# Patient Record
Sex: Female | Born: 1965 | Race: White | Hispanic: No | Marital: Single | Smoking: Former smoker
Health system: Southern US, Community
[De-identification: ages and names within clinical notes are randomized; demographics above are authoritative.]

## PROBLEM LIST (undated history)

## (undated) DIAGNOSIS — F419 Anxiety disorder, unspecified: Secondary | ICD-10-CM

## (undated) DIAGNOSIS — C801 Malignant (primary) neoplasm, unspecified: Secondary | ICD-10-CM

## (undated) DIAGNOSIS — N189 Chronic kidney disease, unspecified: Secondary | ICD-10-CM

## (undated) DIAGNOSIS — Z803 Family history of malignant neoplasm of breast: Secondary | ICD-10-CM

## (undated) DIAGNOSIS — N879 Dysplasia of cervix uteri, unspecified: Secondary | ICD-10-CM

## (undated) DIAGNOSIS — R87619 Unspecified abnormal cytological findings in specimens from cervix uteri: Secondary | ICD-10-CM

## (undated) DIAGNOSIS — Z1371 Encounter for nonprocreative screening for genetic disease carrier status: Secondary | ICD-10-CM

## (undated) DIAGNOSIS — E063 Autoimmune thyroiditis: Secondary | ICD-10-CM

## (undated) DIAGNOSIS — J45909 Unspecified asthma, uncomplicated: Secondary | ICD-10-CM

## (undated) DIAGNOSIS — T7840XA Allergy, unspecified, initial encounter: Secondary | ICD-10-CM

## (undated) DIAGNOSIS — Z800 Family history of malignant neoplasm of digestive organs: Secondary | ICD-10-CM

## (undated) DIAGNOSIS — E039 Hypothyroidism, unspecified: Secondary | ICD-10-CM

## (undated) DIAGNOSIS — J189 Pneumonia, unspecified organism: Secondary | ICD-10-CM

## (undated) HISTORY — DX: Dysplasia of cervix uteri, unspecified: N87.9

## (undated) HISTORY — DX: Malignant (primary) neoplasm, unspecified: C80.1

## (undated) HISTORY — DX: Hypothyroidism, unspecified: E03.9

## (undated) HISTORY — DX: Chronic kidney disease, unspecified: N18.9

## (undated) HISTORY — PX: COLPOSCOPY: SHX161

## (undated) HISTORY — DX: Family history of malignant neoplasm of digestive organs: Z80.0

## (undated) HISTORY — DX: Family history of malignant neoplasm of breast: Z80.3

## (undated) HISTORY — PX: ADENOIDECTOMY: SHX5191

## (undated) HISTORY — PX: CERVIX LESION DESTRUCTION: SHX591

## (undated) HISTORY — DX: Autoimmune thyroiditis: E06.3

## (undated) HISTORY — DX: Allergy, unspecified, initial encounter: T78.40XA

## (undated) HISTORY — DX: Encounter for nonprocreative screening for genetic disease carrier status: Z13.71

## (undated) HISTORY — DX: Unspecified asthma, uncomplicated: J45.909

## (undated) HISTORY — DX: Anxiety disorder, unspecified: F41.9

## (undated) HISTORY — DX: Unspecified abnormal cytological findings in specimens from cervix uteri: R87.619

## (undated) HISTORY — DX: Pneumonia, unspecified organism: J18.9

---

## 2000-04-22 ENCOUNTER — Encounter: Admission: RE | Admit: 2000-04-22 | Discharge: 2000-04-22 | Payer: Self-pay | Admitting: Internal Medicine

## 2000-04-22 ENCOUNTER — Encounter: Payer: Self-pay | Admitting: Internal Medicine

## 2000-12-05 ENCOUNTER — Encounter: Admission: RE | Admit: 2000-12-05 | Discharge: 2000-12-05 | Payer: Self-pay | Admitting: Cardiology

## 2000-12-05 ENCOUNTER — Encounter: Payer: Self-pay | Admitting: Family Medicine

## 2001-05-05 ENCOUNTER — Encounter: Admission: RE | Admit: 2001-05-05 | Discharge: 2001-05-05 | Payer: Self-pay | Admitting: Family Medicine

## 2001-05-05 ENCOUNTER — Encounter: Payer: Self-pay | Admitting: Family Medicine

## 2001-08-08 ENCOUNTER — Other Ambulatory Visit: Admission: RE | Admit: 2001-08-08 | Discharge: 2001-08-08 | Payer: Self-pay | Admitting: *Deleted

## 2003-06-04 ENCOUNTER — Encounter: Admission: RE | Admit: 2003-06-04 | Discharge: 2003-06-04 | Payer: Self-pay | Admitting: Family Medicine

## 2004-06-04 ENCOUNTER — Encounter: Admission: RE | Admit: 2004-06-04 | Discharge: 2004-06-04 | Payer: Self-pay | Admitting: Family Medicine

## 2004-11-06 ENCOUNTER — Other Ambulatory Visit: Admission: RE | Admit: 2004-11-06 | Discharge: 2004-11-06 | Payer: Self-pay | Admitting: Gynecology

## 2005-06-07 ENCOUNTER — Encounter: Admission: RE | Admit: 2005-06-07 | Discharge: 2005-06-07 | Payer: Self-pay | Admitting: Family Medicine

## 2005-08-04 ENCOUNTER — Emergency Department (HOSPITAL_COMMUNITY): Admission: EM | Admit: 2005-08-04 | Discharge: 2005-08-04 | Payer: Self-pay | Admitting: Emergency Medicine

## 2005-08-05 ENCOUNTER — Encounter: Payer: Self-pay | Admitting: Vascular Surgery

## 2005-08-05 ENCOUNTER — Ambulatory Visit (HOSPITAL_COMMUNITY): Admission: RE | Admit: 2005-08-05 | Discharge: 2005-08-05 | Payer: Self-pay | Admitting: Family Medicine

## 2005-08-07 ENCOUNTER — Encounter: Admission: RE | Admit: 2005-08-07 | Discharge: 2005-08-07 | Payer: Self-pay | Admitting: Orthopedic Surgery

## 2006-01-25 ENCOUNTER — Other Ambulatory Visit: Admission: RE | Admit: 2006-01-25 | Discharge: 2006-01-25 | Payer: Self-pay | Admitting: Gynecology

## 2006-07-29 ENCOUNTER — Encounter: Admission: RE | Admit: 2006-07-29 | Discharge: 2006-07-29 | Payer: Self-pay | Admitting: Family Medicine

## 2007-09-13 ENCOUNTER — Encounter: Admission: RE | Admit: 2007-09-13 | Discharge: 2007-09-13 | Payer: Self-pay | Admitting: Family Medicine

## 2007-10-07 DIAGNOSIS — Z1371 Encounter for nonprocreative screening for genetic disease carrier status: Secondary | ICD-10-CM

## 2007-10-07 HISTORY — DX: Encounter for nonprocreative screening for genetic disease carrier status: Z13.71

## 2007-10-23 ENCOUNTER — Other Ambulatory Visit: Admission: RE | Admit: 2007-10-23 | Discharge: 2007-10-23 | Payer: Self-pay | Admitting: Gynecology

## 2007-11-14 ENCOUNTER — Ambulatory Visit: Payer: Self-pay | Admitting: Gynecology

## 2008-09-11 ENCOUNTER — Ambulatory Visit: Payer: Self-pay | Admitting: Gynecology

## 2008-10-18 ENCOUNTER — Ambulatory Visit: Payer: Self-pay | Admitting: Gynecology

## 2010-03-06 ENCOUNTER — Ambulatory Visit: Payer: Self-pay | Admitting: Women's Health

## 2010-03-09 ENCOUNTER — Other Ambulatory Visit
Admission: RE | Admit: 2010-03-09 | Discharge: 2010-03-09 | Payer: Self-pay | Source: Home / Self Care | Admitting: Gynecology

## 2010-10-29 ENCOUNTER — Other Ambulatory Visit: Payer: Self-pay

## 2010-10-29 ENCOUNTER — Ambulatory Visit: Payer: Self-pay | Admitting: Women's Health

## 2010-10-29 DIAGNOSIS — E038 Other specified hypothyroidism: Secondary | ICD-10-CM | POA: Insufficient documentation

## 2010-10-29 DIAGNOSIS — R87619 Unspecified abnormal cytological findings in specimens from cervix uteri: Secondary | ICD-10-CM | POA: Insufficient documentation

## 2011-02-22 ENCOUNTER — Telehealth: Payer: Self-pay | Admitting: *Deleted

## 2011-02-22 NOTE — Telephone Encounter (Signed)
Pt called complaining of foul vaginal odor & yeast  and would like to know what could be done. Pt has OV on 02/24/11 to seen [place]. Pt informed she could try OTC to help relieve, but should keep appt. So [place] can run culture. Pt agrees

## 2011-02-24 ENCOUNTER — Encounter: Payer: Self-pay | Admitting: Women's Health

## 2011-02-24 ENCOUNTER — Ambulatory Visit (INDEPENDENT_AMBULATORY_CARE_PROVIDER_SITE_OTHER): Payer: BC Managed Care – PPO | Admitting: Women's Health

## 2011-02-24 DIAGNOSIS — A499 Bacterial infection, unspecified: Secondary | ICD-10-CM

## 2011-02-24 DIAGNOSIS — R1032 Left lower quadrant pain: Secondary | ICD-10-CM

## 2011-02-24 DIAGNOSIS — R82998 Other abnormal findings in urine: Secondary | ICD-10-CM

## 2011-02-24 DIAGNOSIS — B9689 Other specified bacterial agents as the cause of diseases classified elsewhere: Secondary | ICD-10-CM

## 2011-02-24 DIAGNOSIS — E039 Hypothyroidism, unspecified: Secondary | ICD-10-CM

## 2011-02-24 DIAGNOSIS — B3731 Acute candidiasis of vulva and vagina: Secondary | ICD-10-CM

## 2011-02-24 DIAGNOSIS — N760 Acute vaginitis: Secondary | ICD-10-CM

## 2011-02-24 DIAGNOSIS — B373 Candidiasis of vulva and vagina: Secondary | ICD-10-CM

## 2011-02-24 MED ORDER — METRONIDAZOLE 0.75 % VA GEL: VAGINAL | Status: AC

## 2011-02-24 MED ORDER — LEVOTHYROXINE SODIUM 200 MCG PO TABS: 200.0000 ug | ORAL_TABLET | Freq: Every day | ORAL | Status: DC

## 2011-02-24 MED ORDER — FLUCONAZOLE 150 MG PO TABS: 150.0000 mg | ORAL_TABLET | Freq: Once | ORAL | Status: AC

## 2011-02-24 NOTE — Progress Notes (Signed)
Patient ID: Samantha Golden, female   DOB: 10-24-1965, 45 y.o.   MRN: [id number] Presents with a complaint of discharge with an odor and irritation for greater than one week. Also states has had some left lower quadrant cramping. Denies any urinary symptoms, changes in elimination or fever. Light monthly cycle with a Mirena IUD. Same partner.  Exam: UA: 4-6 WBCs and +1 bacteria. External genitalia within normal limits, speculum exam scant amount of a watery discharge with an odor was noted. IUD strings visible. Bimanual no CMT or adnexal fullness or tenderness on the right, slight tenderness on the lower left quadrant. Wet prep positive for yeast, amines, clue cells.   BV  Plan: MetroGel vaginal cream 1 applicator at bedtime x5. Diflucan 150 by mouth x1 dose with a refill. Urine culture pending. Reviewed if low abdominal discomfort is not relieved after treating BV will return to the office for an ultrasound. Also instructed to call or return if discharge not resolved.

## 2011-03-08 ENCOUNTER — Other Ambulatory Visit: Payer: Self-pay | Admitting: Women's Health

## 2011-03-08 DIAGNOSIS — Z1231 Encounter for screening mammogram for malignant neoplasm of breast: Secondary | ICD-10-CM

## 2011-03-10 ENCOUNTER — Ambulatory Visit
Admission: RE | Admit: 2011-03-10 | Discharge: 2011-03-10 | Disposition: A | Discharge status: Home / Self Care | Payer: BC Managed Care – PPO | Source: Ambulatory Visit | Attending: Women's Health | Admitting: Women's Health

## 2011-03-10 DIAGNOSIS — Z1231 Encounter for screening mammogram for malignant neoplasm of breast: Secondary | ICD-10-CM

## 2011-04-28 ENCOUNTER — Telehealth: Payer: Self-pay

## 2011-04-28 NOTE — Telephone Encounter (Signed)
Pt requesting refill on her adderall  °

## 2011-04-28 NOTE — Telephone Encounter (Signed)
Can we refill pt's adderall?

## 2011-04-29 MED ORDER — AMPHETAMINE-DEXTROAMPHET ER 20 MG PO CP24: ORAL_CAPSULE | ORAL | Status: DC

## 2011-04-29 NOTE — Telephone Encounter (Signed)
I've authorized a 30 day supply, printed.  Please advise the patient that she'll need an OV for this for the next fill. csj

## 2011-04-29 NOTE — Telephone Encounter (Signed)
Chart pulled to clinical station

## 2011-04-29 NOTE — Telephone Encounter (Signed)
Chart please, then re-route to PA Pool.

## 2011-04-29 NOTE — Telephone Encounter (Signed)
Called pt and advised RX ready to pick up

## 2011-06-15 ENCOUNTER — Ambulatory Visit (INDEPENDENT_AMBULATORY_CARE_PROVIDER_SITE_OTHER): Payer: BC Managed Care – PPO | Admitting: Gynecology

## 2011-06-15 ENCOUNTER — Encounter: Payer: Self-pay | Admitting: Gynecology

## 2011-06-15 DIAGNOSIS — R229 Localized swelling, mass and lump, unspecified: Secondary | ICD-10-CM

## 2011-06-15 DIAGNOSIS — R2230 Localized swelling, mass and lump, unspecified upper limb: Secondary | ICD-10-CM

## 2011-06-15 NOTE — Progress Notes (Signed)
Patient presents having felt a bump in her right axilla today. No history of this previously. She just had her mammogram in January which was normal. She does have a strong family history of breast cancer in her maternal grandmother and mother. She was tested for BRCA and was negative historically. Has an IUD with scant to absent menses.  Exam was Sherrilyn Rist chaperone present Breasts examined lying and sitting without masses retractions discharge adenopathy. She's had a hard time demonstrating. She previously felt. The area she's pointing to is in the mid anterior pectoralis line where I do not feel any definitive masses but a small granular faint area of that I think is probably a small sebaceous change.  Assessment and plan: Right axillary nodule initially felt by patient today over she cannot clearly demonstrated now. I do not feel any distinct abnormalities but a faint granular intradermal type changes I think is a small sebaceous process. I recommended she put heat to this area and reassess in one to 2 weeks if she still feels any changes whatsoever to call me and we'll start with ultrasound of the axillary area. If it totally resolves and she cannot feel any changes and we'll follow expectantly she is comfortable with this.

## 2011-06-15 NOTE — Patient Instructions (Addendum)
Call if you think you still feel anything in the right axilla and we will schedule ultrasound.

## 2011-07-28 ENCOUNTER — Other Ambulatory Visit: Payer: Self-pay

## 2011-07-28 NOTE — Telephone Encounter (Signed)
adderoll refill  Please call [phone]  When ready for pick up

## 2011-07-29 MED ORDER — AMPHETAMINE-DEXTROAMPHET ER 20 MG PO CP24: ORAL_CAPSULE | ORAL | Status: DC

## 2011-07-29 NOTE — Telephone Encounter (Signed)
Adderall XR 20mg  1-2 QAM, QPM Chart at pa desk for review.  [id number]

## 2011-07-29 NOTE — Telephone Encounter (Signed)
Rx printed.  Please advise patient she needs OV for next fill.

## 2011-07-29 NOTE — Telephone Encounter (Signed)
Spoke to patient. She will pick up script.

## 2011-12-10 ENCOUNTER — Ambulatory Visit: Payer: Self-pay | Admitting: Family Medicine

## 2012-03-09 ENCOUNTER — Encounter: Payer: BC Managed Care – PPO | Admitting: Gynecology

## 2012-03-10 ENCOUNTER — Encounter: Payer: Self-pay | Admitting: Gynecology

## 2012-03-10 ENCOUNTER — Ambulatory Visit (INDEPENDENT_AMBULATORY_CARE_PROVIDER_SITE_OTHER): Payer: BC Managed Care – PPO | Admitting: Gynecology

## 2012-03-10 ENCOUNTER — Other Ambulatory Visit (HOSPITAL_COMMUNITY)
Admission: RE | Admit: 2012-03-10 | Discharge: 2012-03-10 | Disposition: A | Discharge status: Home / Self Care | Payer: BC Managed Care – PPO | Source: Ambulatory Visit | Attending: Gynecology | Admitting: Gynecology

## 2012-03-10 VITALS — BP 124/70 | Ht 66.0 in | Wt 162.0 lb

## 2012-03-10 DIAGNOSIS — Z13220 Encounter for screening for lipoid disorders: Secondary | ICD-10-CM

## 2012-03-10 DIAGNOSIS — N879 Dysplasia of cervix uteri, unspecified: Secondary | ICD-10-CM | POA: Insufficient documentation

## 2012-03-10 DIAGNOSIS — E039 Hypothyroidism, unspecified: Secondary | ICD-10-CM

## 2012-03-10 DIAGNOSIS — Z1151 Encounter for screening for human papillomavirus (HPV): Secondary | ICD-10-CM | POA: Insufficient documentation

## 2012-03-10 DIAGNOSIS — Z01419 Encounter for gynecological examination (general) (routine) without abnormal findings: Secondary | ICD-10-CM

## 2012-03-10 LAB — CBC WITH DIFFERENTIAL/PLATELET
Basophils Relative: 0 % (ref 0–1)
Eosinophils Absolute: 0.4 10*3/uL (ref 0.0–0.7)
Eosinophils Relative: 4 % (ref 0–5)
HCT: 47.4 % — ABNORMAL HIGH (ref 36.0–46.0)
Lymphocytes Relative: 22 % (ref 12–46)
Lymphs Abs: 2 10*3/uL (ref 0.7–4.0)
MCV: 99.6 fL (ref 78.0–100.0)
Monocytes Absolute: 0.7 10*3/uL (ref 0.1–1.0)
Neutro Abs: 6.1 10*3/uL (ref 1.7–7.7)
Neutrophils Relative %: 66 % (ref 43–77)
RDW: 13.4 % (ref 11.5–15.5)

## 2012-03-10 LAB — COMPREHENSIVE METABOLIC PANEL
ALT: 16 U/L (ref 0–35)
Albumin: 4.6 g/dL (ref 3.5–5.2)
BUN: 11 mg/dL (ref 6–23)
CO2: 28 mEq/L (ref 19–32)
Calcium: 9.4 mg/dL (ref 8.4–10.5)
Creat: 0.72 mg/dL (ref 0.50–1.10)
Glucose, Bld: 64 mg/dL — ABNORMAL LOW (ref 70–99)
Sodium: 139 mEq/L (ref 135–145)
Total Bilirubin: 0.4 mg/dL (ref 0.3–1.2)

## 2012-03-10 LAB — LIPID PANEL
Cholesterol: 250 mg/dL — ABNORMAL HIGH (ref 0–200)
Total CHOL/HDL Ratio: 3.8 Ratio
VLDL: 41 mg/dL — ABNORMAL HIGH (ref 0–40)

## 2012-03-10 NOTE — Progress Notes (Signed)
Samantha Golden 01-Jul-1965 [id number]        47 y.o.  G3P3003 for annual exam.  Several issues below.  Past medical history,surgical history, medications, allergies, family history and social history were all reviewed and documented in the EPIC chart. ROS:  Was performed and pertinent positives and negatives are included in the history.  Exam: Kim assistant Filed Vitals:   03/10/12 1610  BP: 124/70  Height: 5\' 6"  (1.676 m)  Weight: 162 lb (73.483 kg)   General appearance  Normal Skin grossly normal Head/Neck normal with no cervical or supraclavicular adenopathy thyroid normal Lungs  clear Cardiac RR, without RMG Abdominal  soft, nontender, without masses, organomegaly or hernia Breasts  examined lying and sitting without masses, retractions, discharge or axillary adenopathy. Pelvic  Ext/BUS/vagina  normal   Cervix  normal with IUD string visualized. Pap/HPV done  Uterus  anteverted, normal size, shape and contour, midline and mobile nontender   Adnexa  Without masses or tenderness    Anus and perineum  normal   Rectovaginal  normal sphincter tone without palpated masses or tenderness.    Assessment/Plan:  47 y.o. G46P3003 female for annual exam.   1. Family history of breast cancer. Patient with very strong family history of breast cancer to include her mother at age 62, maternal grandmother in her 61s and a paternal aunt all with breast cancer. The patient did have BRCA1 and 2 screening and was negative.  Mammography January 2013. Continue with annual mammography. SBE monthly reviewed.  Consider 3D tomosynthesis. 2. Pap smear January 2012. Pap/HPV done today. History of dysplasia number of years ago with normal recent Pap smears over the last number of years documented from 2003 in her chart. 3. Mirena IUD 09/2008. Without menses.  Significant weight gain hair changes and dry skin particularly over the past year she is wondering if it is IUD related. Also being followed for  hypothyroidism. Will check TSH. Options to remove the IUD and observe versus continuing IUD discussed. Alternatives for contraception to include combined hormonal Implanon Depo-Provera nonhormonal IUD sterilization post vasectomy tubal and Essure all discussed. She did have heavy painful periods previously and is fearful that these will return if she has the IUD removed. She is unsure what she wants to do at this point and wants to leave the IUD in place for now. 4. Hypothyroidism. Check TSH today. 5. Health maintenance. Check baseline CBC comprehensive metabolic panel TSH lipid profile urinalysis.    Dara Lords MD, 4:56 PM 03/10/2012

## 2012-03-10 NOTE — Addendum Note (Signed)
Addended by: Dayna Barker on: 03/10/2012 05:04 PM   Modules accepted: Orders

## 2012-03-10 NOTE — Patient Instructions (Addendum)
Follow up for lab work and your decision about the IUD.

## 2012-03-11 LAB — URINALYSIS W MICROSCOPIC + REFLEX CULTURE
Bilirubin Urine: NEGATIVE
Casts: NONE SEEN
Glucose, UA: NEGATIVE mg/dL
Specific Gravity, Urine: 1.017 (ref 1.005–1.030)

## 2012-03-13 ENCOUNTER — Encounter: Payer: Self-pay | Admitting: Gynecology

## 2012-03-15 ENCOUNTER — Ambulatory Visit (INDEPENDENT_AMBULATORY_CARE_PROVIDER_SITE_OTHER): Payer: BC Managed Care – PPO | Admitting: Women's Health

## 2012-03-15 DIAGNOSIS — Z30431 Encounter for routine checking of intrauterine contraceptive device: Secondary | ICD-10-CM

## 2012-03-15 DIAGNOSIS — E039 Hypothyroidism, unspecified: Secondary | ICD-10-CM

## 2012-03-15 MED ORDER — LEVOTHYROXINE SODIUM 25 MCG PO TABS: 25.0000 ug | ORAL_TABLET | Freq: Every day | ORAL | Status: DC

## 2012-03-15 MED ORDER — LEVOTHYROXINE SODIUM 200 MCG PO TABS: 200.0000 ug | ORAL_TABLET | Freq: Every day | ORAL | Status: AC

## 2012-03-15 NOTE — Progress Notes (Signed)
Patient ID: Samantha Golden, female   DOB: [birth year], 47 y.o.   MRN: [id number] Presents to have Mirena IUD removed has been in about 3-1/2 years. States has had increased weight gain and not felt as well. Rare light cycles. Currently on Synthroid 200 mcg, TSH 90.174 at annual exam last week. States missed several days. Lipid panel, cholesterol 250, triglycerides 2 or 3, HDL 65 and LDL 144. Partner 37 and does not want vasectomy.  Exam: Appears well, external genitalia within normal limits. Speculum exam cervix parous IUD strings visible, IUD removed intact shown to patient and discarded.  Contraception counseling Hypothyroid/? Compliance Hypercholesteremia  Plan: Labs reviewed.Will increase Synthroid to daily, recheck thyroid panel in 6 weeks. Reviewed importance of taking daily. Reviewed hypothyroidism can cause weight gain, skin changes and some of a problems she has had. States still wanted IUD removed. Also discussed lipid panel. Reviewed importance of increasing regular exercise, decreasing saturated fats to less than 20 g per day, adding a fish oil supplement and rechecking in 4-6 months after lifestyle changes. ParaGard IUD information given and reviewed slight risk for hemorrhage, infection, perforation. Reviewed it is hormone free, Dr. Audie Box will place with a cycle, condoms until. BTL's also discussed.

## 2012-08-16 ENCOUNTER — Telehealth: Payer: Self-pay | Admitting: Gynecology

## 2012-08-16 ENCOUNTER — Other Ambulatory Visit: Payer: Self-pay | Admitting: Gynecology

## 2012-08-16 ENCOUNTER — Other Ambulatory Visit: Payer: Self-pay

## 2012-08-16 DIAGNOSIS — Z3049 Encounter for surveillance of other contraceptives: Secondary | ICD-10-CM

## 2012-08-16 NOTE — Telephone Encounter (Signed)
08/16/12-Pt was informed that her [facility] ins covers the Paraguard IUD & insertion under the $81.00 copay. Appt set up with TF for insertion on 09/06/12-WL

## 2012-08-31 ENCOUNTER — Ambulatory Visit (INDEPENDENT_AMBULATORY_CARE_PROVIDER_SITE_OTHER): Payer: BC Managed Care – PPO | Admitting: Gynecology

## 2012-08-31 ENCOUNTER — Encounter: Payer: Self-pay | Admitting: Gynecology

## 2012-08-31 DIAGNOSIS — Z30431 Encounter for routine checking of intrauterine contraceptive device: Secondary | ICD-10-CM

## 2012-08-31 NOTE — Patient Instructions (Signed)
Intrauterine Device Insertion Most often, an intrauterine device (IUD) is inserted into the uterus to prevent pregnancy. There are 2 types of IUDs available:  Copper IUD. This type of IUD creates an environment that is not favorable to sperm survival. The mechanism of action of the copper IUD is not known for certain. It can stay in place for 10 years.  Hormone IUD. This type of IUD contains the hormone progestin (synthetic progesterone). The progestin thickens the cervical mucus and prevents sperm from entering the uterus, and it also thins the uterine lining. There is no evidence that the hormone IUD prevents implantation. The hormone IUD can stay in place for up to 5 years. An IUD is the most cost-effective birth control if left in place for the full duration. It may be removed at any time. LET YOUR CAREGIVER KNOW ABOUT:  Sensitivity to metals.  Medicines taken including herbs, eyedrops, over-the-counter medicines, and creams.  Use of steroids (by mouth or creams).  Previous problems with anesthetics or numbing medicine.  Previous gynecological surgery.  History of blood clots or clotting disorders.  Possibility of pregnancy.  Menstrual irregularities.  Concerns regarding unusual vaginal discharge or odors.  Previous experience with an IUD.  Other health problems. RISKS AND COMPLICATIONS  Accidental puncture (perforation) of the uterus.  Accidental placement of the IUD either in the muscle layer of the uterus (myometrium) or outside the uterus. If this happen, the IUD can be found essentially floating around the bowels. When this happens, the IUD must be taken out surgically.  The IUD may fall out of the uterus (expulsion). This is more common in women who have recently had a child.   Pregnancy in the fallopian tube (ectopic). BEFORE THE PROCEDURE  Schedule the IUD insertion for when you will have your menstrual period or right after, to make sure you are not pregnant.  Placement of the IUD is better tolerated shortly after a menstrual cycle.  You may need to take tests or be examined to make sure you are not pregnant.  You may be required to take a pregnancy test.  You may be required to get checked for sexually transmitted infections (STIs) prior to placement. Placing an IUD in someone who has an infection can make an infection worse.  You may be given a pain reliever to take 1 or 2 hours before the procedure.  An exam will be performed to determine the size and position of your uterus.  Ask your caregiver about changing or stopping your regular medicines. PROCEDURE   A tool (speculum) is placed in the vagina. This allows your caregiver to see the lower part of the uterus (cervix).  The cervix is prepped with a medicine that lowers the risk of infection.  You may be given a medicine to numb each side of the cervix (intracervical or paracervical block). This is used to block and control any discomfort with insertion.  A tool (uterine sound) is inserted into the uterus to determine the length of the uterine cavity and the direction the uterus may be tilted.  A slim instrument (IUD inserter) is inserted through the cervical canal and into your uterus.  The IUD is placed in the uterine cavity and the insertion device is removed.  The nylon string that is attached to the IUD, and used for eventual IUD removal, is trimmed. It is trimmed so that it lays high in the vagina, just outside the cervix. AFTER THE PROCEDURE  You may have bleeding after the   procedure. This is normal. It varies from light spotting for a few days to menstrual-like bleeding.  You may have mild cramping.  Practice checking the string coming out of the cervix to make sure the IUD remains in the uterus. If you cannot feel the string, you should schedule a "string check" with your caregiver.  If you had a hormone IUD inserted, expect that your period may be lighter or nonexistent  within a year's time (though this is not always the case). There may be delayed fertility with the hormone IUD as a result of its progesterone effect. When you are ready to become pregnant, it is suggested to have the IUD removed up to 1 year in advance.  Yearly exams are advised. Document Released: 10/21/2010 Document Revised: 05/17/2011 Document Reviewed: 10/21/2010 ExitCare Patient Information 2014 [employer].  

## 2012-08-31 NOTE — Progress Notes (Signed)
Patient presents for ParaGard IUD placement. She had her Mirena IUD removed by Harriett Sine due to suspected side effects. She does note that her sleep disturbances and weight gain seemed to be better. She does understand that her periods may be heavier and accepts this. She ultimately has elected for a ParaGard IUD. She has read through the booklet, has no contraindications. She currently is on a normal menses.  I reviewed the insertional process with her as well as the risks to include infection either immediate or long-term, uterine perforation or migration requiring surgery to remove, other complications such as pain and heavier menses and possibilities of failure with subsequent pregnancy.   Exam with  Kim assistant Pelvic: External BUS vagina normal. Cervix normal  with menses flow. Uterus  anteverted normal size shape contour midline mobile nontender. Adnexa without masses or tenderness.  Procedure: The cervix was cleansed with Betadine, anterior lip grasped with a single-tooth tenaculum, the uterus was sounded and a  ParaGard  was placed according to manufacturer's recommendations without difficulty. The strings were trimmed. The patient tolerated well and will follow up in one month for a postinsertional check.  Lot number:  [id number]

## 2012-09-06 ENCOUNTER — Ambulatory Visit: Payer: BC Managed Care – PPO | Admitting: Gynecology

## 2012-10-04 ENCOUNTER — Encounter: Payer: Self-pay | Admitting: Gynecology

## 2012-10-04 ENCOUNTER — Ambulatory Visit (INDEPENDENT_AMBULATORY_CARE_PROVIDER_SITE_OTHER): Payer: BC Managed Care – PPO | Admitting: Gynecology

## 2012-10-04 DIAGNOSIS — Z30431 Encounter for routine checking of intrauterine contraceptive device: Secondary | ICD-10-CM

## 2012-10-04 NOTE — Progress Notes (Signed)
Patient presents for IUD followup exam. Had ParaGard placed a month ago. Did have one menses which was heavy.  Exam was Berenice Bouton Abdomen soft nontender without masses guarding rebound organomegaly. Pelvic external BUS vagina normal. Cervix normal with IUD string visualized an appropriate length. Uterus normal size midline mobile nontender. Adnexa without masses or tenderness.  Assessment and plan: Normal IUD check. Patient will keep menstrual calendar. As long as acceptable then followup in January when she's due for her annual exam. Sooner if any issues.

## 2012-10-04 NOTE — Patient Instructions (Signed)
Followup in January 2015 for your annual exam. Sooner if any issues.

## 2012-11-22 ENCOUNTER — Other Ambulatory Visit: Payer: Self-pay | Admitting: Women's Health

## 2012-11-23 NOTE — Telephone Encounter (Signed)
Message left, I think she is on 225 mcg of Synthroid daily. Needs TSH also.

## 2012-11-23 NOTE — Telephone Encounter (Signed)
Left message at [facility] that [place] needs to speak with patient before refilling this.

## 2012-11-23 NOTE — Telephone Encounter (Signed)
Samantha Golden, Looks to me like you recommended she recheck TSH 6 weeks after her Jan 8 visit but I do not see where she ever did that.

## 2012-11-24 NOTE — Telephone Encounter (Signed)
Telephone call to patient she is on 225, 25 mcg pill needed refill she still has refills on the 200.

## 2013-04-18 ENCOUNTER — Ambulatory Visit: Payer: BC Managed Care – PPO | Admitting: Internal Medicine

## 2013-04-26 ENCOUNTER — Other Ambulatory Visit: Payer: Self-pay | Admitting: Women's Health

## 2013-04-27 ENCOUNTER — Other Ambulatory Visit: Payer: Self-pay | Admitting: Women's Health

## 2013-04-27 ENCOUNTER — Telehealth: Payer: Self-pay

## 2013-04-27 MED ORDER — LEVOTHYROXINE SODIUM 25 MCG PO TABS: ORAL_TABLET | ORAL | Status: DC

## 2013-04-27 NOTE — Telephone Encounter (Signed)
Patient informed. She declined being transferred to appt desk to schedule as she was in her car. I told her very important that she not forget to call back because this is last Rx refill. Overdue for annual and never had TSH rechecked last year to be sure dose correct.

## 2013-04-27 NOTE — Telephone Encounter (Signed)
Patient called because refill for Synthroid was denied.  You saw her for CE 03/11/12 so she is overdue for that but also on 03/15/12 Samantha Golden started her on Synthroid and told her to return in six weeks to recheck TSH and that was never done.  Patient said Samantha Golden would not want her to not be on it and she will schedule appointment if needed.  OK to refill until she can schedule CE?

## 2013-04-27 NOTE — Telephone Encounter (Signed)
rx sent

## 2013-04-27 NOTE — Telephone Encounter (Signed)
30 day supply only.  Needs to be seen within that time and we have plenty of openings.

## 2013-05-03 ENCOUNTER — Telehealth: Payer: Self-pay

## 2013-05-03 NOTE — Telephone Encounter (Signed)
Dr.Doolittle, Patient called stating she tried to make an appointment with him, She will like first appointment to see Dr. Laney Pastor for medication refill. [phone], Patient will like to a nurse to call or if Dr. Laney Pastor is unavailable she will see another doctor for her refill medication

## 2013-05-04 ENCOUNTER — Other Ambulatory Visit: Payer: Self-pay | Admitting: *Deleted

## 2013-05-04 NOTE — Telephone Encounter (Signed)
Pt stated that she needs a refill on her Adderall and she says her appt would be in April.  I advised her to call back on Monday to schedule appt but will you write her enough to last til she get in.

## 2013-05-07 NOTE — Telephone Encounter (Signed)
She has no note from [place] in epic We have not prescribed this for her and so can't refill it I suggest you make an appt for 1st available provider at [facility] who is willing to prescribe for her

## 2013-05-08 NOTE — Telephone Encounter (Signed)
Discussed with pt. She will call back to schedule appt with the first available provider.

## 2013-05-20 ENCOUNTER — Other Ambulatory Visit: Payer: Self-pay | Admitting: Women's Health

## 2013-05-21 NOTE — Telephone Encounter (Signed)
Pt due for annual exam she will be called to schedule. KW CMA

## 2013-06-28 ENCOUNTER — Ambulatory Visit: Payer: BC Managed Care – PPO

## 2013-06-28 ENCOUNTER — Ambulatory Visit (INDEPENDENT_AMBULATORY_CARE_PROVIDER_SITE_OTHER): Payer: BC Managed Care – PPO | Admitting: Family Medicine

## 2013-06-28 ENCOUNTER — Ambulatory Visit: Payer: Self-pay

## 2013-06-28 ENCOUNTER — Encounter: Payer: Self-pay | Admitting: Family Medicine

## 2013-06-28 VITALS — BP 120/82 | HR 62 | Temp 97.9°F | Resp 16 | Ht 66.0 in | Wt 154.0 lb

## 2013-06-28 DIAGNOSIS — M25511 Pain in right shoulder: Secondary | ICD-10-CM

## 2013-06-28 DIAGNOSIS — F900 Attention-deficit hyperactivity disorder, predominantly inattentive type: Secondary | ICD-10-CM

## 2013-06-28 DIAGNOSIS — F988 Other specified behavioral and emotional disorders with onset usually occurring in childhood and adolescence: Secondary | ICD-10-CM

## 2013-06-28 DIAGNOSIS — M25519 Pain in unspecified shoulder: Secondary | ICD-10-CM

## 2013-06-28 MED ORDER — AMPHETAMINE-DEXTROAMPHET ER 20 MG PO CP24: ORAL_CAPSULE | ORAL | Status: DC

## 2013-06-28 MED ORDER — IBUPROFEN 600 MG PO TABS: 600.0000 mg | ORAL_TABLET | Freq: Three times a day (TID) | ORAL | Status: DC | PRN

## 2013-06-28 MED ORDER — TRAMADOL HCL 50 MG PO TABS: ORAL_TABLET | ORAL | Status: DC

## 2013-06-28 NOTE — Patient Instructions (Signed)
   Shoulder Pain The shoulder is the joint that connects your arms to your body. The bones that form the shoulder joint include the upper arm bone (humerus), the shoulder blade (scapula), and the collarbone (clavicle). The top of the humerus is shaped like a ball and fits into a rather flat socket on the scapula (glenoid cavity). A combination of muscles and strong, fibrous tissues that connect muscles to bones (tendons) support your shoulder joint and hold the ball in the socket. Small, fluid-filled sacs (bursae) are located in different areas of the joint. They act as cushions between the bones and the overlying soft tissues and help reduce friction between the gliding tendons and the bone as you move your arm. Your shoulder joint allows a wide range of motion in your arm. This range of motion allows you to do things like scratch your back or throw a ball. However, this range of motion also makes your shoulder more prone to pain from overuse and injury. Causes of shoulder pain can originate from both injury and overuse and usually can be grouped in the following four categories:  Redness, swelling, and pain (inflammation) of the tendon (tendinitis) or the bursae (bursitis).  Instability, such as a dislocation of the joint.  Inflammation of the joint (arthritis).  Broken bone (fracture). HOME CARE INSTRUCTIONS   Apply ice to the sore area.  Put ice in a plastic bag.  Place a towel between your skin and the bag.  Leave the ice on for 15-20 minutes, 03-04 times per day for the first 2 days.  Stop using cold packs if they do not help with the pain.  If you have a shoulder sling or immobilizer, wear it as long as your caregiver instructs. Only remove it to shower or bathe. Move your arm as little as possible, but keep your hand moving to prevent swelling.  Squeeze a soft ball or foam pad as much as possible to help prevent swelling.  Only take over-the-counter or prescription medicines for  pain, discomfort, or fever as directed by your caregiver. SEEK MEDICAL CARE IF:   Your shoulder pain increases, or new pain develops in your arm, hand, or fingers.  Your hand or fingers become cold and numb.  Your pain is not relieved with medicines. SEEK IMMEDIATE MEDICAL CARE IF:   Your arm, hand, or fingers are numb or tingling.  Your arm, hand, or fingers are significantly swollen or turn white or blue. MAKE SURE YOU:   Understand these instructions.  Will watch your condition.  Will get help right away if you are not doing well or get worse. Document Released: 12/02/2004 Document Revised: 11/17/2011 Document Reviewed: 02/06/2011 ExitCare Patient Information 2014 [employer].  

## 2013-06-28 NOTE — Progress Notes (Signed)
S:  This 48 y.o. Cauc female is here to resume medication for Adult ADD, diagnosed ~ 6 years ago. She was evaluated by Dr. Greggory Brandy in 2005. Dr. Laney Pastor prescribed Adderall initially; pt usually took 2 capsules in the morning and evening dose as needed. She went off the medication > 1 year ago but has trouble w/ focus and concentration and task completion. 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  Pt c/o 1 month hx of R shoulder pain. She is right-handed and does not recall trauma. May have "slept wrong". Feels popping or grinding sensation in shoulder. Has decreased ROM and pain; cannot lift objects of any weight in front of her and cannot lift arm much above shoulder height. She gets temporary relief w/ Ibuprofen.   Pt has Hypothyroidism; medication is managed by GYN. She has an appt next month.  Patient Active Problem List   Diagnosis Date Noted  . 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 Abnormal cervical Papanicolaou smear   . Hypothyroidism    PMHX, Surg Hx, Soc and Fam HX reviewed.  MEDICATIONS reconciled.  ROS: AS per HPI; Negative for muscle weakness or paresthesias.   O: Filed Vitals:   06/28/13 0846  BP: 120/82  Pulse: 62  Temp: 97.9 F (36.6 C)  Resp: 16   GEN: In NAD; WN,WD. HENT: [place]/AT; EOMI w/ clear conj/sclerae. Otherwise unremarkable. NECK: Supple w/o LAN or TMG. Passive ROM normal. No muscle spasms. BACK: No CVAT; spine straight w/o bony deformity or pain, paravertebral muscle spasm or tenderness. R SHOULDER: Decreased ROM w/ abduction to 90 degrees w/ ease. Internal rotation limited; tender to palpation over mid-spine of scapula, posterior aspect of deltoid and w/ deep palpation in axilla. Crepitus appreciated. NEURO: A&O x 3; CNs intact. Nonfocal.    [facility] reading (PRIMARY) by  Dr. Leward Quan: R shoulder- Slight irregularity of head of humerus; no fracture or dislocation.  A/P: ADHD (attention deficit hyperactivity disorder),  inattentive type- Resume Adderall XR 20 mg  1-2 capsules every morning and 1 capsule in evening. RXs for 3 months given. Pt to contact office for refills in 3 months; schedule OV in 6 months or sooner if problems occur and/or medication adjustment needed.  Shoulder pain, right - Plan: DG Shoulder Right. RX: Ibuprofen 600 mg 1 tab every 6-8 hours prn pain. RX: Tramadol 50 mg 1 tablet hs for severe pain.   Ambulatory referral to Orthopedic Surgery (pt has seen Dr. Ronnie Derby in the past).  Meds ordered this encounter  Medications  . DISCONTD: amphetamine-dextroamphetamine (ADDERALL XR) 20 MG 24 hr capsule    Sig: Take 1-2 PO QAM, 1 PO QPM    Dispense:  90 capsule    Refill:  0  . DISCONTD: amphetamine-dextroamphetamine (ADDERALL XR) 20 MG 24 hr capsule    Sig: Take 1-2 PO QAM, 1 PO QPM    Dispense:  90 capsule    Refill:  0    May fill on or after Jul 28, 2013.  Marland Kitchen amphetamine-dextroamphetamine (ADDERALL XR) 20 MG 24 hr capsule    Sig: Take 1-2 PO QAM, 1 PO QPM    Dispense:  90 capsule    Refill:  0    May fill on or after August 28, 2013.  . ibuprofen (ADVIL,MOTRIN) 600 MG tablet    Sig: Take 1 tablet (600 mg total) by mouth every 8 (eight) hours as needed.    Dispense:  40 tablet    Refill:  0  . traMADol (ULTRAM) 50  MG tablet    Sig: Take 1 tablet at bedtime prn severe pain.    Dispense:  30 tablet    Refill:  0

## 2013-06-29 ENCOUNTER — Telehealth: Payer: Self-pay

## 2013-06-29 NOTE — Telephone Encounter (Signed)
[place] approved for Adderall (generic) ER 20 mg #90 for 30 days, case # [id number]. Notified pharm and pt.

## 2013-06-29 NOTE — Telephone Encounter (Signed)
Spoke to [place].  She is working on the prior [place] now but it may take a few days to get the result.  Tried to call patient.  No answer.  LMVM to CB.

## 2013-06-29 NOTE — Telephone Encounter (Signed)
Patient is calling to see if the Prior Auth for her prescription has been completed.  Please call and advise status.   [phone]

## 2013-07-18 ENCOUNTER — Encounter: Payer: BC Managed Care – PPO | Admitting: Women's Health

## 2013-07-18 ENCOUNTER — Telehealth: Payer: Self-pay | Admitting: *Deleted

## 2013-07-18 MED ORDER — LEVOTHYROXINE SODIUM 200 MCG PO TABS
ORAL_TABLET | ORAL | Status: DC
Start: 2013-07-18 — End: 2013-10-15

## 2013-07-18 NOTE — Telephone Encounter (Signed)
Pt has annual scheduled but canceled do to transportation issues needs refill on synthroid 200 mcg rx sent for 30 day supply

## 2013-10-15 ENCOUNTER — Other Ambulatory Visit: Payer: Self-pay | Admitting: Women's Health

## 2013-10-15 NOTE — Telephone Encounter (Signed)
Ok to fill but tell her she must keep her appointment.

## 2013-10-15 NOTE — Telephone Encounter (Signed)
Samantha Golden, Last Jan her TSH was very high. You prescribed new dose and told her to return in 6 weeks for recheck. She did not. She is 8 mos overdue for ce but is scheduled in August.

## 2013-10-23 ENCOUNTER — Ambulatory Visit (INDEPENDENT_AMBULATORY_CARE_PROVIDER_SITE_OTHER): Payer: BC Managed Care – PPO | Admitting: Women's Health

## 2013-10-23 ENCOUNTER — Encounter: Payer: Self-pay | Admitting: Women's Health

## 2013-10-23 VITALS — BP 122/79 | Ht 66.0 in | Wt 160.2 lb

## 2013-10-23 DIAGNOSIS — Z13220 Encounter for screening for lipoid disorders: Secondary | ICD-10-CM

## 2013-10-23 DIAGNOSIS — Z01419 Encounter for gynecological examination (general) (routine) without abnormal findings: Secondary | ICD-10-CM

## 2013-10-23 DIAGNOSIS — E039 Hypothyroidism, unspecified: Secondary | ICD-10-CM

## 2013-10-23 DIAGNOSIS — N946 Dysmenorrhea, unspecified: Secondary | ICD-10-CM

## 2013-10-23 LAB — LIPID PANEL
CHOL/HDL RATIO: 2.4 ratio
Cholesterol: 219 mg/dL — ABNORMAL HIGH (ref 0–200)
HDL: 91 mg/dL (ref >39)
LDL Cholesterol: 116 mg/dL — ABNORMAL HIGH (ref 0–99)
TRIGLYCERIDES: 58 mg/dL (ref <150)
VLDL: 12 mg/dL (ref 0–40)

## 2013-10-23 LAB — COMPREHENSIVE METABOLIC PANEL
ALK PHOS: 43 U/L (ref 39–117)
ALT: 21 U/L (ref 0–35)
AST: 33 U/L (ref 0–37)
Albumin: 4.1 g/dL (ref 3.5–5.2)
BUN: 10 mg/dL (ref 6–23)
CALCIUM: 9 mg/dL (ref 8.4–10.5)
CHLORIDE: 100 meq/L (ref 96–112)
CO2: 26 mEq/L (ref 19–32)
CREATININE: 0.68 mg/dL (ref 0.50–1.10)
GLUCOSE: 85 mg/dL (ref 70–99)
POTASSIUM: 3.9 meq/L (ref 3.5–5.3)
Sodium: 134 mEq/L — ABNORMAL LOW (ref 135–145)
Total Bilirubin: 0.4 mg/dL (ref 0.2–1.2)
Total Protein: 6.8 g/dL (ref 6.0–8.3)

## 2013-10-23 LAB — CBC WITH DIFFERENTIAL/PLATELET
BASOS PCT: 1 % (ref 0–1)
Basophils Absolute: 0.1 10*3/uL (ref 0.0–0.1)
EOS PCT: 3 % (ref 0–5)
Eosinophils Absolute: 0.2 10*3/uL (ref 0.0–0.7)
HEMATOCRIT: 43 % (ref 36.0–46.0)
HEMOGLOBIN: 14.5 g/dL (ref 12.0–15.0)
Lymphocytes Relative: 20 % (ref 12–46)
Lymphs Abs: 1.2 10*3/uL (ref 0.7–4.0)
MCH: 33.3 pg (ref 26.0–34.0)
MCHC: 33.7 g/dL (ref 30.0–36.0)
MCV: 98.6 fL (ref 78.0–100.0)
MONO ABS: 0.4 10*3/uL (ref 0.1–1.0)
MONOS PCT: 7 % (ref 3–12)
Neutro Abs: 4.1 10*3/uL (ref 1.7–7.7)
Neutrophils Relative %: 69 % (ref 43–77)
Platelets: 256 10*3/uL (ref 150–400)
RBC: 4.36 MIL/uL (ref 3.87–5.11)
RDW: 14.1 % (ref 11.5–15.5)
WBC: 6 10*3/uL (ref 4.0–10.5)

## 2013-10-23 MED ORDER — IBUPROFEN 600 MG PO TABS: 600.0000 mg | ORAL_TABLET | Freq: Three times a day (TID) | ORAL | Status: DC | PRN

## 2013-10-23 MED ORDER — LEVOTHYROXINE SODIUM 25 MCG PO TABS: ORAL_TABLET | ORAL | Status: DC

## 2013-10-23 MED ORDER — LEVOTHYROXINE SODIUM 200 MCG PO TABS: ORAL_TABLET | ORAL | Status: DC

## 2013-10-23 NOTE — Progress Notes (Signed)
Samantha Golden 1965/10/08 [id number]    History:    Presents for annual exam.  Cycles every 3-4 weeks for 5-6 days, 3 of which are very heavy with cramps. Has had to leave work due to the heavy flow. Had Mirena IUD removed and ParaGard placed 03/2012. Regrets having IUD change and would like to have Mirena again. Had thought the Mirena had caused weight gain. Hypothyroid on Synthroid 225, has been off Synthroid for over one month, out of refills. Mother breast cancer age 46, maternal grandmother breast cancer late 46s, patient BRCA negative. Pap abnormal with questionable treatment/cryo when in 20's, normal after. Mammogram history normal.  Past medical history, past surgical history, family history and social history were all reviewed and documented in the EPIC chart. 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  ROS:  A  12 point ROS was performed and pertinent positives and negatives are included.  Exam:  Filed Vitals:   10/23/13 1005  BP: 122/79    General appearance:  Normal Thyroid:  Symmetrical, normal in size, without palpable masses or nodularity. Respiratory  Auscultation:  Clear without wheezing or rhonchi Cardiovascular  Auscultation:  Regular rate, without rubs, murmurs or gallops  Edema/varicosities:  Not grossly evident Abdominal  Soft,nontender, without masses, guarding or rebound.  Liver/spleen:  No organomegaly noted  Hernia:  None appreciated  Skin  Inspection:  Grossly normal   Breasts: Examined lying and sitting.     Right: Without masses, retractions, discharge or axillary adenopathy.     Left: Without masses, retractions, discharge or axillary adenopathy. Gentitourinary   Inguinal/mons:  Normal without inguinal adenopathy  External genitalia:  Normal  BUS/Urethra/Skene's glands:  Normal  Vagina:  Normal  Cervix:  Normal IUD strings visible  Uterus:  normal in size, shape and contour.  Midline and mobile  Adnexa/parametria:      Rt: Without masses or tenderness.   Lt: Without masses or tenderness.  Anus and perineum: Normal  Digital rectal exam: Normal sphincter tone without palpated masses or tenderness  Assessment/Plan:  48 y.o.DWF G3P3  for annual exam.    ParaGard  IUD with dysmenorrhea and menorrhagia Hypothyroid on Synthroid 1 month Mother, MGM- breast cancer both late 30s/BRCA negative  Plan: Options reviewed will check Mirena IUD coverage, Dr. Phineas Real to remove ParaGard and replace with Mirena. Motrin 600 every 8 hours as needed. SBE's, overdue for mammogram reviewed importance of annual screen, 3-D tomography reviewed and encouraged history of dense breasts. Synthroid 225 mcg prescription, proper use given and reviewed reviewed importance of taking daily and to call office if out of refills. Continue healthy routine of diet and exercise, biking, calcium rich diet. CBC, lipid panel, TSH, T3, T4,  thyroid antibodies, UA. Pap normal with negative HR HPV 2014, new screening guidelines reviewed..  Note: This dictation was prepared with Dragon/digital dictation.  Any transcriptional errors that result are unintentional. Huel Cote [facility], 11:40 AM 10/23/2013

## 2013-10-23 NOTE — Patient Instructions (Signed)
Hypothyroidism The thyroid is a large gland located in the lower front of your neck. The thyroid gland helps control metabolism. Metabolism is how your body handles food. It controls metabolism with the hormone thyroxine. When this gland is underactive (hypothyroid), it produces too little hormone.  CAUSES These include:   Absence or destruction of thyroid tissue.  Goiter due to iodine deficiency.  Goiter due to medications.  Congenital defects (since birth).  Problems with the pituitary. This causes a lack of TSH (thyroid stimulating hormone). This hormone tells the thyroid to turn out more hormone. SYMPTOMS  Lethargy (feeling as though you have no energy)  Cold intolerance  Weight gain (in spite of normal food intake)  Dry skin  Coarse hair  Menstrual irregularity (if severe, may lead to infertility)  Slowing of thought processes Cardiac problems are also caused by insufficient amounts of thyroid hormone. Hypothyroidism in the newborn is cretinism, and is an extreme form. It is important that this form be treated adequately and immediately or it will lead rapidly to retarded physical and mental development. DIAGNOSIS  To prove hypothyroidism, your caregiver may do blood tests and ultrasound tests. Sometimes the signs are hidden. It may be necessary for your caregiver to watch this illness with blood tests either before or after diagnosis and treatment. TREATMENT  Low levels of thyroid hormone are increased by using synthetic thyroid hormone. This is a safe, effective treatment. It usually takes about four weeks to gain the full effects of the medication. After you have the full effect of the medication, it will generally take another four weeks for problems to leave. Your caregiver may start you on low doses. If you have had heart problems the dose may be gradually increased. It is generally not an emergency to get rapidly to normal. HOME CARE INSTRUCTIONS   Take your  medications as your caregiver suggests. Let your caregiver know of any medications you are taking or start taking. Your caregiver will help you with dosage schedules.  As your condition improves, your dosage needs may increase. It will be necessary to have continuing blood tests as suggested by your caregiver.  Report all suspected medication side effects to your caregiver. SEEK MEDICAL CARE IF: Seek medical care if you develop:  Sweating.  Tremulousness (tremors).  Anxiety.  Rapid weight loss.  Heat intolerance.  Emotional swings.  Diarrhea.  Weakness. SEEK IMMEDIATE MEDICAL CARE IF:  You develop chest pain, an irregular heart beat (palpitations), or a rapid heart beat. MAKE SURE YOU:   Understand these instructions.  Will watch your condition.  Will get help right away if you are not doing well or get worse. Document Released: 02/22/2005 Document Revised: 05/17/2011 Document Reviewed: 10/13/2007 ExitCare Patient Information 2015 [employer]. This information is not intended to replace advice given to you by your health care provider. Make sure you discuss any questions you have with your health care provider.  

## 2013-10-24 LAB — URINALYSIS W MICROSCOPIC + REFLEX CULTURE
Bacteria, UA: NONE SEEN
Bilirubin Urine: NEGATIVE
CASTS: NONE SEEN
Crystals: NONE SEEN
Glucose, UA: NEGATIVE mg/dL
HGB URINE DIPSTICK: NEGATIVE
Ketones, ur: NEGATIVE mg/dL
Leukocytes, UA: NEGATIVE
Nitrite: NEGATIVE
PROTEIN: NEGATIVE mg/dL
Specific Gravity, Urine: 1.013 (ref 1.005–1.030)
Squamous Epithelial / LPF: NONE SEEN
Urobilinogen, UA: 0.2 mg/dL (ref 0.0–1.0)
pH: 6.5 (ref 5.0–8.0)

## 2013-10-24 LAB — T4: T4 TOTAL: 0.7 ug/dL — AB (ref 5.0–12.5)

## 2013-10-24 LAB — T3 UPTAKE: T3 UPTAKE: 32.9 % (ref 22.5–37.0)

## 2013-10-24 LAB — THYROID ANTIBODIES
THYROID PEROXIDASE ANTIBODY: 265 [IU]/mL — AB (ref <35.0)
Thyroglobulin Ab: 38.2 IU/mL (ref <40.0)

## 2013-10-24 LAB — TSH: TSH: 130.618 u[IU]/mL — ABNORMAL HIGH (ref 0.350–4.500)

## 2013-10-25 ENCOUNTER — Other Ambulatory Visit: Payer: Self-pay | Admitting: Women's Health

## 2013-10-25 DIAGNOSIS — E039 Hypothyroidism, unspecified: Secondary | ICD-10-CM

## 2013-10-25 MED ORDER — LEVOTHYROXINE SODIUM 300 MCG PO TABS
300.0000 ug | ORAL_TABLET | Freq: Every day | ORAL | Status: DC
Start: 2013-10-25 — End: 2013-12-06

## 2013-10-31 ENCOUNTER — Other Ambulatory Visit: Payer: Self-pay | Admitting: Gynecology

## 2013-10-31 ENCOUNTER — Telehealth: Payer: Self-pay | Admitting: *Deleted

## 2013-10-31 ENCOUNTER — Telehealth: Payer: Self-pay | Admitting: Gynecology

## 2013-10-31 DIAGNOSIS — E039 Hypothyroidism, unspecified: Secondary | ICD-10-CM

## 2013-10-31 DIAGNOSIS — Z3049 Encounter for surveillance of other contraceptives: Secondary | ICD-10-CM

## 2013-10-31 MED ORDER — LEVONORGESTREL 20 MCG/24HR IU IUD: INTRAUTERINE_SYSTEM | Freq: Once | INTRAUTERINE | Status: DC

## 2013-10-31 NOTE — Telephone Encounter (Signed)
Samantha Golden pt would probably get in sooner with Dr.Gherige with [facility], would like me to send pt there?

## 2013-10-31 NOTE — Telephone Encounter (Signed)
Message copied by Thamas Jaegers on Wed Oct 31, 2013  9:58 AM ------      Message from: [place], [place] J      Created: Thu Oct 25, 2013  3:32 PM       Telephone call to review thyroid panel, reviewed T3 normal, TSH elevated at 130.618, positive thyroid antibodies, CBC, lipid panel normal. Lipid panel much improved from last year. Reviewed to start back on Synthroid, has stopped for the past month will start back on Synthroid 300, will schedule appointment with Dr. Chalmers Cater to evaluate best treatment plan. Has had problems with not feeling well, hair, skin, weight issues.            Samantha Golden Please schedule referral to Dr. Chalmers Cater to evaluate hypothyroidism and medications, she is a Pharmacist, hospital, early a.m. or late day best for appointment time ------

## 2013-10-31 NOTE — Telephone Encounter (Signed)
10/31/13-LM VM for pt that her [facility] insurance covers the removal of old IUD and insertion of new Mirena at 100%.[place]

## 2013-10-31 NOTE — Telephone Encounter (Signed)
Referral placed for the below they will contact pt to schedule. 

## 2013-10-31 NOTE — Telephone Encounter (Signed)
Yes, that would be fine.

## 2013-11-08 NOTE — Telephone Encounter (Signed)
appointment 10/1/115 @ 9:15 am

## 2013-11-11 ENCOUNTER — Ambulatory Visit (INDEPENDENT_AMBULATORY_CARE_PROVIDER_SITE_OTHER): Payer: BC Managed Care – PPO | Admitting: Family Medicine

## 2013-11-11 ENCOUNTER — Ambulatory Visit (INDEPENDENT_AMBULATORY_CARE_PROVIDER_SITE_OTHER): Payer: BC Managed Care – PPO

## 2013-11-11 VITALS — BP 138/88 | HR 90 | Temp 99.0°F | Resp 18 | Ht 66.0 in | Wt 156.0 lb

## 2013-11-11 DIAGNOSIS — R062 Wheezing: Secondary | ICD-10-CM

## 2013-11-11 DIAGNOSIS — R059 Cough, unspecified: Secondary | ICD-10-CM

## 2013-11-11 DIAGNOSIS — R05 Cough: Secondary | ICD-10-CM

## 2013-11-11 DIAGNOSIS — J22 Unspecified acute lower respiratory infection: Secondary | ICD-10-CM

## 2013-11-11 DIAGNOSIS — J988 Other specified respiratory disorders: Secondary | ICD-10-CM

## 2013-11-11 MED ORDER — ALBUTEROL SULFATE (2.5 MG/3ML) 0.083% IN NEBU
2.5000 mg | INHALATION_SOLUTION | Freq: Once | RESPIRATORY_TRACT | Status: AC
  Administered 2013-11-11: 2.5 mg via RESPIRATORY_TRACT

## 2013-11-11 MED ORDER — AZITHROMYCIN 250 MG PO TABS: ORAL_TABLET | ORAL | Status: DC

## 2013-11-11 MED ORDER — ALBUTEROL SULFATE HFA 108 (90 BASE) MCG/ACT IN AERS: 1.0000 | INHALATION_SPRAY | Freq: Four times a day (QID) | RESPIRATORY_TRACT | Status: DC | PRN

## 2013-11-11 NOTE — Patient Instructions (Signed)
Saline nasal spray atleast 4 times per day if needed for nasal congestion, over the counter mucinex or mucinex DM for cough - stop if this causes more wheezing, drink plenty of fluids.  Start Zpak, and albuterol if needed for wheezing.if albuterol needed frequently more than the next 2-3 days - return for recheck.  Return to the clinic or go to the nearest emergency room if any of your symptoms worsen or new symptoms occur.Acute Bronchitis Bronchitis is inflammation of the airways that extend from the windpipe into the lungs (bronchi). The inflammation often causes mucus to develop. This leads to a cough, which is the most common symptom of bronchitis.  In acute bronchitis, the condition usually develops suddenly and goes away over time, usually in a couple weeks. Smoking, allergies, and asthma can make bronchitis worse. Repeated episodes of bronchitis may cause further lung problems.  CAUSES Acute bronchitis is most often caused by the same virus that causes a cold. The virus can spread from person to person (contagious) through coughing, sneezing, and touching contaminated objects. SIGNS AND SYMPTOMS   Cough.   Fever.   Coughing up mucus.   Body aches.   Chest congestion.   Chills.   Shortness of breath.   Sore throat.  DIAGNOSIS  Acute bronchitis is usually diagnosed through a physical exam. Your health care provider will also ask you questions about your medical history. Tests, such as chest X-rays, are sometimes done to rule out other conditions.  TREATMENT  Acute bronchitis usually goes away in a couple weeks. Oftentimes, no medical treatment is necessary. Medicines are sometimes given for relief of fever or cough. Antibiotic medicines are usually not needed but may be prescribed in certain situations. In some cases, an inhaler may be recommended to help reduce shortness of breath and control the cough. A cool mist vaporizer may also be used to help thin bronchial secretions  and make it easier to clear the chest.  HOME CARE INSTRUCTIONS  Get plenty of rest.   Drink enough fluids to keep your urine clear or pale yellow (unless you have a medical condition that requires fluid restriction). Increasing fluids may help thin your respiratory secretions (sputum) and reduce chest congestion, and it will prevent dehydration.   Take medicines only as directed by your health care provider.  If you were prescribed an antibiotic medicine, finish it all even if you start to feel better.  Avoid smoking and secondhand smoke. Exposure to cigarette smoke or irritating chemicals will make bronchitis worse. If you are a smoker, consider using nicotine gum or skin patches to help control withdrawal symptoms. Quitting smoking will help your lungs heal faster.   Reduce the chances of another bout of acute bronchitis by washing your hands frequently, avoiding people with cold symptoms, and trying not to touch your hands to your mouth, nose, or eyes.   Keep all follow-up visits as directed by your health care provider.  SEEK MEDICAL CARE IF: Your symptoms do not improve after 1 week of treatment.  SEEK IMMEDIATE MEDICAL CARE IF:  You develop an increased fever or chills.   You have chest pain.   You have severe shortness of breath.  You have bloody sputum.   You develop dehydration.  You faint or repeatedly feel like you are going to pass out.  You develop repeated vomiting.  You develop a severe headache. MAKE SURE YOU:   Understand these instructions.  Will watch your condition.  Will get help right away if  you are not doing well or get worse. Document Released: 04/01/2004 Document Revised: 07/09/2013 Document Reviewed: 08/15/2012 [facility] Patient Information 2015 [place], [place]. This information is not intended to replace advice given to you by your health care provider. Make sure you discuss any questions you have with your health care provider.

## 2013-11-11 NOTE — Progress Notes (Addendum)
Subjective:    Patient ID: Samantha Golden, female    DOB: 12-Jul-1965, 48 y.o.   MRN: [id number] This chart was scribed for Samantha Agreste, MD by Martinique Peace, ED Scribe. The patient was seen in [facility]. The patient's care was started at 9:46 AM.   HPI HPI Comments: [name] is a 48 y.o. female who presents to the [facility] complaining of cough onset 4 days ago with associated wheezing and low-grade fever. She also complains of experiencing hot and cold spells yesterday. She denies history of asthma but reports history of bronchitis and pneumonia in the past. 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 Pt states symptoms worsen at night and cause her to experience some trouble sleeping. She states that she has used some over the counter medication to treat symptoms without much relief. She denies history of  heart conditions.   Past Medical History  Diagnosis Date  . Hypothyroidism   . BRCA negative 08/09  . Cervical dysplasia    Past Surgical History  Procedure Laterality Date  . Adenoidectomy    . Colposcopy    . Cervix lesion destruction    . Intrauterine device (iud) insertion  08/31/2012    ParaGard   Allergies  Allergen Reactions  . Augmentin [Amoxicillin-Pot Clavulanate]   . Other     "CLEANING MATERIALS"   Prior to Admission medications   Medication Sig Start Date End Date Taking? Authorizing Provider  amphetamine-dextroamphetamine (ADDERALL XR) 20 MG 24 hr capsule Take 1-2 PO QAM, 1 PO QPM 06/28/13  Yes Barton Fanny, MD  Calcium Carbonate-Vitamin D (CALCIUM + D PO) Take by mouth.     Yes Historical Provider, MD  ibuprofen (ADVIL,MOTRIN) 600 MG tablet Take 1 tablet (600 mg total) by mouth every 8 (eight) hours as  needed. 10/23/13  Yes Huel Cote, NP  levothyroxine (SYNTHROID) 300 MCG tablet Take 1 tablet (300 mcg total) by mouth daily before breakfast. 10/25/13  Yes Huel Cote, NP  Multiple Vitamins-Iron (MULTIVITAMIN/IRON PO) Take by mouth.     Yes Historical Provider, MD   History   Social History  . Marital Status: Divorced    Spouse Name: N/A    Number of Children: N/A  . Years of Education: N/A   Occupational History  . Not on file.   Social History Main Topics  . Smoking status: Former Research scientist (life sciences)  . Smokeless tobacco: Never Used  . Alcohol Use: 1.0 oz/week    2 drink(s) per week  . Drug Use: No  . Sexual Activity: Yes    Birth Control/ Protection: IUD     Comment: ParaGard 08/2012   Other Topics Concern  . Not on file   Social History Narrative  . No narrative on file    Review of Systems  Constitutional: Positive for fever (low-grade) and chills.  Respiratory: Positive for cough and wheezing.        Objective:   Physical Exam  Vitals reviewed. Constitutional: She is oriented to person, place, and time. She appears well-developed and well-nourished. No distress.  HENT:  Head: Normocephalic and atraumatic.  Right Ear: Hearing, tympanic membrane, external ear and ear canal normal.  Left  Ear: Hearing, tympanic membrane, external ear and ear canal normal.  Nose: Nose normal.  Mouth/Throat: Oropharynx is clear and moist. No oropharyngeal exudate.  Eyes: Conjunctivae and EOM are normal. Pupils are equal, round, and reactive to light.  Neck:  No LAD.   Cardiovascular: Normal rate, regular rhythm, normal heart sounds and intact distal pulses.   No murmur heard. Pulmonary/Chest: Effort normal. No respiratory distress. She has wheezes. She has no rhonchi. She has no rales. She exhibits no tenderness.  Faint rhonchi in right greater than left lower lobe. Faint expiratory wheeze of upper airways.   Lymphadenopathy:    She has no cervical adenopathy.  Neurological: She is  alert and oriented to person, place, and time.  Skin: Skin is warm and dry. No rash noted. She is not diaphoretic.  Psychiatric: She has a normal mood and affect. Her behavior is normal.   Filed Vitals:   11/11/13 0916  BP: 138/88  Pulse: 90  Temp: 99 F (37.2 C)  TempSrc: Oral  Resp: 18  Height: 5' 6" (1.676 m)  Weight: 156 lb (70.761 kg)  SpO2: 95%   [facility] reading (PRIMARY) by  Dr. Greene: CXR: few diffuse increased bronchitic markings without discrete infiltrate.   After 2.5mg neb treatment: slight improved aeration, few coarse BS lower lobes, normal effort.      Assessment & Plan:   Samantha Golden is a 48 y.o. female Cough - Plan: DG Chest 2 View, azithromycin (ZITHROMAX) 250 MG tablet, albuterol (PROVENTIL HFA;VENTOLIN HFA) 108 (90 BASE) MCG/ACT inhaler  Wheezing - Plan: albuterol (PROVENTIL) (2.5 MG/3ML) 0.083% nebulizer solution 2.5 mg, azithromycin (ZITHROMAX) 250 MG tablet, albuterol (PROVENTIL HFA;VENTOLIN HFA) 108 (90 BASE) MCG/ACT inhaler  LRTI (lower respiratory tract infection) - Plan: azithromycin (ZITHROMAX) 250 MG tablet, albuterol (PROVENTIL HFA;VENTOLIN HFA) 108 (90 BASE) MCG/ACT inhaler  Hx of pneumonia, not seen on XR today.  LRTI/bronchitis with secondary wheeze/bronchospasm. Min change with neb, but slight improved. No resp distress.   -start Zpak, mucinex if needed (stop if incr wheeze), and albuterol if needed.   -sx care, rtc precautions discussed.   Discussed appt for CPE with primary provider - can call to have this done.   Meds ordered this encounter  Medications  . albuterol (PROVENTIL) (2.5 MG/3ML) 0.083% nebulizer solution 2.5 mg    Sig:   . azithromycin (ZITHROMAX) 250 MG tablet    Sig: Take 2 pills by mouth on day 1, then 1 pill by mouth per day on days 2 through 5.    Dispense:  6 tablet    Refill:  0  . albuterol (PROVENTIL HFA;VENTOLIN HFA) 108 (90 BASE) MCG/ACT inhaler    Sig: Inhale 1-2 puffs into the lungs every 6 (six) hours as  needed for wheezing or shortness of breath.    Dispense:  1 Inhaler    Refill:  0   Patient Instructions  Saline nasal spray atleast 4 times per day if needed for nasal congestion, over the counter mucinex or mucinex DM for cough - stop if this causes more wheezing, drink plenty of fluids.  Start Zpak, and albuterol if needed for wheezing.if albuterol needed frequently more than the next 2-3 days - return for recheck.  Return to the clinic or go to the nearest emergency room if any of your symptoms worsen or new symptoms occur.Acute Bronchitis Bronchitis is inflammation of the airways that extend from the windpipe into the lungs (bronchi). The inflammation often causes mucus to develop. This leads to a   cough, which is the most common symptom of bronchitis.  In acute bronchitis, the condition usually develops suddenly and goes away over time, usually in a couple weeks. Smoking, allergies, and asthma can make bronchitis worse. Repeated episodes of bronchitis may cause further lung problems.  CAUSES Acute bronchitis is most often caused by the same virus that causes a cold. The virus can spread from person to person (contagious) through coughing, sneezing, and touching contaminated objects. SIGNS AND SYMPTOMS   Cough.   Fever.   Coughing up mucus.   Body aches.   Chest congestion.   Chills.   Shortness of breath.   Sore throat.  DIAGNOSIS  Acute bronchitis is usually diagnosed through a physical exam. Your health care provider will also ask you questions about your medical history. Tests, such as chest X-rays, are sometimes done to rule out other conditions.  TREATMENT  Acute bronchitis usually goes away in a couple weeks. Oftentimes, no medical treatment is necessary. Medicines are sometimes given for relief of fever or cough. Antibiotic medicines are usually not needed but may be prescribed in certain situations. In some cases, an inhaler may be recommended to help reduce  shortness of breath and control the cough. A cool mist vaporizer may also be used to help thin bronchial secretions and make it easier to clear the chest.  HOME CARE INSTRUCTIONS  Get plenty of rest.   Drink enough fluids to keep your urine clear or pale yellow (unless you have a medical condition that requires fluid restriction). Increasing fluids may help thin your respiratory secretions (sputum) and reduce chest congestion, and it will prevent dehydration.   Take medicines only as directed by your health care provider.  If you were prescribed an antibiotic medicine, finish it all even if you start to feel better.  Avoid smoking and secondhand smoke. Exposure to cigarette smoke or irritating chemicals will make bronchitis worse. If you are a smoker, consider using nicotine gum or skin patches to help control withdrawal symptoms. Quitting smoking will help your lungs heal faster.   Reduce the chances of another bout of acute bronchitis by washing your hands frequently, avoiding people with cold symptoms, and trying not to touch your hands to your mouth, nose, or eyes.   Keep all follow-up visits as directed by your health care provider.  SEEK MEDICAL CARE IF: Your symptoms do not improve after 1 week of treatment.  SEEK IMMEDIATE MEDICAL CARE IF:  You develop an increased fever or chills.   You have chest pain.   You have severe shortness of breath.  You have bloody sputum.   You develop dehydration.  You faint or repeatedly feel like you are going to pass out.  You develop repeated vomiting.  You develop a severe headache. MAKE SURE YOU:   Understand these instructions.  Will watch your condition.  Will get help right away if you are not doing well or get worse. Document Released: 04/01/2004 Document Revised: 07/09/2013 Document Reviewed: 08/15/2012 [employer] Patient Information 2015 [employer]. This information is not intended to replace advice given to you  by your health care provider. Make sure you discuss any questions you have with your health care provider.         I personally performed the services described in this documentation, which was scribed in my presence. The recorded information has been reviewed and considered, and addended by me as needed.    

## 2013-11-14 ENCOUNTER — Telehealth: Payer: Self-pay

## 2013-11-14 NOTE — Telephone Encounter (Signed)
Spoke with pt. She is not feeling any better at all. Still having SOB, cough. Wants to know if she needs a stronger. Has taken ZPak in the past successfully, but isn't noticing any improvement this time

## 2013-11-14 NOTE — Telephone Encounter (Signed)
Patient called once more regarding her condition. States she has not yet heard anything. Advised pt that I would put in another message for her and someone would call her back as soon as they can.

## 2013-11-14 NOTE — Telephone Encounter (Signed)
Patient states she was here on 9/6 for a cough  and saw Dr. Carlota Raspberry. She was prescribed azithromycin (ZITHROMAX) 250 MG tablet and she feels she is getting worse. She would like to be prescribed something stronger. Please advise at [phone].

## 2013-11-15 NOTE — Telephone Encounter (Signed)
Spoke with pt. She is unable to RTC until Sat. Will try Delsym for the cough until then.

## 2013-11-15 NOTE — Telephone Encounter (Signed)
Left message on machine to call back  

## 2013-11-15 NOTE — Telephone Encounter (Signed)
If feeling worse - especially if wheezing is still persisting - should rtc or ER to be seen. Zpak is in system for 10 days, but if feels worse, should be seen to make sure treatment does not need to be changed.

## 2013-12-06 ENCOUNTER — Ambulatory Visit (INDEPENDENT_AMBULATORY_CARE_PROVIDER_SITE_OTHER): Payer: BC Managed Care – PPO | Admitting: Internal Medicine

## 2013-12-06 ENCOUNTER — Encounter: Payer: Self-pay | Admitting: Internal Medicine

## 2013-12-06 VITALS — BP 130/84 | HR 68 | Temp 98.7°F | Resp 12 | Ht 66.0 in | Wt 156.8 lb

## 2013-12-06 DIAGNOSIS — E038 Other specified hypothyroidism: Secondary | ICD-10-CM

## 2013-12-06 LAB — TSH: TSH: 0.08 u[IU]/mL — AB (ref 0.35–4.50)

## 2013-12-06 LAB — T4, FREE: FREE T4: 1.75 ng/dL — AB (ref 0.60–1.60)

## 2013-12-06 MED ORDER — SYNTHROID 200 MCG PO TABS: 200.0000 ug | ORAL_TABLET | Freq: Every day | ORAL | Status: DC

## 2013-12-06 MED ORDER — SYNTHROID 50 MCG PO TABS: 50.0000 ug | ORAL_TABLET | Freq: Every day | ORAL | Status: DC

## 2013-12-06 NOTE — Addendum Note (Signed)
Addended by: Philemon Kingdom on: 12/06/2013 07:06 PM   Modules accepted: Orders, Medications

## 2013-12-06 NOTE — Patient Instructions (Signed)
Please continue the Synthroid 300 mcg daily in am >> move this first thing in am and drink your coffee >30 min afterwards. Please separate this by >4 hours from acid reflux medications, calcium, iron, multivitamins, if you decide to start them.  Please stop at the lab and also come back in 6-8 weeks for labs.  Please come back for a follow-up appointment in 4 months

## 2013-12-06 NOTE — Progress Notes (Addendum)
Patient ID: Samantha Golden, female   DOB: 09-Aug-1965, 48 y.o.   MRN: [id number]   HPI  Samantha Golden is a 48 y.o.-year-old female, referred by Dr Phineas Real, for management of uncontrolled Hashimoto's hypothyroidism.  Pt. has been dx with hypothyroidism in ~1990; is on Synthroid DAW 225 >> 300 mcg (new dose started in 10/2013), taken: - fasting, but coffee + sugar free vanilla creamer before it - no b'fast during the weekdays; in the weekends >1h before b'fast - no calcium, multivitamins - if she takes them, it is at dinner - no iron, PPI  I reviewed pt's thyroid tests: Lab Results  Component Value Date   TSH 130.618* 10/23/2013   TSH 90.174* 03/10/2012  TSH 4.26 in 2012.  She has been out of the LT4 for ~ 1 mo before the test in 10/2013.  Component     Latest Ref Rng 10/23/2013  Thyroid Peroxidase Antibody     <35.0 IU/mL 265.0 (H)  Thyroglobulin Ab     <40.0 IU/mL 38.2   Pt describes: - + fatigue - + weight gain - no cold intolerance - + heat intolerance (hot flushes) - + diarrhea/+ constipation - no dry skin - + hair falling - no depression  Pt denies feeling nodules in neck, hoarseness, dysphagia/odynophagia, SOB with lying down.  She has + FH of thyroid disorders in: mother and both brothers. No FH of thyroid cancer.  No h/o radiation tx to head or neck. No recent use of iodine supplements.  ROS: Constitutional: see HPI Eyes: + blurry vision, no xerophthalmia ENT: no sore throat, no nodules palpated in throat, no dysphagia/odynophagia, no hoarseness Cardiovascular: no CP/+ SOB/no palpitations/leg swelling Respiratory: no cough/+ SOB Gastrointestinal: no N/V/+ D/+ C Musculoskeletal: no muscle/joint aches Skin: no rashes, + easy bruising, + hair loss Neurological: no tremors/numbness/tingling/dizziness Psychiatric: no depression/anxiety  Past Medical History  Diagnosis Date  . Hypothyroidism   . BRCA negative 08/09  . Cervical dysplasia    Past Surgical  History  Procedure Laterality Date  . Adenoidectomy    . Colposcopy    . Cervix lesion destruction    . Intrauterine device (iud) insertion  08/31/2012    ParaGard   History   Social History  . Marital Status: Divorced    Spouse Name: N/A    Number of Children: 3   Occupational History  . teacher   Social History Main Topics  . Smoking status: Former Research scientist (life sciences)  . Smokeless tobacco: Never Used  . Alcohol Use: 1.0 oz/week    2 drink(s) per week  . Drug Use: No  . Sexual Activity: Yes    Birth Control/ Protection: IUD     Comment: ParaGard 08/2012   Current Outpatient Prescriptions on File Prior to Visit  Medication Sig Dispense Refill  . levothyroxine (SYNTHROID) 300 MCG tablet Take 1 tablet (300 mcg total) by mouth daily before breakfast.  30 tablet  3  . Multiple Vitamins-Iron (MULTIVITAMIN/IRON PO) Take by mouth.        Marland Kitchen albuterol (PROVENTIL HFA;VENTOLIN HFA) 108 (90 BASE) MCG/ACT inhaler Inhale 1-2 puffs into the lungs every 6 (six) hours as needed for wheezing or shortness of breath.  1 Inhaler  0  . amphetamine-dextroamphetamine (ADDERALL XR) 20 MG 24 hr capsule Take 1-2 PO QAM, 1 PO QPM  90 capsule  0  . azithromycin (ZITHROMAX) 250 MG tablet Take 2 pills by mouth on day 1, then 1 pill by mouth per day on days 2 through 5.  6 tablet  0  . Calcium Carbonate-Vitamin D (CALCIUM + D PO) Take by mouth.        Marland Kitchen ibuprofen (ADVIL,MOTRIN) 600 MG tablet Take 1 tablet (600 mg total) by mouth every 8 (eight) hours as needed.  60 tablet  1   Current Facility-Administered Medications on File Prior to Visit  Medication Dose Route Frequency Provider Last Rate Last Dose  . levonorgestrel (MIRENA) 20 MCG/24HR IUD   Intrauterine Once Anastasio Auerbach, MD       Allergies  Allergen Reactions  . Augmentin [Amoxicillin-Pot Clavulanate]   . Other     "CLEANING MATERIALS"   Family History  Problem Relation Age of Onset  . Breast cancer Mother     Age 8  . Cancer Paternal Aunt      Cervical  . Breast cancer Maternal Grandmother     Age 76's  . Diabetes Maternal Grandfather   . Diabetes Paternal Uncle   . Breast cancer Paternal Aunt     Age 32's   PE: BP 130/84  Pulse 68  Temp(Src) 98.7 F (37.1 C) (Oral)  Resp 12  Ht '5\' 6"'  (1.676 m)  Wt 156 lb 12.8 oz (71.124 kg)  BMI 25.32 kg/m2  SpO2 97%  LMP 11/09/2013 Wt Readings from Last 3 Encounters:  12/06/13 156 lb 12.8 oz (71.124 kg)  11/11/13 156 lb (70.761 kg)  10/23/13 160 lb 3.2 oz (72.666 kg)   Constitutional: normal weight, in NAD Eyes: PERRLA, EOMI, no exophthalmos ENT: moist mucous membranes, no thyromegaly, no cervical lymphadenopathy Cardiovascular: RRR, No MRG Respiratory: CTA B Gastrointestinal: abdomen soft, NT, ND, BS+ Musculoskeletal: no deformities, strength intact in all 4 Skin: moist, warm, no rashes Neurological: no tremor with outstretched hands, DTR normal in all 4  ASSESSMENT: 1. Hypothyroidism  PLAN:  1. Patient with long-standing uncontrolled hypothyroidism, on Synthroid DAW therapy. She appears euthyroid, despite very high TSH levels. She does not appear to have a goiter, thyroid nodules, or neck compression symptoms - We discussed about correct intake of levothyroxine, fasting, with water, separated by at least 30 minutes from breakfast, and separated by more than 4 hours from calcium, iron, multivitamins, acid reflux medications (PPIs). I advised her to move it first thing in am. - will check thyroid tests today: TSH, free T4 and also repeat in 2 mo - we discussed about Armour thyroid. She is interested in this as she did not feel very good even when tests have been normal in the past. I explained that this can be as she has high Abs against the thyroid, but we can also try Armour - I would first like for the TFTs to normalize before switching We discussed about positive and negative aspects of using Armour thyroid. I underlined the fact that:  Armour is purified from porcine  thyroid glands, which is not without risk for contaminants  Also, the ratio between T4 and T3 in Armour is physiologic for pigs, not for humans.  The short half life of T3 can cause fluctuations in blood levels, which can result in mood swings and heart rhythm abnormalities.  The concentration of the active substances (T4 and T3) can be expected to vary between different Armour lots, which can cause variation in the thyroid function tests.  - she agrees to plan - I will see her back in 4 months  Office Visit on 12/06/2013  Component Date Value Ref Range Status  . TSH 12/06/2013 0.08* 0.35 - 4.50 uIU/mL Final  .  Free T4 12/06/2013 1.75* 0.60 - 1.60 ng/dL Final   TSH now suppressed >> will advise to decrease the dose of LT4 to 250 mcg and repeat the TFTs in 6 weeks.

## 2014-01-03 ENCOUNTER — Other Ambulatory Visit: Payer: BC Managed Care – PPO

## 2014-01-07 ENCOUNTER — Encounter: Payer: Self-pay | Admitting: Internal Medicine

## 2014-01-07 ENCOUNTER — Ambulatory Visit (INDEPENDENT_AMBULATORY_CARE_PROVIDER_SITE_OTHER): Payer: BC Managed Care – PPO | Admitting: Physician Assistant

## 2014-01-07 VITALS — BP 122/80 | HR 84 | Temp 98.8°F | Resp 18 | Ht 67.0 in | Wt 153.4 lb

## 2014-01-07 DIAGNOSIS — J029 Acute pharyngitis, unspecified: Secondary | ICD-10-CM

## 2014-01-07 DIAGNOSIS — R509 Fever, unspecified: Secondary | ICD-10-CM

## 2014-01-07 LAB — POCT INFLUENZA A/B
INFLUENZA B, POC: NEGATIVE
Influenza A, POC: NEGATIVE

## 2014-01-07 LAB — POCT RAPID STREP A (OFFICE): RAPID STREP A SCREEN: NEGATIVE

## 2014-01-07 MED ORDER — AZITHROMYCIN 250 MG PO TABS: ORAL_TABLET | ORAL | Status: DC

## 2014-01-07 MED ORDER — IBUPROFEN 800 MG PO TABS: ORAL_TABLET | ORAL | Status: DC

## 2014-01-07 NOTE — Patient Instructions (Signed)

## 2014-01-07 NOTE — Progress Notes (Signed)
IDENTIFYING INFORMATION  Samantha Golden / DOB: 1965/03/24 / MRN: [id number]  The patient has Hypothyroidism due to Hashimoto's thyroiditis and ADHD (attention deficit hyperactivity disorder), inattentive type on her problem list.  SUBJECTIVE  Chief Complaint: Sore Throat; Fever; and Chills   History of present illness: Samantha Golden is a 48 y.o. year old female who presents with 3 days of sore throat. She reports that Sunday she became very fatigued, and has had some mild cough with headache.  She measured a fever of 102 yesterday and reports taking ibuprofen which has helped.   She also reports anterior neck tenderness, along with mild neck pain and bilateral upper extremity soreness. She complains of alternating chills and fever that have been waking her up at night.  She has not received her seasonal flu shot.  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  She does not endorse recent travel.    She  has a past medical history of Hypothyroidism; BRCA negative (08/09); Cervical dysplasia; and Abnormal cervical Papanicolaou smear.  The patient has a current medication list which includes the following prescription(s): multiple vitamins-iron, synthroid, synthroid, albuterol, amphetamine-dextroamphetamine, calcium carbonate-vitamin d, and the following Facility-Administered Medications: levonorgestrel.  Samantha Golden is allergic to augmentin. She  reports that she has quit smoking. Her smoking use included Cigarettes. She has never used smokeless tobacco. She reports that she drinks about 1.0 oz of alcohol per week. She reports that she does not use illicit drugs. She  reports that she currently engages in sexual activity. She reports using the following method of birth control/protection: IUD.  The patient  has past surgical history that includes Adenoidectomy; Colposcopy; Cervix lesion destruction; and Intrauterine device (iud) insertion (08/31/2012).  Her family history includes  Breast cancer in her maternal grandmother, mother, and paternal aunt; Cancer in her paternal aunt; Diabetes in her maternal grandfather and paternal uncle.  Review of Systems  Constitutional: Positive for fever, chills and malaise/fatigue. Negative for weight loss and diaphoresis.  HENT: Positive for congestion (mild) and sore throat.   Eyes: Negative.   Respiratory: Negative.   Cardiovascular: Negative.   Gastrointestinal: Negative.   Genitourinary: Negative.   Musculoskeletal: Positive for myalgias and neck pain.  Skin: Negative.   Neurological: Positive for weakness and headaches. Negative for dizziness, tingling, tremors, sensory change, speech change, focal weakness, seizures and loss of consciousness.  Psychiatric/Behavioral: Negative.     OBJECTIVE  Blood pressure 122/80, pulse 84, temperature 98.8 F (37.1 C), temperature source Oral, resp. rate 18, height _0  (1.702 m), weight 153 lb 6.4 oz (69.582 kg), last menstrual period 12/31/2013, SpO2 98 %. The patient's body mass index is 24.02 kg/(m^2).  Physical Exam  Constitutional: She is oriented to person, place, and time and well-developed, well-nourished, and in no distress.  HENT:  Head: Normocephalic.  Right Ear: Hearing, tympanic membrane, external ear and ear canal normal.  Left Ear: Hearing, tympanic membrane, external ear and ear canal normal.  Nose: Nose normal. No mucosal edema, rhinorrhea, sinus tenderness or nasal deformity. No epistaxis. Right sinus exhibits no maxillary sinus tenderness and no frontal sinus tenderness. Left sinus exhibits no maxillary sinus tenderness and no frontal sinus tenderness.  Mouth/Throat: Uvula is midline. Mucous membranes are not pale, dry and not cyanotic. No uvula swelling. Posterior oropharyngeal erythema present. No oropharyngeal exudate, posterior oropharyngeal edema or tonsillar abscesses.  Eyes: Conjunctivae and EOM are normal. Pupils are equal, round, and reactive to light.  Neck: Normal range of motion. Neck supple.  Cardiovascular: Normal rate.   Pulmonary/Chest: Effort normal.  Lymphadenopathy:    She has cervical adenopathy.  Neurological: She is alert and oriented to person, place, and time.  Skin: Skin is warm and dry. She is not diaphoretic.  Psychiatric: Mood, memory, affect and judgment normal.    Results for orders placed or performed in visit on 01/07/14 (from the past 24 hour(s))  POCT rapid strep A     Status: None   Collection Time: 01/07/14  8:49 PM  Result Value Ref Range   Rapid Strep A Screen Negative Negative  POCT Influenza A/B     Status: None   Collection Time: 01/07/14  8:49 PM  Result Value Ref Range   Influenza A, POC Negative    Influenza B, POC Negative     ASSESSMENT & PLAN  Eliah was seen today for sore throat, fever and chills.  Diagnoses and associated orders for this visit:  Sore throat - POCT rapid strep A - Culture, Group A Strep - ibuprofen (ADVIL,MOTRIN) 800 MG tablet; Take 1 tab every eight hours for pain - azithromycin (ZITHROMAX) 250 MG tablet; Take 2 tabs PO x 1 dose, then 1 tab PO QD x 4 days.  Patient was asked to hold this medication until the results of the culture come back, and she was amenable to this plan.   Fever, unspecified fever cause - POCT rapid strep A - Culture, Group A Strep - POCT Influenza A/B - ibuprofen (ADVIL,MOTRIN) 800 MG tablet; Take 1 tab every eight hours for pain -     The patient was instructed to drink more fluids throughout the day.     The patient was instructed to to call or comeback to clinic as needed, or should symptoms warrant.  Philis Fendt, MHS, PA-C [facility] 01/07/2014 9:09 PM      \

## 2014-01-09 NOTE — Progress Notes (Signed)
I have discussed this case with Mr. Carlis Abbott, [place] and agree.

## 2014-01-11 LAB — CULTURE, GROUP A STREP

## 2014-01-25 ENCOUNTER — Ambulatory Visit (INDEPENDENT_AMBULATORY_CARE_PROVIDER_SITE_OTHER): Payer: BC Managed Care – PPO | Admitting: Emergency Medicine

## 2014-01-25 ENCOUNTER — Ambulatory Visit (INDEPENDENT_AMBULATORY_CARE_PROVIDER_SITE_OTHER): Payer: BC Managed Care – PPO

## 2014-01-25 VITALS — BP 122/86 | HR 76 | Temp 98.2°F | Resp 16 | Ht 66.0 in | Wt 151.0 lb

## 2014-01-25 DIAGNOSIS — J22 Unspecified acute lower respiratory infection: Secondary | ICD-10-CM

## 2014-01-25 DIAGNOSIS — J988 Other specified respiratory disorders: Secondary | ICD-10-CM

## 2014-01-25 DIAGNOSIS — R059 Cough, unspecified: Secondary | ICD-10-CM

## 2014-01-25 DIAGNOSIS — R062 Wheezing: Secondary | ICD-10-CM

## 2014-01-25 DIAGNOSIS — R05 Cough: Secondary | ICD-10-CM

## 2014-01-25 LAB — POCT CBC
Granulocyte percent: 61 %G (ref 37–80)
HCT, POC: 50.6 % — AB (ref 37.7–47.9)
HEMOGLOBIN: 16.6 g/dL — AB (ref 12.2–16.2)
Lymph, poc: 2.3 (ref 0.6–3.4)
MCH: 31.2 pg (ref 27–31.2)
MCHC: 32.8 g/dL (ref 31.8–35.4)
MCV: 95.1 fL (ref 80–97)
MID (CBC): 0.4 (ref 0–0.9)
MPV: 6.9 fL (ref 0–99.8)
POC Granulocyte: 4.2 (ref 2–6.9)
POC LYMPH PERCENT: 32.9 %L (ref 10–50)
POC MID %: 6.1 %M (ref 0–12)
Platelet Count, POC: 286 10*3/uL (ref 142–424)
RBC: 5.31 M/uL (ref 4.04–5.48)
RDW, POC: 13.3 %
WBC: 6.9 10*3/uL (ref 4.6–10.2)

## 2014-01-25 MED ORDER — LEVOFLOXACIN 500 MG PO TABS: 500.0000 mg | ORAL_TABLET | Freq: Every day | ORAL | Status: DC

## 2014-01-25 MED ORDER — IPRATROPIUM BROMIDE 0.02 % IN SOLN
0.5000 mg | Freq: Once | RESPIRATORY_TRACT | Status: AC
  Administered 2014-01-25: 0.5 mg via RESPIRATORY_TRACT

## 2014-01-25 MED ORDER — ALBUTEROL SULFATE HFA 108 (90 BASE) MCG/ACT IN AERS: 1.0000 | INHALATION_SPRAY | Freq: Four times a day (QID) | RESPIRATORY_TRACT | Status: DC | PRN

## 2014-01-25 MED ORDER — ALBUTEROL SULFATE (2.5 MG/3ML) 0.083% IN NEBU
2.5000 mg | INHALATION_SOLUTION | Freq: Once | RESPIRATORY_TRACT | Status: AC
  Administered 2014-01-25: 2.5 mg via RESPIRATORY_TRACT

## 2014-01-25 MED ORDER — IPRATROPIUM BROMIDE 0.03 % NA SOLN: 2.0000 | Freq: Two times a day (BID) | NASAL | Status: DC

## 2014-01-25 NOTE — Progress Notes (Signed)
Subjective:    Patient ID: Samantha Golden, female    DOB: 1965/05/03, 48 y.o.   MRN: [id number]  HPI Patient presents with 5 days of productive cough that is progressively getting worse. Wheezing present when laying down and walking longer distances. Has associated rhinorrhea, congestion, and ear pressure. 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 Was diagnosed with bronchitis in October 2015 and Strep pharyngitis Nov 2015. Uses albuterol inhaler 1-2x/day when wheezing increased. Uses DayQuil and NightQuil without relief. No h/o asthma, but has allergies. Former smoker that quit 10 years ago.   Review of Systems  Constitutional: Positive for fatigue. Negative for fever, chills, diaphoresis, activity change and appetite change.  HENT: Positive for congestion, ear pain (pressure), rhinorrhea, sinus pressure and sore throat. Negative for ear discharge, postnasal drip and sneezing.   Eyes: Negative for pain, discharge and itching.  Respiratory: Positive for cough (productive) and wheezing. Negative for chest tightness and shortness of breath.   Cardiovascular: Negative for chest pain, palpitations and leg swelling.  Gastrointestinal: Positive for vomiting (secondary to cough). Negative for nausea and abdominal pain.  Musculoskeletal: Negative for myalgias, neck pain and neck stiffness.  Skin: Negative for rash and wound.  Allergic/Immunologic: Positive for environmental allergies. Negative for food allergies.  Neurological: Negative for dizziness, light-headedness and headaches.  Hematological: Negative for adenopathy.       Objective:   Physical Exam  Constitutional: She is oriented to person, place, and time. She appears well-developed and well-nourished. No distress.  Blood pressure 122/86, pulse 76, temperature 98.2 F (36.8 C), resp. rate 16, height 5\' 6"  (1.676 m), weight 151 lb (68.493 kg), last menstrual period 12/31/2013, SpO2 96 %.   HENT:  Head: Normocephalic and  atraumatic.  Right Ear: Tympanic membrane, external ear and ear canal normal.  Left Ear: Tympanic membrane, external ear and ear canal normal.  Nose: Mucosal edema and rhinorrhea present. No nose lacerations or sinus tenderness. Right sinus exhibits maxillary sinus tenderness. Right sinus exhibits no frontal sinus tenderness. Left sinus exhibits maxillary sinus tenderness. Left sinus exhibits no frontal sinus tenderness.  Mouth/Throat: Uvula is midline, oropharynx is clear and moist and mucous membranes are normal. No oropharyngeal exudate, posterior oropharyngeal edema or posterior oropharyngeal erythema.  Eyes: Conjunctivae are normal. Pupils are equal, round, and reactive to light. Right eye exhibits no discharge. Left eye exhibits no discharge. No scleral icterus.  Neck: Normal range of motion. Neck supple. No thyromegaly present.  Cardiovascular: Normal rate, regular rhythm and normal heart sounds.  Exam reveals no gallop and no friction rub.   No murmur heard. Pulmonary/Chest: Effort normal. No respiratory distress. She has wheezes. She has no rales. She exhibits no tenderness.  Abdominal: Soft. Bowel sounds are normal. There is no tenderness.  Lymphadenopathy:    She has no cervical adenopathy.  Neurological: She is alert and oriented to person, place, and time.  Skin: Skin is warm and dry. No rash noted. She is not diaphoretic. No erythema. No pallor.    Results for orders placed or performed in visit on 01/25/14  POCT CBC  Result Value Ref Range   WBC 6.9 4.6 - 10.2 K/uL   Lymph, poc 2.3 0.6 - 3.4   POC LYMPH PERCENT 32.9 10 - 50 %L   MID (cbc) 0.4 0 - 0.9   POC MID % 6.1 0 - 12 %M   POC Granulocyte 4.2 2 - 6.9   Granulocyte percent 61.0 37 - 80 %G  RBC 5.31 4.04 - 5.48 M/uL   Hemoglobin 16.6 (A) 12.2 - 16.2 g/dL   HCT, POC 50.6 (A) 37.7 - 47.9 %   MCV 95.1 80 - 97 fL   MCH, POC 31.2 27 - 31.2 pg   MCHC 32.8 31.8 - 35.4 g/dL   RDW, POC 13.3 %   Platelet Count, POC 286 142  - 424 K/uL   MPV 6.9 0 - 99.8 fL   Spirometry Readings: 220, 250  [facility] reading (PRIMARY) by  Dr. Ouida Sills. Air trapping present.       Assessment & Plan:  1. Wheezing 2. Cough - POCT CBC - DG Chest 2 View; Future - albuterol (PROVENTIL) (2.5 MG/3ML) 0.083% nebulizer solution 2.5 mg; Take 3 mLs (2.5 mg total) by nebulization once. - ipratropium (ATROVENT) nebulizer solution 0.5 mg; Take 2.5 mLs (0.5 mg total) by nebulization once. - albuterol (PROVENTIL HFA;VENTOLIN HFA) 108 (90 BASE) MCG/ACT inhaler; Inhale 1-2 puffs into the lungs every 6 (six) hours as needed for wheezing or shortness of breath.  Dispense: 1 Inhaler; Refill: 0  3. LRTI (lower respiratory tract infection) - albuterol (PROVENTIL HFA;VENTOLIN HFA) 108 (90 BASE) MCG/ACT inhaler; Inhale 1-2 puffs into the lungs every 6 (six) hours as needed for wheezing or shortness of breath.  Dispense: 1 Inhaler; Refill: 0 - ipratropium (ATROVENT) 0.03 % nasal spray; Place 2 sprays into both nostrils 2 (two) times daily.  Dispense: 30 mL; Refill: 0 - levofloxacin (LEVAQUIN) 500 MG tablet; Take 1 tablet (500 mg total) by mouth daily.  Dispense: 7 tablet; Refill: 0   Samantha Wedemeyer PA-C  [facility] 01/25/2014 5:37 PM

## 2014-04-08 ENCOUNTER — Encounter: Payer: Self-pay | Admitting: Internal Medicine

## 2014-04-08 ENCOUNTER — Ambulatory Visit (INDEPENDENT_AMBULATORY_CARE_PROVIDER_SITE_OTHER): Payer: BC Managed Care – PPO | Admitting: Internal Medicine

## 2014-04-08 VITALS — BP 112/76 | HR 74 | Temp 98.6°F | Resp 12 | Wt 161.8 lb

## 2014-04-08 DIAGNOSIS — E038 Other specified hypothyroidism: Secondary | ICD-10-CM

## 2014-04-08 DIAGNOSIS — E063 Autoimmune thyroiditis: Secondary | ICD-10-CM

## 2014-04-08 LAB — T4, FREE: FREE T4: 0.84 ng/dL (ref 0.60–1.60)

## 2014-04-08 LAB — TSH: TSH: 58.73 u[IU]/mL — AB (ref 0.35–4.50)

## 2014-04-08 MED ORDER — THYROID 120 MG PO TABS: 120.0000 mg | ORAL_TABLET | Freq: Every day | ORAL | Status: DC

## 2014-04-08 NOTE — Patient Instructions (Signed)
Please stop at the lab - we will check a TSH and free t4.  Please schedule a lab appt in 5-6 weeks.  Please return in 6 months with your sugar log.

## 2014-04-08 NOTE — Progress Notes (Signed)
Patient ID: Samantha Golden, female   DOB: January 19, 1966, 49 y.o.   MRN: [id number]   HPI  Samantha Golden is a 49 y.o.-year-old female, initially referred by Dr Phineas Real, for management of uncontrolled Hashimoto's hypothyroidism. Last visit 4 mo ago.  Reviewed hx: Pt. has been dx with hypothyroidism in ~1990.   She is on Synthroid DAW 225 mcg >> 300 mcg >> 250 mcg, taken: - fasting,  - has coffee + sugar free vanilla creamer 30 min later - no b'fast during the weekdays; in the weekends >1h before b'fast - no calcium, multivitamins - if she takes them, it is at dinner - no iron, PPI  I reviewed pt's thyroid tests: Lab Results  Component Value Date   TSH 0.08* 12/06/2013   TSH 130.618* 10/23/2013   TSH 90.174* 03/10/2012   FREET4 1.75* 12/06/2013  TSH 4.26 in 2012.  She has been out of the LT4 for ~ 1 mo before the test in 10/2013.  Component     Latest Ref Rng 10/23/2013  Thyroid Peroxidase Antibody     <35.0 IU/mL 265.0 (H)  Thyroglobulin Ab     <40.0 IU/mL 38.2   Pt describes: - + fatigue - + weight gain - no cold intolerance - + heat intolerance (hot flushes) - + diarrhea/no constipation - no dry skin - + hair falling - no depression  She got palpitations when she took Adderall with the LT4 300.  Pt denies feeling nodules in neck, hoarseness, dysphagia/odynophagia, SOB with lying down.  She is using on a ketogenic diet.  ROS: Constitutional: see HPI Eyes: + blurry vision, no xerophthalmia ENT: no sore throat, no nodules palpated in throat, no dysphagia/odynophagia, no hoarseness Cardiovascular: no CP/+ SOB/no palpitations/leg swelling Respiratory: no cough/+ SOB Gastrointestinal: no N/V/+ D/no C Musculoskeletal: no muscle/joint aches Skin: no rashes, + easy bruising, + hair loss Neurological: + tremors/no numbness/tingling/dizziness  I reviewed pt's medications, allergies, PMH, social hx, family hx, and changes were documented in the history of present illness.  Otherwise, unchanged from my initial visit note:  Past Medical History  Diagnosis Date  . Hypothyroidism   . BRCA negative 08/09  . Cervical dysplasia   . Abnormal cervical Papanicolaou smear     ? LASER TREATMENT   . Allergy    Past Surgical History  Procedure Laterality Date  . Adenoidectomy    . Colposcopy    . Cervix lesion destruction    . Intrauterine device (iud) insertion  08/31/2012    ParaGard   History   Social History  . Marital Status: Divorced    Spouse Name: N/A    Number of Children: 3   Occupational History  . teacher   Social History Main Topics  . Smoking status: Former Research scientist (life sciences)  . Smokeless tobacco: Never Used  . Alcohol Use: 1.0 oz/week    2 drink(s) per week  . Drug Use: No  . Sexual Activity: Yes    Birth Control/ Protection: IUD     Comment: ParaGard 08/2012   Current Outpatient Prescriptions on File Prior to Visit  Medication Sig Dispense Refill  . albuterol (PROVENTIL HFA;VENTOLIN HFA) 108 (90 BASE) MCG/ACT inhaler Inhale 1-2 puffs into the lungs every 6 (six) hours as needed for wheezing or shortness of breath. 1 Inhaler 0  . levofloxacin (LEVAQUIN) 500 MG tablet Take 1 tablet (500 mg total) by mouth daily. 7 tablet 0  . SYNTHROID 200 MCG tablet Take 1 tablet (200 mcg total) by mouth daily before  breakfast. Along with the 50 mcg Synthroid tablet >> total 250 mcg daily 30 tablet 2  . SYNTHROID 50 MCG tablet Take 1 tablet (50 mcg total) by mouth daily before breakfast. Along with the 200 mcg Synthroid tablet >> total 250 mcg daily 30 tablet 2  . amphetamine-dextroamphetamine (ADDERALL XR) 20 MG 24 hr capsule Take 1-2 PO QAM, 1 PO QPM (Patient not taking: Reported on 04/08/2014) 90 capsule 0  . Calcium Carbonate-Vitamin D (CALCIUM + D PO) Take by mouth.      Marland Kitchen ipratropium (ATROVENT) 0.03 % nasal spray Place 2 sprays into both nostrils 2 (two) times daily. (Patient not taking: Reported on 04/08/2014) 30 mL 0  . Multiple Vitamins-Iron (MULTIVITAMIN/IRON  PO) Take by mouth.       Current Facility-Administered Medications on File Prior to Visit  Medication Dose Route Frequency Provider Last Rate Last Dose  . levonorgestrel (MIRENA) 20 MCG/24HR IUD   Intrauterine Once Anastasio Auerbach, MD       Allergies  Allergen Reactions  . Augmentin [Amoxicillin-Pot Clavulanate]   . Other     "CLEANING MATERIALS"   Family History  Problem Relation Age of Onset  . Breast cancer Mother     Age 78  . Cancer Paternal Aunt     Cervical  . Breast cancer Maternal Grandmother     Age 24's  . Diabetes Maternal Grandfather   . Diabetes Paternal Uncle   . Breast cancer Paternal Aunt     Age 100's   PE: BP 112/76 mmHg  Pulse 74  Temp(Src) 98.6 F (37 C) (Oral)  Resp 12  Wt 161 lb 12.8 oz (73.392 kg)  SpO2 99% Body mass index is 26.13 kg/(m^2).  Wt Readings from Last 3 Encounters:  04/08/14 161 lb 12.8 oz (73.392 kg)  01/25/14 151 lb (68.493 kg)  01/07/14 153 lb 6.4 oz (69.582 kg)   Constitutional: normal weight, in NAD Eyes: PERRLA, EOMI, no exophthalmos ENT: moist mucous membranes, no thyromegaly, no cervical lymphadenopathy Cardiovascular: RRR, No MRG Respiratory: CTA B Gastrointestinal: abdomen soft, NT, ND, BS+ Musculoskeletal: no deformities, strength intact in all 4 Skin: moist, warm, no rashes Neurological: + mild tremor with outstretched hands, DTR normal in all 4  ASSESSMENT: 1. Hypothyroidism  PLAN:  1. Patient with long-standing uncontrolled hypothyroidism, on Synthroid DAW therapy. She appears euthyroid. She does not appear to have a goiter, thyroid nodules, or neck compression symptoms - We discussed about correct intake of levothyroxine, fasting, with water, separated by at least 30 minutes from breakfast, and separated by more than 4 hours from calcium, iron, multivitamins, acid reflux medications (PPIs). She is now taking it in am. - will check thyroid tests today: TSH, free T4  - we discussed about Armour thyroid. She is  interested in this - we will switch Armour - I would first like for the TFTs to normalize before switching We again discussed about positive and negative aspects of using Armour thyroid. I underlined the fact that:  Armour is purified from porcine thyroid glands, which is not without risk for contaminants  Also, the ratio between T4 and T3 in Armour is physiologic for pigs, not for humans.  The short half life of T3 can cause fluctuations in blood levels, which can result in mood swings and heart rhythm abnormalities.  The concentration of the active substances (T4 and T3) can be expected to vary between different Armour lots, which can cause variation in the thyroid function tests.  - she agrees  to plan - I will see her back in 6 months  Office Visit on 04/08/2014  Component Date Value Ref Range Status  . Free T4 04/08/2014 0.84  0.60 - 1.60 ng/dL Final  . TSH 04/08/2014 58.73* 0.35 - 4.50 uIU/mL Final   TSH again high. I suspect noncompliance with the LT4 dose.  We'll attempt to switch to Armour at 120 mg ( equivalent to 200 mcg  LT4 daily ). Will recheck thyroid tests in 5 weeks.

## 2014-04-18 ENCOUNTER — Encounter: Payer: Self-pay | Admitting: Internal Medicine

## 2014-04-18 ENCOUNTER — Telehealth: Payer: Self-pay | Admitting: Internal Medicine

## 2014-04-18 NOTE — Telephone Encounter (Signed)
Called pt and lvm asking her to return call.

## 2014-04-18 NOTE — Telephone Encounter (Signed)
Larene Beach, can you please check with her?

## 2014-04-18 NOTE — Telephone Encounter (Signed)
Patient need to talk to about my chart and her medications. Please advise

## 2014-04-18 NOTE — Telephone Encounter (Signed)
error 

## 2014-05-13 ENCOUNTER — Telehealth: Payer: Self-pay | Admitting: *Deleted

## 2014-05-13 ENCOUNTER — Other Ambulatory Visit (INDEPENDENT_AMBULATORY_CARE_PROVIDER_SITE_OTHER): Payer: BC Managed Care – PPO

## 2014-05-13 ENCOUNTER — Ambulatory Visit: Payer: BC Managed Care – PPO | Admitting: Internal Medicine

## 2014-05-13 DIAGNOSIS — E063 Autoimmune thyroiditis: Secondary | ICD-10-CM

## 2014-05-13 DIAGNOSIS — E038 Other specified hypothyroidism: Secondary | ICD-10-CM

## 2014-05-13 LAB — TSH: TSH: 0.18 u[IU]/mL — ABNORMAL LOW (ref 0.35–4.50)

## 2014-05-13 LAB — T3, FREE: T3 FREE: 3.6 pg/mL (ref 2.3–4.2)

## 2014-05-13 LAB — T4, FREE: FREE T4: 0.64 ng/dL (ref 0.60–1.60)

## 2014-05-13 NOTE — Telephone Encounter (Signed)
Pt was in to have labs drawn. Pt stated that her medication dose is not working. Pt states she actually feels worse than she did prior to starting this. Pt is curious what her labs are and hopes that Dr Cruzita Lederer will change her dose. Be advised.

## 2014-05-29 ENCOUNTER — Ambulatory Visit (INDEPENDENT_AMBULATORY_CARE_PROVIDER_SITE_OTHER): Payer: BC Managed Care – PPO | Admitting: Family Medicine

## 2014-05-29 ENCOUNTER — Encounter: Payer: Self-pay | Admitting: Family Medicine

## 2014-05-29 ENCOUNTER — Ambulatory Visit: Payer: BC Managed Care – PPO | Admitting: Family Medicine

## 2014-05-29 VITALS — BP 125/78 | HR 80 | Temp 98.2°F | Resp 16 | Ht 66.5 in | Wt 156.0 lb

## 2014-05-29 DIAGNOSIS — J4521 Mild intermittent asthma with (acute) exacerbation: Secondary | ICD-10-CM | POA: Diagnosis not present

## 2014-05-29 DIAGNOSIS — J301 Allergic rhinitis due to pollen: Secondary | ICD-10-CM | POA: Diagnosis not present

## 2014-05-29 DIAGNOSIS — R05 Cough: Secondary | ICD-10-CM | POA: Diagnosis not present

## 2014-05-29 DIAGNOSIS — R058 Other specified cough: Secondary | ICD-10-CM

## 2014-05-29 MED ORDER — METHYLPREDNISOLONE ACETATE 80 MG/ML IJ SUSP
120.0000 mg | Freq: Once | INTRAMUSCULAR | Status: AC
  Administered 2014-05-29: 120 mg via INTRAMUSCULAR

## 2014-05-29 MED ORDER — FLUTICASONE PROPIONATE 50 MCG/ACT NA SUSP: 2.0000 | Freq: Every day | NASAL | Status: DC

## 2014-05-29 MED ORDER — DESLORATADINE 5 MG PO TABS: 5.0000 mg | ORAL_TABLET | Freq: Every day | ORAL | Status: DC

## 2014-05-29 MED ORDER — HYDROCODONE-HOMATROPINE 5-1.5 MG/5ML PO SYRP: 5.0000 mL | ORAL_SOLUTION | Freq: Three times a day (TID) | ORAL | Status: DC | PRN

## 2014-05-29 MED ORDER — PREDNISONE 10 MG PO TABS: ORAL_TABLET | ORAL | Status: DC

## 2014-05-29 NOTE — Patient Instructions (Signed)
I have prescribed Prednisone taper. The tablets are 10 mg each.  Start with 6 tablets on day 1, in three divided doses (2 tabs with breakfast, 2 with lunch and 2 with dinner). Take 1 less tablet each day, all doses with food or snack. This medication takes 6 days to complete.  Take the allergy medication daily and use the nasal spray every night. I have prescribed some cough medication that may cause drowsiness so you may want to take it just at bedtime.

## 2014-06-01 ENCOUNTER — Encounter: Payer: Self-pay | Admitting: Family Medicine

## 2014-06-01 NOTE — Progress Notes (Signed)
Subjective:    Patient ID: Samantha Golden, female    DOB: 13-May-1965, 49 y.o.   MRN: [id number]  HPI  This 49 y.o. Female presents w/ 3-4 weeks hx of persistent NP cough. She was treated at [facility] from late Sept through Nov 2015 for respiratory problems. Pt has hx of bronchitis and pneumonia in the remote past.   11/11/2013 treatment in clinic >> Neb tx w/ albuterol; CXR impression was increased bronchial markings w/o infiltrate.Z-PAK and Albuterol MDI prescribed; advised about OTC products to use prn. 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 Rapid Strep culture grew non-Strep B. Pt was given RX: Z-PAK but she did not start antibiotic until she received culture results. She had some improvement but returned to [facility] on 01/25/2014; received neb treatments, started on Levaquin and ipratropium nasal spray.  Pt returns to day w/ NP cough, some chest tightness and mild allergy symptoms. She has been using albuterol MDI daily. Not using ipratropium NS. She feels mildly fatigued due to lack of sleep associated w/ cough. Denies fever/chills, sore throat, difficulty swallowing, voice change, significant wheezing, n/v/d, rashes, myalgias, HA, dizziness, weakness or syncope.  Pt notes that both her children have significant allergies and asthma. She may have had mild childhood asthma condition.  Patient Active Problem List   Diagnosis Date Noted  . 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 Hypothyroidism due to Hashimoto's thyroiditis     Prior to Admission medications   Medication Sig Start Date End Date Taking? Authorizing Provider  albuterol (PROVENTIL HFA;VENTOLIN HFA) 108 (90 BASE) MCG/ACT inhaler Inhale 1-2 puffs into the lungs every 6 (six) hours as needed for wheezing or shortness of breath. 01/25/14  Yes Tishira R Brewington, PA-C  amphetamine-dextroamphetamine  (ADDERALL XR) 20 MG 24 hr capsule Take 1-2 PO QAM, 1 PO QPM 06/28/13  Yes Barton Fanny, MD  Multiple Vitamins-Iron (MULTIVITAMIN/IRON PO) Take by mouth.     Yes Historical Provider, MD  thyroid (ARMOUR THYROID) 120 MG tablet Take 1 tablet (120 mg total) by mouth daily before breakfast. 04/08/14  Yes Philemon Kingdom, MD  Calcium Carbonate-Vitamin D (CALCIUM + D PO) Take by mouth.      Historical Provider, MD    History   Social History  . Marital Status: Divorced    Spouse Name: N/A  . Number of Children: N/A  . Years of Education: N/A   Occupational History  . Not on file.   Social History Main Topics  . Smoking status: Former Smoker -- 10 years    Types: Cigarettes  . Smokeless tobacco: Never Used  . Alcohol Use: 1.0 oz/week    2 Standard drinks or equivalent per week  . Drug Use: No  . Sexual Activity: Yes    Birth Control/ Protection: IUD     Comment: ParaGard 08/2012   Other Topics Concern  . Not on file   Social History Narrative    Review of Systems As per HPI.     Objective:   Physical Exam  Constitutional: She is oriented to person, place, and time. Vital signs are normal. She appears well-developed and well-nourished. No distress.  Blood pressure 125/78, pulse 80, temperature 98.2 F (36.8 C), temperature source Oral, resp. rate 16, height 5' 6.5" (1.689 m), weight 156 lb (70.761 kg), last menstrual period 05/23/2014, SpO2 97 %.   HENT:  Head: Normocephalic  and atraumatic.  Right Ear: Hearing, tympanic membrane, external ear and ear canal normal.  Left Ear: Hearing, tympanic membrane, external ear and ear canal normal.  Nose: Mucosal edema present. No sinus tenderness, nasal deformity or septal deviation. Right sinus exhibits no maxillary sinus tenderness and no frontal sinus tenderness. Left sinus exhibits no maxillary sinus tenderness and no frontal sinus tenderness.  Eyes: EOM and lids are normal. Right conjunctiva is injected. Left conjunctiva is  injected. No scleral icterus.  Neck: Trachea normal, normal range of motion and phonation normal. Neck supple. No thyroid mass and no thyromegaly present.  Cardiovascular: Normal rate, regular rhythm and normal heart sounds.  Exam reveals no gallop and no friction rub.   No murmur heard. Pulmonary/Chest: Effort normal and breath sounds normal. No respiratory distress. She has no wheezes. She has no rales.  Musculoskeletal: Normal range of motion.  Lymphadenopathy:    She has no cervical adenopathy.  Neurological: She is alert and oriented to person, place, and time. No cranial nerve deficit.  Skin: Skin is warm and dry. No rash noted. She is not diaphoretic. No erythema.  Psychiatric: She has a normal mood and affect. Her behavior is normal. Judgment and thought content normal.  Nursing note and vitals reviewed.      Assessment & Plan:  Upper airway cough syndrome - RX: Hycodan syrup 5 mL every 8 hours prn. Plan: methylPREDNISolone acetate (DEPO-MEDROL) injection 120 mg  Allergic bronchitis, mild intermittent, with acute exacerbation - Prednisone taper over 6 days. Plan: methylPREDNISolone acetate (DEPO-MEDROL) injection 120 mg  Allergic rhinitis due to pollen- RX: Clarinex 5 mg  1 tablet every evening for allergies.  Use Fluticasone nasal spray at bedtime every night.   Meds ordered this encounter  Medications  . predniSONE (DELTASONE) 10 MG tablet    Sig: Take as directed; take 6 tabs in divided doses on day 1. Take 1 less tab each day.    Dispense:  21 tablet    Refill:  0  . desloratadine (CLARINEX) 5 MG tablet    Sig: Take 1 tablet (5 mg total) by mouth daily.    Dispense:  30 tablet    Refill:  5  . fluticasone (FLONASE) 50 MCG/ACT nasal spray    Sig: Place 2 sprays into both nostrils daily.    Dispense:  16 g    Refill:  5  . HYDROcodone-homatropine (HYCODAN) 5-1.5 MG/5ML syrup    Sig: Take 5 mLs by mouth every 8 (eight) hours as needed for cough.    Dispense:  120 mL      Refill:  0  . methylPREDNISolone acetate (DEPO-MEDROL) injection 120 mg    Sig:

## 2014-06-03 ENCOUNTER — Telehealth: Payer: Self-pay

## 2014-06-03 NOTE — Telephone Encounter (Signed)
Patient still not feeling a 100% states her lungs feel better but she is having a lot of drainage and yellow mucus. She finished the predizone that was given to her last week. Patient feels she needs a Zpack.  She is requesting it to be sent to [facility] and her call back number is [phone]. I did indicate to patient she may be require to RTC.

## 2014-06-03 NOTE — Telephone Encounter (Signed)
Please advise on ABX

## 2014-06-05 MED ORDER — AZITHROMYCIN 250 MG PO TABS: ORAL_TABLET | ORAL | Status: DC

## 2014-06-05 NOTE — Telephone Encounter (Signed)
I prescribed Z-PAK; it is at [facility] for pick up. If pt not improved after this medication, she needs to return for re-evaluation.

## 2014-06-05 NOTE — Telephone Encounter (Signed)
Spoke with pt, advised message from [place]. She would like Dr. Leward Quan to review. She does not have the money to come back in.

## 2014-06-05 NOTE — Telephone Encounter (Signed)
She very well may need an antibiotic, looking back through her previous visits it seems like she has had lung issues requiring different antibiotics in the past. I would feel most comfortable if she came back in so we can see her to determine if she needs abx and which one.

## 2014-06-05 NOTE — Telephone Encounter (Signed)
Left a detailed message letting pt know, her Rx was sent in.

## 2014-06-27 ENCOUNTER — Encounter: Payer: Self-pay | Admitting: Internal Medicine

## 2014-06-27 ENCOUNTER — Other Ambulatory Visit: Payer: Self-pay

## 2014-06-27 DIAGNOSIS — Z1231 Encounter for screening mammogram for malignant neoplasm of breast: Secondary | ICD-10-CM

## 2014-06-28 ENCOUNTER — Other Ambulatory Visit: Payer: Self-pay | Admitting: Internal Medicine

## 2014-06-28 DIAGNOSIS — E038 Other specified hypothyroidism: Secondary | ICD-10-CM

## 2014-06-28 DIAGNOSIS — E063 Autoimmune thyroiditis: Principal | ICD-10-CM

## 2014-06-28 MED ORDER — THYROID 120 MG PO TABS: 120.0000 mg | ORAL_TABLET | Freq: Every day | ORAL | Status: DC

## 2014-07-10 ENCOUNTER — Ambulatory Visit
Admission: RE | Admit: 2014-07-10 | Discharge: 2014-07-10 | Disposition: A | Discharge status: Home / Self Care | Payer: BC Managed Care – PPO | Source: Ambulatory Visit

## 2014-07-10 DIAGNOSIS — Z1231 Encounter for screening mammogram for malignant neoplasm of breast: Secondary | ICD-10-CM

## 2014-09-03 ENCOUNTER — Telehealth: Payer: Self-pay | Admitting: Gynecology

## 2014-09-03 ENCOUNTER — Other Ambulatory Visit (INDEPENDENT_AMBULATORY_CARE_PROVIDER_SITE_OTHER): Payer: BC Managed Care – PPO

## 2014-09-03 DIAGNOSIS — E063 Autoimmune thyroiditis: Secondary | ICD-10-CM

## 2014-09-03 DIAGNOSIS — E038 Other specified hypothyroidism: Secondary | ICD-10-CM

## 2014-09-03 LAB — T4, FREE: Free T4: 0.39 ng/dL — ABNORMAL LOW (ref 0.60–1.60)

## 2014-09-03 LAB — TSH: TSH: 4.84 u[IU]/mL — ABNORMAL HIGH (ref 0.35–4.50)

## 2014-09-03 LAB — T3, FREE: T3 FREE: 2.3 pg/mL (ref 2.3–4.2)

## 2014-09-03 NOTE — Telephone Encounter (Signed)
09/03/14-I recked pt benefits for removal of existing Mirena and insertion of new for contraception and advised pt that it would be covered under her $39 copay. Appt made with TF for this.wl

## 2014-09-04 ENCOUNTER — Encounter: Payer: Self-pay | Admitting: Gynecology

## 2014-09-04 ENCOUNTER — Ambulatory Visit (INDEPENDENT_AMBULATORY_CARE_PROVIDER_SITE_OTHER): Payer: BC Managed Care – PPO | Admitting: Gynecology

## 2014-09-04 ENCOUNTER — Telehealth: Payer: Self-pay | Admitting: Internal Medicine

## 2014-09-04 VITALS — BP 110/70

## 2014-09-04 DIAGNOSIS — Z30433 Encounter for removal and reinsertion of intrauterine contraceptive device: Secondary | ICD-10-CM | POA: Diagnosis not present

## 2014-09-04 DIAGNOSIS — Z113 Encounter for screening for infections with a predominantly sexual mode of transmission: Secondary | ICD-10-CM

## 2014-09-04 HISTORY — PX: INTRAUTERINE DEVICE (IUD) INSERTION: SHX5877

## 2014-09-04 LAB — RPR

## 2014-09-04 NOTE — Progress Notes (Signed)
Patient presents for ParaGard IUD removal and Mirena IUD placement. She initially had a Mirena IUD several years ago and did well with this but noticed weight gain and replaced with a ParaGard. She now is been having heavier more painful periods. She feels that her weight gain probably was thyroid related to her Hashimoto's thyroiditis that she is being treated for. She now wants to go ahead and retry a Mirena IUD.  She has read through the booklet, has no contraindications and signed the consent form. She currently is on a normal menses.  I reviewed the insertional process with her as well as the risks to include infection, either immediate or long-term, uterine perforation or migration requiring surgery to remove, other complications such as pain, hormonal side effects and possibility of failure with subsequent pregnancy.   Exam with Kim assistant Pelvic: External BUS vagina normal. Cervix normal with IUD string visualized and normal menstrual flow.. Uterus anteverted normal size shape contour midline mobile nontender. Adnexa without masses or tenderness.  Procedure: The cervix was visualized and the ParaGard IUD string was grasped with a [place] forceps, removed, shown to the patient and discarded. The cervix was then cleansed with Betadine, anterior lip grasped with a single-tooth tenaculum, the uterus was sounded and a Mirena IUD was placed according to manufacturer's recommendations without difficulty. The strings were trimmed. The patient tolerated well and will follow up in one month for a postinsertional check.  Lot number:  [id number]    Anastasio Auerbach MD, 12:03 PM 09/04/2014

## 2014-09-04 NOTE — Telephone Encounter (Signed)
Called pt and lvm advising her per Dr Ronnie Derby message, No the POC-IUD should not cause you to gain a lot of weight. Advised pt to call if she had any further questions.

## 2014-09-04 NOTE — Telephone Encounter (Signed)
no

## 2014-09-04 NOTE — Telephone Encounter (Signed)
pt called  Wanting a phone call back from nurse regarding lab results and some questions about medication please call @ [phone]

## 2014-09-04 NOTE — Telephone Encounter (Signed)
Returned pt's call. Pt stated that she had her labs done yesterday. Advised her that Dr Cruzita Lederer is out of the country. Pt stated that she has an appt with her OB-GYN to have a POC-IUD put in. Pt had an IUD a few years ago, the [place]. She gained 30 lbs while on it. Pt is concerned because she has Hashimoto's disease. She does not want to gain a lot of weight. She would like some advice ... If the dr feels that with her Hashimoto's disease and putting the POC-IUD in ... Would this cause her to gain a lot of weight? Please advise in Dr Arman Filter absence. Pt has appt today.

## 2014-09-04 NOTE — Patient Instructions (Signed)
Intrauterine Device Insertion Most often, an intrauterine device (IUD) is inserted into the uterus to prevent pregnancy. There are 2 types of IUDs available:  Copper IUD--This type of IUD creates an environment that is not favorable to sperm survival. The mechanism of action of the copper IUD is not known for certain. It can stay in place for 10 years.  Hormone IUD--This type of IUD contains the hormone progestin (synthetic progesterone). The progestin thickens the cervical mucus and prevents sperm from entering the uterus, and it also thins the uterine lining. There is no evidence that the hormone IUD prevents implantation. One hormone IUD can stay in place for up to 5 years, and a different hormone IUD can stay in place for up to 3 years. An IUD is the most cost-effective birth control if left in place for the full duration. It may be removed at any time. LET YOUR HEALTH CARE PROVIDER KNOW ABOUT:  Any allergies you have.  All medicines you are taking, including vitamins, herbs, eye drops, creams, and over-the-counter medicines.  Previous problems you or members of your family have had with the use of anesthetics.  Any blood disorders you have.  Previous surgeries you have had.  Possibility of pregnancy.  Medical conditions you have. RISKS AND COMPLICATIONS  Generally, intrauterine device insertion is a safe procedure. However, as with any procedure, complications can occur. Possible complications include:  Accidental puncture (perforation) of the uterus.  Accidental placement of the IUD either in the muscle layer of the uterus (myometrium) or outside the uterus. If this happens, the IUD can be found essentially floating around the bowels and must be taken out surgically.  The IUD may fall out of the uterus (expulsion). This is more common in women who have recently had a child.   Pregnancy in the fallopian tube (ectopic).  Pelvic inflammatory disease (PID), which is infection of  the uterus and fallopian tubes. The risk of PID is slightly increased in the first 20 days after the IUD is placed, but the overall risk is still very low. BEFORE THE PROCEDURE  Schedule the IUD insertion for when you will have your menstrual period or right after, to make sure you are not pregnant. Placement of the IUD is better tolerated shortly after a menstrual cycle.  You may need to take tests or be examined to make sure you are not pregnant.  You may be required to take a pregnancy test.  You may be required to get checked for sexually transmitted infections (STIs) prior to placement. Placing an IUD in someone who has an infection can make the infection worse.  You may be given a pain reliever to take 1 or 2 hours before the procedure.  An exam will be performed to determine the size and position of your uterus.  Ask your health care provider about changing or stopping your regular medicines. PROCEDURE   A tool (speculum) is placed in the vagina. This allows your health care provider to see the lower part of the uterus (cervix).  The cervix is prepped with a medicine that lowers the risk of infection.  You may be given a medicine to numb each side of the cervix (intracervical or paracervical block). This is used to block and control any discomfort with insertion.  A tool (uterine sound) is inserted into the uterus to determine the length of the uterine cavity and the direction the uterus may be tilted.  A slim instrument (IUD inserter) is inserted through the cervical   canal and into your uterus.  The IUD is placed in the uterine cavity and the insertion device is removed.  The nylon string that is attached to the IUD and used for eventual IUD removal is trimmed. It is trimmed so that it lays high in the vagina, just outside the cervix. AFTER THE PROCEDURE  You may have bleeding after the procedure. This is normal. It varies from light spotting for a few days to menstrual-like  bleeding.  You may have mild cramping. Document Released: 10/21/2010 Document Revised: 12/13/2012 Document Reviewed: 08/13/2012 ExitCare Patient Information 2015 [employer]. This information is not intended to replace advice given to you by your health care provider. Make sure you discuss any questions you have with your health care provider.  

## 2014-09-05 LAB — HIV ANTIBODY (ROUTINE TESTING W REFLEX): HIV 1&2 Ab, 4th Generation: NONREACTIVE

## 2014-09-05 LAB — HEPATITIS B SURFACE ANTIGEN: Hepatitis B Surface Ag: NEGATIVE

## 2014-09-05 LAB — GC/CHLAMYDIA PROBE AMP
CT Probe RNA: NEGATIVE
GC PROBE AMP APTIMA: NEGATIVE

## 2014-09-05 LAB — HEPATITIS C ANTIBODY: HCV AB: NEGATIVE

## 2014-09-06 ENCOUNTER — Encounter: Payer: Self-pay | Admitting: Internal Medicine

## 2014-09-10 ENCOUNTER — Other Ambulatory Visit: Payer: Self-pay | Admitting: Internal Medicine

## 2014-09-10 DIAGNOSIS — E038 Other specified hypothyroidism: Secondary | ICD-10-CM

## 2014-09-10 DIAGNOSIS — E063 Autoimmune thyroiditis: Principal | ICD-10-CM

## 2014-09-10 MED ORDER — ARMOUR THYROID 30 MG PO TABS: 30.0000 mg | ORAL_TABLET | Freq: Every day | ORAL | Status: DC

## 2014-09-16 ENCOUNTER — Other Ambulatory Visit: Payer: Self-pay | Admitting: Internal Medicine

## 2014-09-30 ENCOUNTER — Telehealth: Payer: Self-pay | Admitting: *Deleted

## 2014-09-30 NOTE — Telephone Encounter (Signed)
Pt aware the below note and said she will call back to schedule 1 month follow up.

## 2014-09-30 NOTE — Telephone Encounter (Signed)
Pt had ParaGard IUD removal and Mirena IUD placement on 09/04/14 states she has been spotting since then even had a couple of days of bleeding. Pt said since 09/04/14 she has maybe had 3 days of no bleeding. I explained to pt that can see spotting with Mirena sometimes and should subside, pt is not scheduled for 1 month follow up I will have her schedule. I told pt I would relay to you as well to confirm you agree with this as well. Please advise

## 2014-09-30 NOTE — Telephone Encounter (Signed)
I agree

## 2014-11-17 ENCOUNTER — Other Ambulatory Visit: Payer: Self-pay | Admitting: Internal Medicine

## 2014-11-18 MED ORDER — THYROID 120 MG PO TABS: ORAL_TABLET | ORAL | Status: DC

## 2015-01-23 ENCOUNTER — Telehealth: Payer: Self-pay | Admitting: Internal Medicine

## 2015-01-23 MED ORDER — ARMOUR THYROID 30 MG PO TABS: 30.0000 mg | ORAL_TABLET | Freq: Every day | ORAL | Status: DC

## 2015-01-23 MED ORDER — ARMOUR THYROID 120 MG PO TABS: ORAL_TABLET | ORAL | Status: DC

## 2015-01-23 NOTE — Telephone Encounter (Signed)
Patient need refill of Armour Thyroid.

## 2015-02-10 ENCOUNTER — Telehealth: Payer: Self-pay | Admitting: *Deleted

## 2015-02-10 NOTE — Telephone Encounter (Signed)
Pt called c/o Mirena IUD spotting/bleeding x 1 month, states this is new, has had mirena in past. Pt will schedule OV with provider to have exam

## 2015-03-26 ENCOUNTER — Other Ambulatory Visit (INDEPENDENT_AMBULATORY_CARE_PROVIDER_SITE_OTHER): Payer: BC Managed Care – PPO

## 2015-03-26 DIAGNOSIS — E063 Autoimmune thyroiditis: Secondary | ICD-10-CM

## 2015-03-26 DIAGNOSIS — E038 Other specified hypothyroidism: Secondary | ICD-10-CM

## 2015-03-26 LAB — TSH: TSH: 0.08 u[IU]/mL — AB (ref 0.35–4.50)

## 2015-03-26 LAB — T4, FREE: Free T4: 1.08 ng/dL (ref 0.60–1.60)

## 2015-03-26 LAB — T3, FREE: T3, Free: 6.7 pg/mL — ABNORMAL HIGH (ref 2.3–4.2)

## 2015-03-27 ENCOUNTER — Encounter: Payer: Self-pay | Admitting: Women's Health

## 2015-03-27 ENCOUNTER — Ambulatory Visit (INDEPENDENT_AMBULATORY_CARE_PROVIDER_SITE_OTHER): Payer: BC Managed Care – PPO | Admitting: Women's Health

## 2015-03-27 VITALS — BP 122/80 | Wt 146.0 lb

## 2015-03-27 DIAGNOSIS — Z13220 Encounter for screening for lipoid disorders: Secondary | ICD-10-CM | POA: Diagnosis not present

## 2015-03-27 DIAGNOSIS — B9689 Other specified bacterial agents as the cause of diseases classified elsewhere: Secondary | ICD-10-CM

## 2015-03-27 DIAGNOSIS — N760 Acute vaginitis: Secondary | ICD-10-CM | POA: Diagnosis not present

## 2015-03-27 DIAGNOSIS — N938 Other specified abnormal uterine and vaginal bleeding: Secondary | ICD-10-CM

## 2015-03-27 DIAGNOSIS — Z01419 Encounter for gynecological examination (general) (routine) without abnormal findings: Secondary | ICD-10-CM

## 2015-03-27 DIAGNOSIS — A499 Bacterial infection, unspecified: Secondary | ICD-10-CM

## 2015-03-27 LAB — CBC WITH DIFFERENTIAL/PLATELET
Basophils Absolute: 0 10*3/uL (ref 0.0–0.1)
Basophils Relative: 0 % (ref 0–1)
Eosinophils Absolute: 0.2 10*3/uL (ref 0.0–0.7)
Eosinophils Relative: 3 % (ref 0–5)
HEMATOCRIT: 45.5 % (ref 36.0–46.0)
HEMOGLOBIN: 15.2 g/dL — AB (ref 12.0–15.0)
LYMPHS ABS: 1.2 10*3/uL (ref 0.7–4.0)
Lymphocytes Relative: 24 % (ref 12–46)
MCH: 33.1 pg (ref 26.0–34.0)
MCHC: 33.4 g/dL (ref 30.0–36.0)
MCV: 99.1 fL (ref 78.0–100.0)
MONO ABS: 0.6 10*3/uL (ref 0.1–1.0)
MPV: 9.7 fL (ref 8.6–12.4)
Monocytes Relative: 11 % (ref 3–12)
Neutro Abs: 3.2 10*3/uL (ref 1.7–7.7)
Neutrophils Relative %: 62 % (ref 43–77)
Platelets: 222 10*3/uL (ref 150–400)
RBC: 4.59 MIL/uL (ref 3.87–5.11)
RDW: 13.5 % (ref 11.5–15.5)
WBC: 5.2 10*3/uL (ref 4.0–10.5)

## 2015-03-27 LAB — COMPREHENSIVE METABOLIC PANEL
ALT: 24 U/L (ref 6–29)
AST: 29 U/L (ref 10–35)
Albumin: 4.1 g/dL (ref 3.6–5.1)
Alkaline Phosphatase: 42 U/L (ref 33–115)
BUN: 11 mg/dL (ref 7–25)
CHLORIDE: 100 mmol/L (ref 98–110)
CO2: 26 mmol/L (ref 20–31)
Calcium: 9.4 mg/dL (ref 8.6–10.2)
Creat: 0.46 mg/dL — ABNORMAL LOW (ref 0.50–1.10)
GLUCOSE: 93 mg/dL (ref 65–99)
POTASSIUM: 4 mmol/L (ref 3.5–5.3)
Sodium: 135 mmol/L (ref 135–146)
Total Bilirubin: 0.9 mg/dL (ref 0.2–1.2)
Total Protein: 6.7 g/dL (ref 6.1–8.1)

## 2015-03-27 LAB — LIPID PANEL
Cholesterol: 198 mg/dL (ref 125–200)
HDL: 70 mg/dL (ref >=46)
LDL CALC: 113 mg/dL (ref <130)
TRIGLYCERIDES: 73 mg/dL (ref <150)
Total CHOL/HDL Ratio: 2.8 Ratio (ref <=5.0)
VLDL: 15 mg/dL (ref <30)

## 2015-03-27 LAB — WET PREP FOR TRICH, YEAST, CLUE
Trich, Wet Prep: NONE SEEN
WBC WET PREP: NONE SEEN
Yeast Wet Prep HPF POC: NONE SEEN

## 2015-03-27 MED ORDER — MEGESTROL ACETATE 20 MG PO TABS: 20.0000 mg | ORAL_TABLET | Freq: Every day | ORAL | Status: DC

## 2015-03-27 MED ORDER — METRONIDAZOLE 0.75 % VA GEL: VAGINAL | Status: DC

## 2015-03-27 NOTE — Patient Instructions (Addendum)
Health Maintenance, Female Adopting a healthy lifestyle and getting preventive care can go a long way to promote health and wellness. Talk with your health care provider about what schedule of regular examinations is right for you. This is a good chance for you to check in with your provider about disease prevention and staying healthy. In between checkups, there are plenty of things you can do on your own. Experts have done a lot of research about which lifestyle changes and preventive measures are most likely to keep you healthy. Ask your health care provider for more information. WEIGHT AND DIET  Eat a healthy diet  Be sure to include plenty of vegetables, fruits, low-fat dairy products, and lean protein.  Do not eat a lot of foods high in solid fats, added sugars, or salt.  Get regular exercise. This is one of the most important things you can do for your health.  Most adults should exercise for at least 150 minutes each week. The exercise should increase your heart rate and make you sweat (moderate-intensity exercise).  Most adults should also do strengthening exercises at least twice a week. This is in addition to the moderate-intensity exercise.  Maintain a healthy weight  Body mass index (BMI) is a measurement that can be used to identify possible weight problems. It estimates body fat based on height and weight. Your health care provider can help determine your BMI and help you achieve or maintain a healthy weight.  For females 20 years of age and older:   A BMI below 18.5 is considered underweight.  A BMI of 18.5 to 24.9 is normal.  A BMI of 25 to 29.9 is considered overweight.  A BMI of 30 and above is considered obese.  Watch levels of cholesterol and blood lipids  You should start having your blood tested for lipids and cholesterol at 48 years of age, then have this test every 5 years.  You may need to have your cholesterol levels checked more often if:  Your lipid  or cholesterol levels are high.  You are older than 48 years of age.  You are at high risk for heart disease.  CANCER SCREENING   Lung Cancer  Lung cancer screening is recommended for adults 55-80 years old who are at high risk for lung cancer because of a history of smoking.  A yearly low-dose CT scan of the lungs is recommended for people who:  Currently smoke.  Have quit within the past 15 years.  Have at least a 30-pack-year history of smoking. A pack year is smoking an average of one pack of cigarettes a day for 1 year.  Yearly screening should continue until it has been 15 years since you quit.  Yearly screening should stop if you develop a health problem that would prevent you from having lung cancer treatment.  Breast Cancer  Practice breast self-awareness. This means understanding how your breasts normally appear and feel.  It also means doing regular breast self-exams. Let your health care provider know about any changes, no matter how small.  If you are in your 20s or 30s, you should have a clinical breast exam (CBE) by a health care provider every 1-3 years as part of a regular health exam.  If you are 40 or older, have a CBE every year. Also consider having a breast X-ray (mammogram) every year.  If you have a family history of breast cancer, talk to your health care provider about genetic screening.  If you   are at high risk for breast cancer, talk to your health care provider about having an MRI and a mammogram every year.  Breast cancer gene (BRCA) assessment is recommended for women who have family members with BRCA-related cancers. BRCA-related cancers include:  Breast.  Ovarian.  Tubal.  Peritoneal cancers.  Results of the assessment will determine the need for genetic counseling and BRCA1 and BRCA2 testing. Cervical Cancer Your health care provider may recommend that you be screened regularly for cancer of the pelvic organs (ovaries, uterus, and  vagina). This screening involves a pelvic examination, including checking for microscopic changes to the surface of your cervix (Pap test). You may be encouraged to have this screening done every 3 years, beginning at age 21.  For women ages 30-65, health care providers may recommend pelvic exams and Pap testing every 3 years, or they may recommend the Pap and pelvic exam, combined with testing for human papilloma virus (HPV), every 5 years. Some types of HPV increase your risk of cervical cancer. Testing for HPV may also be done on women of any age with unclear Pap test results.  Other health care providers may not recommend any screening for nonpregnant women who are considered low risk for pelvic cancer and who do not have symptoms. Ask your health care provider if a screening pelvic exam is right for you.  If you have had past treatment for cervical cancer or a condition that could lead to cancer, you need Pap tests and screening for cancer for at least 20 years after your treatment. If Pap tests have been discontinued, your risk factors (such as having a new sexual partner) need to be reassessed to determine if screening should resume. Some women have medical problems that increase the chance of getting cervical cancer. In these cases, your health care provider may recommend more frequent screening and Pap tests. Colorectal Cancer  This type of cancer can be detected and often prevented.  Routine colorectal cancer screening usually begins at 48 years of age and continues through 48 years of age.  Your health care provider may recommend screening at an earlier age if you have risk factors for colon cancer.  Your health care provider may also recommend using home test kits to check for hidden blood in the stool.  A small camera at the end of a tube can be used to examine your colon directly (sigmoidoscopy or colonoscopy). This is done to check for the earliest forms of colorectal  cancer.  Routine screening usually begins at age 50.  Direct examination of the colon should be repeated every 5-10 years through 48 years of age. However, you may need to be screened more often if early forms of precancerous polyps or small growths are found. Skin Cancer  Check your skin from head to toe regularly.  Tell your health care provider about any new moles or changes in moles, especially if there is a change in a mole's shape or color.  Also tell your health care provider if you have a mole that is larger than the size of a pencil eraser.  Always use sunscreen. Apply sunscreen liberally and repeatedly throughout the day.  Protect yourself by wearing long sleeves, pants, a wide-brimmed hat, and sunglasses whenever you are outside. HEART DISEASE, DIABETES, AND HIGH BLOOD PRESSURE   High blood pressure causes heart disease and increases the risk of stroke. High blood pressure is more likely to develop in:  People who have blood pressure in the high end   of the normal range (130-139/85-89 mm Hg).  People who are overweight or obese.  People who are African American.  If you are 38-23 years of age, have your blood pressure checked every 3-5 years. If you are 61 years of age or older, have your blood pressure checked every year. You should have your blood pressure measured twice--once when you are at a hospital or clinic, and once when you are not at a hospital or clinic. Record the average of the two measurements. To check your blood pressure when you are not at a hospital or clinic, you can use:  An automated blood pressure machine at a pharmacy.  A home blood pressure monitor.  If you are between 45 years and 39 years old, ask your health care provider if you should take aspirin to prevent strokes.  Have regular diabetes screenings. This involves taking a blood sample to check your fasting blood sugar level.  If you are at a normal weight and have a low risk for diabetes,  have this test once every three years after 48 years of age.  If you are overweight and have a high risk for diabetes, consider being tested at a younger age or more often. PREVENTING INFECTION  Hepatitis B  If you have a higher risk for hepatitis B, you should be screened for this virus. You are considered at high risk for hepatitis B if:  You were born in a country where hepatitis B is common. Ask your health care provider which countries are considered high risk.  Your parents were born in a high-risk country, and you have not been immunized against hepatitis B (hepatitis B vaccine).  You have HIV or AIDS.  You use needles to inject street drugs.  You live with someone who has hepatitis B.  You have had sex with someone who has hepatitis B.  You get hemodialysis treatment.  You take certain medicines for conditions, including cancer, organ transplantation, and autoimmune conditions. Hepatitis C  Blood testing is recommended for:  Everyone born from [birth year] through 1963.  Anyone with known risk factors for hepatitis C. Sexually transmitted infections (STIs)  You should be screened for sexually transmitted infections (STIs) including gonorrhea and chlamydia if:  You are sexually active and are younger than 48 years of age.  You are older than 48 years of age and your health care provider tells you that you are at risk for this type of infection.  Your sexual activity has changed since you were last screened and you are at an increased risk for chlamydia or gonorrhea. Ask your health care provider if you are at risk.  If you do not have HIV, but are at risk, it may be recommended that you take a prescription medicine daily to prevent HIV infection. This is called pre-exposure prophylaxis (PrEP). You are considered at risk if:  You are sexually active and do not regularly use condoms or know the HIV status of your partner(s).  You take drugs by injection.  You are sexually  active with a partner who has HIV. Talk with your health care provider about whether you are at high risk of being infected with HIV. If you choose to begin PrEP, you should first be tested for HIV. You should then be tested every 3 months for as long as you are taking PrEP.  PREGNANCY   If you are premenopausal and you may become pregnant, ask your health care provider about preconception counseling.  If you may  become pregnant, take 400 to 800 micrograms (mcg) of folic acid every day.  If you want to prevent pregnancy, talk to your health care provider about birth control (contraception). OSTEOPOROSIS AND MENOPAUSE   Osteoporosis is a disease in which the bones lose minerals and strength with aging. This can result in serious bone fractures. Your risk for osteoporosis can be identified using a bone density scan.  If you are 65 years of age or older, or if you are at risk for osteoporosis and fractures, ask your health care provider if you should be screened.  Ask your health care provider whether you should take a calcium or vitamin D supplement to lower your risk for osteoporosis.  Menopause may have certain physical symptoms and risks.  Hormone replacement therapy may reduce some of these symptoms and risks. Talk to your health care provider about whether hormone replacement therapy is right for you.  HOME CARE INSTRUCTIONS   Schedule regular health, dental, and eye exams.  Stay current with your immunizations.   Do not use any tobacco products including cigarettes, chewing tobacco, or electronic cigarettes.  If you are pregnant, do not drink alcohol.  If you are breastfeeding, limit how much and how often you drink alcohol.  Limit alcohol intake to no more than 1 drink per day for nonpregnant women. One drink equals 12 ounces of beer, 5 ounces of wine, or 1 ounces of hard liquor.  Do not use street drugs.  Do not share needles.  Ask your health care provider for help if  you need support or information about quitting drugs.  Tell your health care provider if you often feel depressed.  Tell your health care provider if you have ever been abused or do not feel safe at home.   This information is not intended to replace advice given to you by your health care provider. Make sure you discuss any questions you have with your health care provider.   Document Released: 09/07/2010 Document Revised: 03/15/2014 Document Reviewed: 01/24/2013 Elsevier Interactive Patient Education 2016 [employer] Bacterial Vaginosis Bacterial vaginosis is a vaginal infection that occurs when the normal balance of bacteria in the vagina is disrupted. It results from an overgrowth of certain bacteria. This is the most common vaginal infection in women of childbearing age. Treatment is important to prevent complications, especially in pregnant women, as it can cause a premature delivery. CAUSES  Bacterial vaginosis is caused by an increase in harmful bacteria that are normally present in smaller amounts in the vagina. Several different kinds of bacteria can cause bacterial vaginosis. However, the reason that the condition develops is not fully understood. RISK FACTORS Certain activities or behaviors can put you at an increased risk of developing bacterial vaginosis, including:  Having a new sex partner or multiple sex partners.  Douching.  Using an intrauterine device (IUD) for contraception. Women do not get bacterial vaginosis from toilet seats, bedding, swimming pools, or contact with objects around them. SIGNS AND SYMPTOMS  Some women with bacterial vaginosis have no signs or symptoms. Common symptoms include:  Grey vaginal discharge.  A fishlike odor with discharge, especially after sexual intercourse.  Itching or burning of the vagina and vulva.  Burning or pain with urination. DIAGNOSIS  Your health care provider will take a medical history and examine the vagina for  signs of bacterial vaginosis. A sample of vaginal fluid may be taken. Your health care provider will look at this sample under a microscope to check for bacteria   and abnormal cells. A vaginal pH test may also be done.  TREATMENT  Bacterial vaginosis may be treated with antibiotic medicines. These may be given in the form of a pill or a vaginal cream. A second round of antibiotics may be prescribed if the condition comes back after treatment. Because bacterial vaginosis increases your risk for sexually transmitted diseases, getting treated can help reduce your risk for chlamydia, gonorrhea, HIV, and herpes. HOME CARE INSTRUCTIONS   Only take over-the-counter or prescription medicines as directed by your health care provider.  If antibiotic medicine was prescribed, take it as directed. Make sure you finish it even if you start to feel better.  Tell all sexual partners that you have a vaginal infection. They should see their health care provider and be treated if they have problems, such as a mild rash or itching.  During treatment, it is important that you follow these instructions:  Avoid sexual activity or use condoms correctly.  Do not douche.  Avoid alcohol as directed by your health care provider.  Avoid breastfeeding as directed by your health care provider. SEEK MEDICAL CARE IF:   Your symptoms are not improving after 3 days of treatment.  You have increased discharge or pain.  You have a fever. MAKE SURE YOU:   Understand these instructions.  Will watch your condition.  Will get help right away if you are not doing well or get worse. FOR MORE INFORMATION  Centers for Disease Control and Prevention, Division of STD Prevention: [employer] American Sexual Health Association (ASHA): www.ashastd.org    This information is not intended to replace advice given to you by your health care provider. Make sure you discuss any questions you have with your health care provider.    Document Released: 02/22/2005 Document Revised: 03/15/2014 Document Reviewed: 10/04/2012 Elsevier Interactive Patient Education [employer].

## 2015-03-27 NOTE — Progress Notes (Signed)
Samantha Golden 08-30-1965 [id number]    History:    Presents for annual exam.  Mirena IUD placed 08/2014 and has had some irregular bleeding occasional vaginal odor. Hypothyroid and is in the process of getting thyroid adjusted. History of cryo-in her 41s with normal Paps after, Pap normal with negative HR HPV 2014. Normal mammogram history. Mother breast cancer age 48 maternal grandmother in her 41s, patient is BRCA negative.  Past medical history, past surgical history, family history and social history were all reviewed and documented in the EPIC chart. First grade teacher, 3 sons 2 in college, 1 Junior in high school all doing well.  ROS:  A ROS was performed and pertinent positives and negatives are included.  Exam:  Filed Vitals:   03/27/15 0803  BP: 122/80    General appearance:  Normal Thyroid:  Symmetrical, normal in size, without palpable masses or nodularity. Respiratory  Auscultation:  Clear without wheezing or rhonchi Cardiovascular  Auscultation:  Regular rate, without rubs, murmurs or gallops  Edema/varicosities:  Not grossly evident Abdominal  Soft,nontender, without masses, guarding or rebound.  Liver/spleen:  No organomegaly noted  Hernia:  None appreciated  Skin  Inspection:  Grossly normal   Breasts: Examined lying and sitting.     Right: Without masses, retractions, discharge or axillary adenopathy.     Left: Without masses, retractions, discharge or axillary adenopathy. Gentitourinary   Inguinal/mons:  Normal without inguinal adenopathy  External genitalia:  Normal  BUS/Urethra/Skene's glands:  Normal  Vagina:  Normal moderate menses type blood, wet prep positive for moderate clues, TNTC bacteria  Cervix:  Normal IUD strings visible  Uterus:   normal in size, shape and contour.  Midline and mobile  Adnexa/parametria:     Rt: Without masses or tenderness.   Lt: Without masses or tenderness.  Anus and perineum: Normal  Digital rectal exam: Normal  sphincter tone without palpated masses or tenderness  Assessment/Plan:  50 y.o. MWF G3 P3 for annual exam with complaint of irregular bleeding and vaginal odor.  07/2014 Mirena IUD with spotting Bacteria vaginosis Hypothyroid-Dr.Gherghe  Plan: MetroGel vaginal cream 1 applicator at bedtime 5, alcohol precautions reviewed. Instructed to call if no relief of odor. Megace 20 mg twice daily for 5 days, instructed to call if no relief of bleeding. SBE's, continue annual 3-D screening mammogram history of dense breasts. Increase regular exercise, calcium rich diet, vitamin D 1000 daily encouraged. CBC, lipid panel, CMP, T3, T4, thyroid antibodies, TSH. Pap normal 2014 was negative HR HPV, new screening guidelines reviewed. Instructed to print out thyroid panel and take to office appointment with Dr. Cruzita Lederer.      Huel Cote [facility], 9:06 AM 03/27/2015

## 2015-03-28 LAB — URINALYSIS W MICROSCOPIC + REFLEX CULTURE
BILIRUBIN URINE: NEGATIVE
Bacteria, UA: NONE SEEN [HPF]
Casts: NONE SEEN [LPF]
Crystals: NONE SEEN [HPF]
Glucose, UA: NEGATIVE
NITRITE: NEGATIVE
PH: 6 (ref 5.0–8.0)
SPECIFIC GRAVITY, URINE: 1.019 (ref 1.001–1.035)
Yeast: NONE SEEN [HPF]

## 2015-03-29 LAB — URINE CULTURE
COLONY COUNT: NO GROWTH
ORGANISM ID, BACTERIA: NO GROWTH

## 2015-03-30 ENCOUNTER — Encounter: Payer: Self-pay | Admitting: Women's Health

## 2015-05-12 ENCOUNTER — Encounter: Payer: Self-pay | Admitting: Physician Assistant

## 2015-05-12 ENCOUNTER — Ambulatory Visit (INDEPENDENT_AMBULATORY_CARE_PROVIDER_SITE_OTHER): Payer: BC Managed Care – PPO | Admitting: Physician Assistant

## 2015-05-12 ENCOUNTER — Telehealth: Payer: Self-pay | Admitting: *Deleted

## 2015-05-12 VITALS — BP 140/88 | HR 88 | Temp 97.6°F | Resp 17 | Ht 66.75 in | Wt 143.0 lb

## 2015-05-12 DIAGNOSIS — R079 Chest pain, unspecified: Secondary | ICD-10-CM

## 2015-05-12 DIAGNOSIS — R0602 Shortness of breath: Secondary | ICD-10-CM | POA: Diagnosis not present

## 2015-05-12 DIAGNOSIS — R198 Other specified symptoms and signs involving the digestive system and abdomen: Secondary | ICD-10-CM

## 2015-05-12 DIAGNOSIS — R0989 Other specified symptoms and signs involving the circulatory and respiratory systems: Secondary | ICD-10-CM

## 2015-05-12 DIAGNOSIS — R6889 Other general symptoms and signs: Secondary | ICD-10-CM

## 2015-05-12 LAB — POCT CBC
Granulocyte percent: 70.6 %G (ref 37–80)
HEMATOCRIT: 45.1 % (ref 37.7–47.9)
HEMOGLOBIN: 15.6 g/dL (ref 12.2–16.2)
LYMPH, POC: 1.6 (ref 0.6–3.4)
MCH, POC: 33.5 pg — AB (ref 27–31.2)
MCHC: 34.5 g/dL (ref 31.8–35.4)
MCV: 97 fL (ref 80–97)
MID (CBC): 0.6 (ref 0–0.9)
MPV: 7 fL (ref 0–99.8)
POC Granulocyte: 5.2 (ref 2–6.9)
POC LYMPH %: 21.5 % (ref 10–50)
POC MID %: 7.9 % (ref 0–12)
Platelet Count, POC: 205 10*3/uL (ref 142–424)
RBC: 4.65 M/uL (ref 4.04–5.48)
RDW, POC: 13.2 %
WBC: 7.4 10*3/uL (ref 4.6–10.2)

## 2015-05-12 LAB — COMPREHENSIVE METABOLIC PANEL
ALK PHOS: 46 U/L (ref 33–115)
ALT: 40 U/L — AB (ref 6–29)
AST: 34 U/L (ref 10–35)
Albumin: 4.4 g/dL (ref 3.6–5.1)
BILIRUBIN TOTAL: 0.6 mg/dL (ref 0.2–1.2)
BUN: 10 mg/dL (ref 7–25)
CALCIUM: 9.6 mg/dL (ref 8.6–10.2)
CO2: 23 mmol/L (ref 20–31)
Chloride: 105 mmol/L (ref 98–110)
Creat: 0.52 mg/dL (ref 0.50–1.10)
GLUCOSE: 81 mg/dL (ref 65–99)
Potassium: 4.1 mmol/L (ref 3.5–5.3)
Sodium: 140 mmol/L (ref 135–146)
TOTAL PROTEIN: 7.1 g/dL (ref 6.1–8.1)

## 2015-05-12 LAB — GLUCOSE, POCT (MANUAL RESULT ENTRY): POC Glucose: 92 mg/dl (ref 70–99)

## 2015-05-12 LAB — TSH: TSH: 0.05 m[IU]/L — AB

## 2015-05-12 MED ORDER — OMEPRAZOLE 20 MG PO CPDR: 20.0000 mg | DELAYED_RELEASE_CAPSULE | Freq: Every day | ORAL | Status: DC

## 2015-05-12 NOTE — Progress Notes (Signed)
[facility] [address] [place] [address] [phone]  Date:  05/12/2015   Name:  Samantha Golden   DOB:  Apr 11, 1965   MRN:  [id number]  PCP:  Anastasio Auerbach, MD    Chief Complaint: Chest Pain; Shortness of Breath; and Tremors   History of Present Illness:  This is a 50 y.o. female with PMH hypothyroidism, dysfunctional uterine bleeding who is presenting with cp that occurred 1.5 hours ago. She was at work (she is a Pharmacist, hospital) when she started getting a "tearing" sensation across her left chest. She then felt there was a knot in her throat and had a funny taste in her mouth. She had assoc sob and blurred vision. No diaphoresis or radiation of pain. Episode lasted about 30 minutes. She was seen by a nurse at her school who told her her pulse was "low and erratic". Pt left and called her PCP who is her GYN, who told her to come here. Most symptoms have resolved at this point. She states she feels a little groggy now and have mild tightness in throat. She has been in her normal health. No new meds. Had 1/2 cup coffee this AM (normal for her) and some water. She has had a mild back ache the past couple days but feels better today. She does not have a fam hx of MI or CAD. She is not a smoker, former smoker quit 5-10 years ago. She exercises occ - no cp or sob with this. She has never had a cardiology work up before. States she has had 2 episodes similar to this one over the past 1-2 years. Never seen for these. She states this episode seemed worse. No hx heartburn. Did not have any food this AM.  Not feeling stressed before this happened. Never had panic attack before.  Review of Systems:  Review of Systems See HPI  Patient Active Problem List   Diagnosis Date Noted  . 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 Hypothyroidism due to Hashimoto's thyroiditis     Prior to Admission medications   Medication Sig Start Date End Date  Taking? Authorizing Provider  amphetamine-dextroamphetamine (ADDERALL XR) 20 MG 24 hr capsule Take 1-2 PO QAM, 1 PO QPM 06/28/13   Barton Fanny, MD  ARMOUR THYROID 120 MG tablet TAKE 1 TABLET BEFORE BREAKFAST. 01/23/15   Philemon Kingdom, MD  ARMOUR THYROID 30 MG tablet Take 1 tablet (30 mg total) by mouth daily before breakfast. Take each a.m. along with the 120 mg tablet, for a total dose of 150 mg daily 01/23/15   Philemon Kingdom, MD  Calcium Carbonate-Vitamin D (CALCIUM + D PO) Take by mouth.      Historical Provider, MD  megestrol (MEGACE) 20 MG tablet Take 1 tablet (20 mg total) by mouth daily. 03/27/15   Huel Cote, NP  metroNIDAZOLE (METROGEL VAGINAL) 0.75 % vaginal gel 1 applicator per vagina at HS x 5 03/27/15   Huel Cote, NP  Multiple Vitamins-Iron (MULTIVITAMIN/IRON PO) Take by mouth.      Historical Provider, MD    Allergies  Allergen Reactions  . Augmentin [Amoxicillin-Pot Clavulanate]   . Other     "CLEANING MATERIALS"    Past Surgical History  Procedure Laterality Date  . Adenoidectomy    . Colposcopy    . Cervix lesion destruction    . Intrauterine device (iud) insertion  09/04/2014    Mirena    Social History  Substance Use Topics  . Smoking status: Former Smoker -- 10 years    Types: Cigarettes  . Smokeless tobacco: Never Used  . Alcohol Use: 1.0 oz/week    2 Standard drinks or equivalent per week    Family History  Problem Relation Age of Onset  . Breast cancer Mother     Age 97  . Cancer Paternal Aunt     Cervical  . Breast cancer Maternal Grandmother     Age 60's  . Diabetes Maternal Grandfather   . Diabetes Paternal Uncle   . Breast cancer Paternal Aunt     Age 61's    Medication list has been reviewed and updated.  Physical Examination:  Physical Exam  Constitutional: She is oriented to person, place, and time. She appears well-developed and well-nourished. No distress.  HENT:  Head: Normocephalic and atraumatic.  Right Ear:  Hearing, tympanic membrane, external ear and ear canal normal.  Left Ear: Hearing, tympanic membrane, external ear and ear canal normal.  Nose: Nose normal.  Mouth/Throat: Uvula is midline, oropharynx is clear and moist and mucous membranes are normal.  Eyes: Conjunctivae, EOM and lids are normal. Pupils are equal, round, and reactive to light. Right eye exhibits no discharge. Left eye exhibits no discharge. No scleral icterus.  Neck: Trachea normal. Carotid bruit is not present. No thyromegaly present.  Cardiovascular: Normal rate, regular rhythm, normal heart sounds and normal pulses.   No murmur heard. Pulmonary/Chest: Effort normal and breath sounds normal. No respiratory distress. She has no wheezes. She has no rhonchi. She has no rales. She exhibits tenderness (mild, left sided, anterior).  Abdominal: Soft. Normal appearance. There is no tenderness.  Musculoskeletal: Normal range of motion.  Lymphadenopathy:       Head (right side): No submental, no submandibular and no tonsillar adenopathy present.       Head (left side): No submental, no submandibular and no tonsillar adenopathy present.    She has no cervical adenopathy.  Neurological: She is alert and oriented to person, place, and time. She has normal strength and normal reflexes. No cranial nerve deficit or sensory deficit. Gait normal.  Skin: Skin is warm, dry and intact. No lesion and no rash noted.  Psychiatric: She has a normal mood and affect. Her speech is normal and behavior is normal. Thought content normal.   BP 140/88 mmHg  Pulse 88  Temp(Src) 97.6 F (36.4 C) (Oral)  Resp 17  Ht 5' 6.75" (1.695 m)  Wt 143 lb (64.864 kg)  BMI 22.58 kg/m2  LMP    Pulse ox 98%   EKG interpreted with Dr. Brigitte Pulse: t wave inversion V1 only. Biphasic p waves throughout, possible atrial enlargement.  Results for orders placed or performed in visit on 05/12/15  POCT CBC  Result Value Ref Range   WBC 7.4 4.6 - 10.2 K/uL   Lymph, poc 1.6  0.6 - 3.4   POC LYMPH PERCENT 21.5 10 - 50 %L   MID (cbc) 0.6 0 - 0.9   POC MID % 7.9 0 - 12 %M   POC Granulocyte 5.2 2 - 6.9   Granulocyte percent 70.6 37 - 80 %G   RBC 4.65 4.04 - 5.48 M/uL   Hemoglobin 15.6 12.2 - 16.2 g/dL   HCT, POC 45.1 37.7 - 47.9 %   MCV 97.0 80 - 97 fL   MCH, POC 33.5 (A) 27 - 31.2 pg   MCHC 34.5 31.8 - 35.4 g/dL   RDW, POC 13.2 %  Platelet Count, POC 205 142 - 424 K/uL   MPV 7.0 0 - 99.8 fL  POCT glucose (manual entry)  Result Value Ref Range   POC Glucose 92 70 - 99 mg/dl    Assessment and Plan:  1. Chest pain, unspecified chest pain type 2. Throat tightness 3. Sob  Symptoms resolved at this point. Labs, EKG and exam normal. Suspect possible MSK injury vs GERD vs resolved arrhythmia. Treat with tylenol and omeprazole. Referred to cardiology to rule out cardiac etiology. Discussed case with Dr. Brigitte Pulse. Return if sx recur.  - EKG 12-Lead - TSH - POCT CBC - POCT glucose (manual entry) - Comprehensive metabolic panel - Ambulatory referral to Cardiology - omeprazole (PRILOSEC) 20 MG capsule; Take 1 capsule (20 mg total) by mouth daily.  Dispense: 30 capsule; Refill: 3    Demond Shallenberger V. Drenda Freeze, MHS [facility]  05/12/2015

## 2015-05-12 NOTE — Patient Instructions (Addendum)
Because you received labwork today, you will receive an invoice from [employer]. Please contact [facility] at [phone] with questions or concerns regarding your invoice. Our billing staff will not be able to assist you with those questions.  You will be contacted with the lab results as soon as they are available. The fastest way to get your results is to activate your My Chart account. Instructions are located on the last page of this paperwork. If you have not heard from [place] regarding the results in 2 weeks, please contact this office.  Take omeprazole for heartburn once a day. May take ibuprofen or tylenol to treat a muscle strain. You will get a phone call to make appt with cardiologist. Return if symptoms recur

## 2015-05-12 NOTE — Telephone Encounter (Signed)
Pt called left message in triage voicemail  c/o shortness of breath, chest pain, I called pt back and advised pt to call 911 for help. Pt said she left from work due to this. I told pt to call 911 for EMS help to come to her, I advised pt not to drive and have them come to her. Pt asked if she could come to our office, I advised pt best to call 911, pt verbalized she understood.

## 2015-05-13 ENCOUNTER — Telehealth: Payer: Self-pay | Admitting: Physician Assistant

## 2015-05-13 NOTE — Telephone Encounter (Signed)
She states her endocrinologist told her to cut her thyroid armour in half. She is supposed to go for repeat lab test in 4 weeks. Could be cause of her symptoms yesterday. She states she is feeling better today.

## 2015-05-13 NOTE — Telephone Encounter (Signed)
lmom to cb to discuss results

## 2015-05-16 ENCOUNTER — Telehealth: Payer: Self-pay | Admitting: *Deleted

## 2015-05-16 DIAGNOSIS — N938 Other specified abnormal uterine and vaginal bleeding: Secondary | ICD-10-CM

## 2015-05-16 MED ORDER — MEGESTROL ACETATE 20 MG PO TABS: 20.0000 mg | ORAL_TABLET | Freq: Every day | ORAL | Status: DC

## 2015-05-16 NOTE — Telephone Encounter (Signed)
Please call, she has a Mirena IUD in, she could take Megace 20 mg daily until bleeding stops. Okay to call in refill.

## 2015-05-16 NOTE — Telephone Encounter (Signed)
Left on voicemail,. Rx sent.  

## 2015-05-16 NOTE — Telephone Encounter (Signed)
Pt was prescribed megace 20mg  once daily, states when she picked Rx up the directions were take half a tablet daily, not 1 full tab daily. Pt states the bleeding would slow down for couple of day, but not stop. Pt is bleeding today not heavy,asked if refill could be sent for megace 20 mg daily? Please advise

## 2015-05-26 ENCOUNTER — Telehealth: Payer: Self-pay

## 2015-05-26 NOTE — Telephone Encounter (Addendum)
Patient states ever since she got Mirena last summer she seems to have continuous spotting. Almost every time she wipes she sees blood. She has taken Megace and continues to spot. She said she is on her 3rd round of it. She said as soon as she misses one she starts to bleed.  She is concerned could there be another issue making her bleed?  What to rec?

## 2015-05-27 ENCOUNTER — Other Ambulatory Visit: Payer: Self-pay | Admitting: Women's Health

## 2015-05-27 DIAGNOSIS — N938 Other specified abnormal uterine and vaginal bleeding: Secondary | ICD-10-CM

## 2015-05-27 MED ORDER — ESTRADIOL 1 MG PO TABS: 1.0000 mg | ORAL_TABLET | Freq: Every day | ORAL | Status: DC

## 2015-05-27 NOTE — Telephone Encounter (Signed)
Telephone call, reviewed discussed with Dr. Phineas Real, will try  estradiol 1 mg daily for 1 month. Instructed to call if bleeding persists or bleeding occurs after stopping.

## 2015-05-27 NOTE — Telephone Encounter (Signed)
Telephone call states continues to spot on Megace, not heavy bleeding but persistent. First Mirena IUD no spotting/amenorrheic. This IUD in since summer 2016 and has had persistent spotting in spite of Megace. Will schedule sonohysterogram with Dr. Phineas Real

## 2015-05-27 NOTE — Telephone Encounter (Signed)
I would recommend before proceeding with sonohysterogram if she has had several trials of progesterone treatment that we may want to try estrogen supplementation to see if we cannot stabilize the endometrium. Recommend holding on the sonohysterogram and treating with estradiol 1 mg daily for 4 weeks and then stop it and see what she does.  Risk of estrogen is blood clots but at this low dose highly unlikely.

## 2015-07-01 ENCOUNTER — Telehealth: Payer: Self-pay | Admitting: Internal Medicine

## 2015-07-01 MED ORDER — ARMOUR THYROID 30 MG PO TABS: 30.0000 mg | ORAL_TABLET | Freq: Every day | ORAL | Status: DC

## 2015-07-01 MED ORDER — ARMOUR THYROID 120 MG PO TABS: ORAL_TABLET | ORAL | Status: DC

## 2015-07-01 NOTE — Telephone Encounter (Signed)
Patient need a refill of medication ARMOUR THYROID 120 MG tablet,

## 2015-08-11 ENCOUNTER — Encounter: Payer: Self-pay | Admitting: Internal Medicine

## 2015-08-11 ENCOUNTER — Ambulatory Visit (INDEPENDENT_AMBULATORY_CARE_PROVIDER_SITE_OTHER): Payer: BC Managed Care – PPO | Admitting: Internal Medicine

## 2015-08-11 VITALS — BP 118/60 | HR 74 | Temp 97.9°F | Resp 12 | Wt 143.0 lb

## 2015-08-11 DIAGNOSIS — E063 Autoimmune thyroiditis: Secondary | ICD-10-CM

## 2015-08-11 DIAGNOSIS — E038 Other specified hypothyroidism: Secondary | ICD-10-CM

## 2015-08-11 MED ORDER — ARMOUR THYROID 120 MG PO TABS: ORAL_TABLET | ORAL | Status: DC

## 2015-08-11 NOTE — Progress Notes (Addendum)
Patient ID: DAY GREB, female   DOB: September 03, 1965, 50 y.o.   MRN: [id number]   HPI  Samantha Golden is a 50 y.o.-year-old female, initially referred by Dr Phineas Real, for management of uncontrolled Hashimoto's hypothyroidism. Last visit 4 mo ago.  Since last visit, she had to go to the ED for palpitations and CP.At that time, she had taken an Adderrall along with the Armour.  Reviewed hx: Pt. has been dx with hypothyroidism in ~1990.   Previously on Synthroid DAW 225 mcg >> 300 mcg >> 250 mcg >> now on Armour 135 mg daily since 03/2015 << prev. 150 mg daily.  She takes Armour: - fasting - has coffee + sugar free vanilla creamer 45 min before Armour! - no b'fast during the weekdays; in the weekends >1h before b'fast - no calcium, multivitamins - if she takes them, it is at dinner - no iron, PPI  She missed the dose 2 days in a row last week.  I reviewed pt's thyroid tests: Lab Results  Component Value Date   TSH 0.05* 05/12/2015   TSH 0.08* 03/26/2015   TSH 4.84* 09/03/2014   TSH 0.18* 05/13/2014   TSH 58.73* 04/08/2014   TSH 0.08* 12/06/2013   TSH 130.618* 10/23/2013   TSH 90.174* 03/10/2012   FREET4 1.08 03/26/2015   FREET4 0.39* 09/03/2014   FREET4 0.64 05/13/2014   FREET4 0.84 04/08/2014   FREET4 1.75* 12/06/2013  TSH 4.26 in 2012.  She has been out of the LT4 for ~ 1 mo before the test in 10/2013.  Component     Latest Ref Rng 10/23/2013  Thyroid Peroxidase Antibody     <35.0 IU/mL 265.0 (H)  Thyroglobulin Ab     <40.0 IU/mL 38.2   Pt describes: - + fatigue - + weight loss - no cold intolerance - + heat intolerance (hot flushes) - no diarrhea/no constipation - no dry skin - no hair loss - no depression   She got palpitations when she took Adderall with the LT4 300 mcg.  ROS: Constitutional: see HPI Eyes: + blurry vision, no xerophthalmia ENT: no sore throat, no nodules palpated in throat, + dysphagia/no odynophagia, no hoarseness Cardiovascular: no  CP/+ SOB/+ palpitations/no leg swelling Respiratory: + cough/+ SOB Gastrointestinal: no N/V/+ D/+ C Musculoskeletal: no muscle/joint aches Skin: no rashes, + hair loss Neurological: + tremors/no numbness/tingling/dizziness  I reviewed pt's medications, allergies, PMH, social hx, family hx, and changes were documented in the history of present illness. Otherwise, unchanged from my initial visit note:  Past Medical History  Diagnosis Date  . BRCA negative 08/09  . Cervical dysplasia   . Abnormal cervical Papanicolaou smear     ? LASER TREATMENT   . Allergy    Past Surgical History  Procedure Laterality Date  . Adenoidectomy    . Colposcopy    . Cervix lesion destruction    . Intrauterine device (iud) insertion  09/04/2014    Mirena   History   Social History  . Marital Status: Divorced    Spouse Name: N/A    Number of Children: 3   Occupational History  . teacher   Social History Main Topics  . Smoking status: Former Research scientist (life sciences)  . Smokeless tobacco: Never Used  . Alcohol Use: 1.0 oz/week    2 drink(s) per week  . Drug Use: No  . Sexual Activity: Yes    Birth Control/ Protection: IUD     Comment: ParaGard 08/2012   Current Outpatient Prescriptions on File  Prior to Visit  Medication Sig Dispense Refill  . ARMOUR THYROID 120 MG tablet TAKE 1 TABLET BEFORE BREAKFAST. 45 tablet 1  . ARMOUR THYROID 30 MG tablet Take 1 tablet (30 mg total) by mouth daily before breakfast. Take each a.m. along with the 120 mg tablet, for a total dose of 150 mg daily (Patient taking differently: Take 15 mg by mouth daily before breakfast. Take each a.m. along with the 120 mg tablet, for a total dose of 150 mg daily) 45 tablet 1  . estradiol (ESTRACE) 1 MG tablet Take 1 tablet (1 mg total) by mouth daily. 30 tablet 0  . Calcium Carbonate-Vitamin D (CALCIUM + D PO) Take by mouth. Reported on 08/11/2015    . Multiple Vitamins-Iron (MULTIVITAMIN/IRON PO) Take by mouth. Reported on 08/11/2015     Current  Facility-Administered Medications on File Prior to Visit  Medication Dose Route Frequency Provider Last Rate Last Dose  . levonorgestrel (MIRENA) 20 MCG/24HR IUD   Intrauterine Once Anastasio Auerbach, MD       Allergies  Allergen Reactions  . Augmentin [Amoxicillin-Pot Clavulanate]   . Other     "CLEANING MATERIALS"   Family History  Problem Relation Age of Onset  . Breast cancer Mother     Age 58  . Cancer Paternal Aunt     Cervical  . Breast cancer Maternal Grandmother     Age 5's  . Diabetes Maternal Grandfather   . Diabetes Paternal Uncle   . Breast cancer Paternal Aunt     Age 105's   PE: BP 118/60 mmHg  Pulse 74  Temp(Src) 97.9 F (36.6 C) (Oral)  Resp 12  Wt 143 lb (64.864 kg)  SpO2 97% Body mass index is 22.58 kg/(m^2).  Wt Readings from Last 3 Encounters:  08/11/15 143 lb (64.864 kg)  05/12/15 143 lb (64.864 kg)  03/27/15 146 lb (66.225 kg)   Constitutional: normal weight, in NAD Eyes: PERRLA, EOMI, no exophthalmos ENT: moist mucous membranes, no thyromegaly, no cervical lymphadenopathy Cardiovascular: RRR, No MRG Respiratory: CTA B Gastrointestinal: abdomen soft, NT, ND, BS+ Musculoskeletal: no deformities, strength intact in all 4 Skin: moist, warm, no rashes Neurological: + mild tremor with outstretched hands, DTR normal in all 4  ASSESSMENT: 1. Hypothyroidism  PLAN:  1. Patient with long-standing uncontrolled hypothyroidism, on Armour thyroid now, with suppressed TSH. We started to decrease the dose of Armour but did not come back for labs. She had a repeat TSH 3 mo ago, which was unsuppressed but I was not aware of this to change her dose... She has insomnia and tremors and had to go to the ED with palpitations and CP since last visit. She does not appear to have a goiter, thyroid nodules, or neck compression symptoms - we discussed about the deleterious effects of taking too much thyroid hormone, as she tells me she is very reticent to decrease the  dose of Armour as she always feels tired when she tries. I strongly advised her that we need to get her thyroid hh's in the normal range as she is at risk for arrhythmia, hypercoagulability, osteoporosis, if she stays overreplaced with thyroid hh's. She agrees >> will decrease Armour to 120 mg daily. - We discussed about correct intake of levothyroxine, fasting, with water, separated by at least 30 minutes from breakfast, and separated by more than 4 hours from calcium, iron, multivitamins, acid reflux medications (PPIs). She is now taking it after coffee + creamer! Advised to move it  earlier >> she will start taking it at night, when she wakes up to go to the restroom.  - will check thyroid tests in 5-6 weeks after the above change: TSH, free T4, free T3 - she agrees to plan - I will see her back in 6 months

## 2015-08-11 NOTE — Patient Instructions (Signed)
Please decrease the dose of Armour dose to 120 mcg daily.  Try to move Armour at night.  Take the thyroid hormone every day, with water, at least 30 minutes before breakfast, separated by at least 4 hours from: - acid reflux medications - calcium - iron - multivitamins  Please come back for labs in 6 weeks.  Please return in 6 months.

## 2015-08-27 ENCOUNTER — Other Ambulatory Visit: Payer: Self-pay | Admitting: Women's Health

## 2016-01-02 ENCOUNTER — Other Ambulatory Visit: Payer: Self-pay

## 2016-01-02 ENCOUNTER — Other Ambulatory Visit: Payer: Self-pay | Admitting: Women's Health

## 2016-01-02 ENCOUNTER — Ambulatory Visit (INDEPENDENT_AMBULATORY_CARE_PROVIDER_SITE_OTHER): Payer: BC Managed Care – PPO | Admitting: Physician Assistant

## 2016-01-02 ENCOUNTER — Ambulatory Visit (INDEPENDENT_AMBULATORY_CARE_PROVIDER_SITE_OTHER): Payer: BC Managed Care – PPO

## 2016-01-02 VITALS — BP 128/80 | HR 76 | Temp 97.9°F | Resp 16 | Ht 66.75 in | Wt 150.8 lb

## 2016-01-02 DIAGNOSIS — R05 Cough: Secondary | ICD-10-CM | POA: Diagnosis not present

## 2016-01-02 DIAGNOSIS — R059 Cough, unspecified: Secondary | ICD-10-CM

## 2016-01-02 DIAGNOSIS — J22 Unspecified acute lower respiratory infection: Secondary | ICD-10-CM

## 2016-01-02 DIAGNOSIS — H6993 Unspecified Eustachian tube disorder, bilateral: Secondary | ICD-10-CM

## 2016-01-02 DIAGNOSIS — R062 Wheezing: Secondary | ICD-10-CM | POA: Diagnosis not present

## 2016-01-02 LAB — POCT CBC
Granulocyte percent: 53.9 %G (ref 37–80)
HCT, POC: 43.4 % (ref 37.7–47.9)
Hemoglobin: 15.5 g/dL (ref 12.2–16.2)
LYMPH, POC: 2.3 (ref 0.6–3.4)
MCH, POC: 36.1 pg — AB (ref 27–31.2)
MCHC: 35.8 g/dL — AB (ref 31.8–35.4)
MCV: 100.8 fL — AB (ref 80–97)
MID (cbc): 0.4 (ref 0–0.9)
MPV: 6.8 fL (ref 0–99.8)
POC GRANULOCYTE: 3.2 (ref 2–6.9)
POC LYMPH %: 38.8 % (ref 10–50)
POC MID %: 7.3 % (ref 0–12)
Platelet Count, POC: 200 10*3/uL (ref 142–424)
RBC: 4.3 M/uL (ref 4.04–5.48)
RDW, POC: 14 %
WBC: 5.9 10*3/uL (ref 4.6–10.2)

## 2016-01-02 MED ORDER — ALBUTEROL SULFATE HFA 108 (90 BASE) MCG/ACT IN AERS: 1.0000 | INHALATION_SPRAY | Freq: Four times a day (QID) | RESPIRATORY_TRACT | 0 refills | Status: DC | PRN

## 2016-01-02 MED ORDER — ALBUTEROL SULFATE (2.5 MG/3ML) 0.083% IN NEBU
2.5000 mg | INHALATION_SOLUTION | Freq: Once | RESPIRATORY_TRACT | Status: AC
  Administered 2016-01-02: 2.5 mg via RESPIRATORY_TRACT

## 2016-01-02 MED ORDER — HYDROCOD POLST-CPM POLST ER 10-8 MG/5ML PO SUER
5.0000 mL | Freq: Two times a day (BID) | ORAL | 0 refills | Status: DC | PRN
Start: 2016-01-02 — End: 2016-10-05

## 2016-01-02 MED ORDER — IPRATROPIUM BROMIDE 0.02 % IN SOLN
0.5000 mg | Freq: Once | RESPIRATORY_TRACT | Status: AC
  Administered 2016-01-02: 0.5 mg via RESPIRATORY_TRACT

## 2016-01-02 MED ORDER — PREDNISONE 10 MG PO TABS: ORAL_TABLET | ORAL | 0 refills | Status: DC

## 2016-01-02 MED ORDER — FLUTICASONE PROPIONATE 50 MCG/ACT NA SUSP: 2.0000 | Freq: Every day | NASAL | 6 refills | Status: DC

## 2016-01-02 MED ORDER — IPRATROPIUM-ALBUTEROL 0.5-2.5 (3) MG/3ML IN SOLN: 3.0000 mL | Freq: Once | RESPIRATORY_TRACT | Status: DC

## 2016-01-02 NOTE — Progress Notes (Signed)
MRN: [id number] DOB: [birth year]  Subjective:   Samantha Golden is a 50 y.o. female presenting for chief complaint of Cough (chest congestion, yellow plegm  x 2-3 wks) . Reports nasal congestion x 1 month and then this progressed to sinus congestion, rhinorrhea, dry cough (no hemoptysis), productive cough (yellow phlegm), wheezing, shortness of breath and chest tightness, subjective fever and fatigue for 2-3 weeks. Has tried mucinex with moderate relief. Denies  ear fullness, ear pain, ear drainage, sore throat, chest pain and myalgia, night sweats, chills, malaise, weight loss, nausea, vomiting, diarrhea, and abdominal pain. 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 She has questionable seasonal allergies. No known history of asthma. Patient has not had flu shot this season. Former smoker, she smoked 0.5 -1 ppd 20 years. Quit smoking 10 years ago. Denies any other aggravating or relieving factors, no other questions or concerns. Has history of pneumonia and bronchitis; notes this happens every Fall.   Samantha Golden has a current medication list which includes the following prescription(s): armour thyroid, estradiol, albuterol, calcium citrate-vitamin d, and multiple vitamins-iron, and the following Facility-Administered Medications: levonorgestrel. Also is allergic to augmentin [amoxicillin-pot clavulanate] and other.  Samantha Golden  has a past medical history of Abnormal cervical Papanicolaou smear; Allergy; BRCA negative (08/09); and Cervical dysplasia. Also  has a past surgical history that includes Adenoidectomy; Colposcopy; Cervix lesion destruction; and Intrauterine device (iud) insertion (09/04/2014).  Objective:   Vitals: BP 128/80 (BP Location: Right Arm, Patient Position: Sitting, Cuff Size: Small)   Pulse 76   Temp 97.9 F (36.6 C) (Oral)   Resp 16   Ht 5' 6.75" (1.695 m)   Wt 150 lb 12.8 oz (68.4 kg)   SpO2 96%   BMI 23.80 kg/m   Physical Exam    Constitutional: She appears well-developed and well-nourished.  HENT:  Head: Normocephalic and atraumatic.  Right Ear: External ear and ear canal normal. Tympanic membrane is scarred and retracted.  Left Ear: External ear and ear canal normal. Tympanic membrane is retracted.  Nose: Mucosal edema present.  Mouth/Throat: Uvula is midline and mucous membranes are normal. Posterior oropharyngeal erythema present.  Eyes: Pupils are equal, round, and reactive to light. Right conjunctiva is injected. Left conjunctiva is injected.  Neck: Normal range of motion.  Cardiovascular: Normal rate, regular rhythm, normal heart sounds and intact distal pulses.   Pulmonary/Chest: Effort normal. She has wheezes ( noted throughout posterior lung fields).  Lymphadenopathy:       Head (right side): No submental, no submandibular, no tonsillar, no preauricular, no posterior auricular and no occipital adenopathy present.       Head (left side): No submental, no submandibular, no tonsillar, no preauricular, no posterior auricular and no occipital adenopathy present.    She has no cervical adenopathy.       Right: No supraclavicular adenopathy present.       Left: No supraclavicular adenopathy present.  Neurological: She is alert.  Skin: Skin is warm and dry.  Psychiatric: She has a normal mood and affect.   Dg Chest 2 View  Result Date: 01/02/2016 CLINICAL DATA:  Cough for 2 weeks. EXAM: CHEST  2 VIEW COMPARISON:  01/25/2014 FINDINGS: The heart size and mediastinal contours are within normal limits. Both lungs are clear. The visualized skeletal structures are unremarkable. IMPRESSION: No active cardiopulmonary disease. Electronically Signed   By: Kathreen Devoid   On: 01/02/2016 16:35    Results for orders placed or  performed in visit on 01/02/16 (from the past 24 hour(s))  POCT CBC     Status: Abnormal   Collection Time: 01/02/16  4:53 PM  Result Value Ref Range   WBC 5.9 4.6 - 10.2 K/uL   Lymph, poc 2.3 0.6  - 3.4   POC LYMPH PERCENT 38.8 10 - 50 %L   MID (cbc) 0.4 0 - 0.9   POC MID % 7.3 0 - 12 %M   POC Granulocyte 3.2 2 - 6.9   Granulocyte percent 53.9 37 - 80 %G   RBC 4.30 4.04 - 5.48 M/uL   Hemoglobin 15.5 12.2 - 16.2 g/dL   HCT, POC 43.4 37.7 - 47.9 %   MCV 100.8 (A) 80 - 97 fL   MCH, POC 36.1 (A) 27 - 31.2 pg   MCHC 35.8 (A) 31.8 - 35.4 g/dL   RDW, POC 14.0 %   Platelet Count, POC 200 142 - 424 K/uL   MPV 6.8 0 - 99.8 fL   Peak flow prior to breathing treatment: 200 Peak flow post breathing treatment: 250 Post breathing treatment pt states she is feeling much better. Wheezing much improved on lung exam.   Assessment and Plan :  1. Cough - DG Chest 2 View; Future - POCT CBC - predniSONE (DELTASONE) 10 MG tablet; 6-5-4-3-2-1. Take all tablets for that day in the am with food.  Dispense: 21 tablet; Refill: 0 - chlorpheniramine-HYDROcodone (TUSSIONEX PENNKINETIC ER) 10-8 MG/5ML SUER; Take 5 mLs by mouth every 12 (twelve) hours as needed for cough.  Dispense: 100 mL; Refill: 0  2. Wheezing - albuterol (PROVENTIL) (2.5 MG/3ML) 0.083% nebulizer solution 2.5 mg; Take 3 mLs (2.5 mg total) by nebulization once. - ipratropium (ATROVENT) nebulizer solution 0.5 mg; Take 2.5 mLs (0.5 mg total) by nebulization once. - albuterol (PROVENTIL HFA;VENTOLIN HFA) 108 (90 Base) MCG/ACT inhaler; Inhale 1-2 puffs into the lungs every 6 (six) hours as needed for wheezing or shortness of breath.  Dispense: 1 Inhaler; Refill: 0 - predniSONE (DELTASONE) 10 MG tablet; 6-5-4-3-2-1. Take all tablets for that day in the am with food.  Dispense: 21 tablet; Refill: 0 - Ambulatory referral to Allergy  3. Disorder of both eustachian tubes -It appears from patient's symptoms, history, and physical exam findings that she may benefit from further evaluation from an allergy and asthma specialist.  - fluticasone (FLONASE) 50 MCG/ACT nasal spray; Place 2 sprays into both nostrils daily.  Dispense: 16 g; Refill: 6 -  Ambulatory referral to Allergy  -Follow up in one week if no improvement, may need to consider antibiotic at this time. Seek care sooner if symptoms worsen. Tenna Delaine, PA-C  [facility] 01/02/2016 4:56 PM

## 2016-01-02 NOTE — Patient Instructions (Addendum)
Follow up if no improvement in seven days.   Use inhaler as needed for wheezing.  Take prednisone as prescribed for coughing. You can use cough syrup at night for cough.  Use flonase daily for nasal congestion and ear retraction.    I have given you a referral to allergy specialist and they will contact you with the appointment.    IF you received an x-ray today, you will receive an invoice from [facility] Radiology. Please contact [facility] at [phone] with questions or concerns regarding your invoice.   IF you received labwork today, you will receive an invoice from [employer]. Please contact [facility] at [phone] with questions or concerns regarding your invoice.   Our billing staff will not be able to assist you with questions regarding bills from these companies.  You will be contacted with the lab results as soon as they are available. The fastest way to get your results is to activate your My Chart account. Instructions are located on the last page of this paperwork. If you have not heard from [place] regarding the results in 2 weeks, please contact this office.      Barotitis Media Barotitis media is inflammation of your middle ear. This occurs when the auditory tube (eustachian tube) leading from the back of your nose (nasopharynx) to your eardrum is blocked. This blockage may result from a cold, environmental allergies, or an upper respiratory infection. Unresolved barotitis media may lead to damage or hearing loss (barotrauma), which may become permanent. HOME CARE INSTRUCTIONS   Use medicines as recommended by your health care provider. Over-the-counter medicines will help unblock the canal and can help during times of air travel.  Do not put anything into your ears to clean or unplug them. Eardrops will not be helpful.  Do not swim, dive, or fly until your health care provider says it is all right to do so. If these activities are  necessary, chewing gum with frequent, forceful swallowing may help. It is also helpful to hold your nose and gently blow to pop your ears for equalizing pressure changes. This forces air into the eustachian tube.  Only take over-the-counter or prescription medicines for pain, discomfort, or fever as directed by your health care provider.  A decongestant may be helpful in decongesting the middle ear and make pressure equalization easier. SEEK MEDICAL CARE IF:  You experience a serious form of dizziness in which you feel as if the room is spinning and you feel nauseated (vertigo).  Your symptoms only involve one ear. SEEK IMMEDIATE MEDICAL CARE IF:   You develop a severe headache, dizziness, or severe ear pain.  You have bloody or pus-like drainage from your ears.  You develop a fever.  Your problems do not improve or become worse. MAKE SURE YOU:   Understand these instructions.  Will watch your condition.  Will get help right away if you are not doing well or get worse.   This information is not intended to replace advice given to you by your health care provider. Make sure you discuss any questions you have with your health care provider.   Document Released: 02/20/2000 Document Revised: 12/13/2012 Document Reviewed: 09/19/2012 Elsevier Interactive Patient Education [employer].

## 2016-01-06 NOTE — Progress Notes (Signed)
Medical screening examination/treatment/procedure(s) were performed by a resident physician or non-physician practitioner and as the supervising physician I was immediately available for consultation/collaboration.  Lynne Leader, MD

## 2016-01-27 ENCOUNTER — Other Ambulatory Visit: Payer: Self-pay | Admitting: Internal Medicine

## 2016-01-28 NOTE — Telephone Encounter (Signed)
Pt is asking for thyroid med to be refilled please

## 2016-02-19 ENCOUNTER — Telehealth: Payer: Self-pay | Admitting: Internal Medicine

## 2016-02-19 NOTE — Telephone Encounter (Signed)
Pt is cancelling her appt with Dr. Cruzita Lederer tomorrow, she is not able to pay the $90 copay at this time. She is wanting to let us know that she is going to go to the lab that she went to last time and get her labs drawn and have them sent to Dr. Cruzita Lederer.

## 2016-02-19 NOTE — Telephone Encounter (Signed)
See message to be advised, Thanks! 

## 2016-02-20 ENCOUNTER — Ambulatory Visit: Payer: BC Managed Care – PPO | Admitting: Internal Medicine

## 2016-04-02 ENCOUNTER — Ambulatory Visit (INDEPENDENT_AMBULATORY_CARE_PROVIDER_SITE_OTHER): Payer: BC Managed Care – PPO | Admitting: Women's Health

## 2016-04-02 ENCOUNTER — Encounter: Payer: Self-pay | Admitting: Women's Health

## 2016-04-02 VITALS — BP 120/80 | Ht 66.0 in | Wt 155.0 lb

## 2016-04-02 DIAGNOSIS — Z01419 Encounter for gynecological examination (general) (routine) without abnormal findings: Secondary | ICD-10-CM

## 2016-04-02 DIAGNOSIS — E038 Other specified hypothyroidism: Secondary | ICD-10-CM | POA: Diagnosis not present

## 2016-04-02 DIAGNOSIS — Z1151 Encounter for screening for human papillomavirus (HPV): Secondary | ICD-10-CM

## 2016-04-02 MED ORDER — ARMOUR THYROID 120 MG PO TABS: ORAL_TABLET | ORAL | 12 refills | Status: DC

## 2016-04-02 NOTE — Patient Instructions (Signed)

## 2016-04-02 NOTE — Addendum Note (Signed)
Addended by: Thurnell Garbe A on: 04/02/2016 09:39 AM   Modules accepted: Orders

## 2016-04-02 NOTE — Progress Notes (Addendum)
Samantha Golden [birth year] [id number]    History:    Presents for annual exam.  08/2014 Mirena IUD with rare bleeding. Hypothyroid on Armour thyroid has been out of the medication for over a week. Had cryo-in her 72s with normal Paps after. Normal mammogram history and is BRCA negative. Mother breast cancer at 42, maternal grandmother in her 55s. Reports drinking 4 glasses of wine daily. Same partner. Had some irregular bleeding last year used Estrace 1 mg good relief of bleeding.  Past medical history, past surgical history, family history and social history were all reviewed and documented in the EPIC chart. First grade teacher. 3 sons 2 sons in college, one in senior in high school. All doing well.  ROS:  A ROS was performed and pertinent positives and negatives are included.  Exam:  Vitals:   04/02/16 0844  BP: 120/80  Weight: 155 lb (70.3 kg)  Height: '5\' 6"'  (1.676 m)   Body mass index is 25.02 kg/m.   General appearance:  Normal Thyroid:  Symmetrical, normal in size, without palpable masses or nodularity. Respiratory  Auscultation:  Clear without wheezing or rhonchi Cardiovascular  Auscultation:  Regular rate, without rubs, murmurs or gallops  Edema/varicosities:  Not grossly evident Abdominal  Soft,nontender, without masses, guarding or rebound.  Liver/spleen:  No organomegaly noted  Hernia:  None appreciated  Skin  Inspection:  Grossly normal   Breasts: Examined lying and sitting.     Right: Without masses, retractions, discharge or axillary adenopathy.     Left: Without masses, retractions, discharge or axillary adenopathy. Gentitourinary   Inguinal/mons:  Normal without inguinal adenopathy  External genitalia:  Normal  BUS/Urethra/Skene's glands:  Normal  Vagina:  Normal  Cervix:  Normal IUD strings visible  Uterus:  normal in size, shape and contour.  Midline and mobile  Adnexa/parametria:     Rt: Without masses or tenderness.   Lt: Without masses or  tenderness.  Anus and perineum: Normal  Digital rectal exam: Normal sphincter tone without palpated masses or tenderness  Assessment/Plan:  51 y.o. D WF G3 P3 for annual exam with complaint of weight gain.  08/2014 Mirena IUD-rare bleeding Hypothyroid Cryo-> years ago normal Paps after BRCA negative. Probable alcohol abuse  Plan: Has been out of  armour thyroid for 1 week, Armour thyroid 120 mg prescription, proper use given and reviewed will return next week for labs TSH, lipid panel, CMP, CBC. UA, Pap with HR HPV typing, new screening guidelines reviewed. SBE's, overdue for mammogram instructed to schedule. Continue active lifestyle of regular exercise, vitamin D 1000 daily encouraged, reviewed importance of decreasing amount of alcohol reviewed that amount is harmful to health, many calories, AA reviewed, denies need for counseling.  Huel Cote [facility], 9:17 AM 04/02/2016

## 2016-04-06 LAB — PAP, TP IMAGING W/ HPV RNA, RFLX HPV TYPE 16,18/45: HPV MRNA, HIGH RISK: NOT DETECTED

## 2016-04-08 ENCOUNTER — Other Ambulatory Visit: Payer: Self-pay | Admitting: Anesthesiology

## 2016-04-08 ENCOUNTER — Other Ambulatory Visit: Payer: BC Managed Care – PPO

## 2016-04-08 DIAGNOSIS — E039 Hypothyroidism, unspecified: Secondary | ICD-10-CM

## 2016-04-08 DIAGNOSIS — E038 Other specified hypothyroidism: Secondary | ICD-10-CM

## 2016-04-08 DIAGNOSIS — Z01419 Encounter for gynecological examination (general) (routine) without abnormal findings: Secondary | ICD-10-CM

## 2016-04-08 LAB — COMPREHENSIVE METABOLIC PANEL
ALK PHOS: 49 U/L (ref 33–115)
ALT: 19 U/L (ref 6–29)
AST: 23 U/L (ref 10–35)
Albumin: 4 g/dL (ref 3.6–5.1)
BILIRUBIN TOTAL: 0.8 mg/dL (ref 0.2–1.2)
BUN: 12 mg/dL (ref 7–25)
CALCIUM: 9.3 mg/dL (ref 8.6–10.2)
CO2: 26 mmol/L (ref 20–31)
CREATININE: 0.65 mg/dL (ref 0.50–1.10)
Chloride: 106 mmol/L (ref 98–110)
GLUCOSE: 104 mg/dL — AB (ref 65–99)
Potassium: 4.1 mmol/L (ref 3.5–5.3)
Sodium: 138 mmol/L (ref 135–146)
Total Protein: 6.9 g/dL (ref 6.1–8.1)

## 2016-04-08 LAB — CBC WITH DIFFERENTIAL/PLATELET
BASOS PCT: 0 %
Basophils Absolute: 0 cells/uL (ref 0–200)
EOS PCT: 3 %
Eosinophils Absolute: 168 cells/uL (ref 15–500)
HEMATOCRIT: 44.5 % (ref 35.0–45.0)
HEMOGLOBIN: 15.3 g/dL (ref 11.7–15.5)
LYMPHS ABS: 1344 {cells}/uL (ref 850–3900)
LYMPHS PCT: 24 %
MCH: 35.7 pg — ABNORMAL HIGH (ref 27.0–33.0)
MCHC: 34.4 g/dL (ref 32.0–36.0)
MCV: 103.7 fL — ABNORMAL HIGH (ref 80.0–100.0)
MONO ABS: 560 {cells}/uL (ref 200–950)
MPV: 8.9 fL (ref 7.5–12.5)
Monocytes Relative: 10 %
NEUTROS PCT: 63 %
Neutro Abs: 3528 cells/uL (ref 1500–7800)
Platelets: 253 10*3/uL (ref 140–400)
RBC: 4.29 MIL/uL (ref 3.80–5.10)
RDW: 13.4 % (ref 11.0–15.0)
WBC: 5.6 10*3/uL (ref 3.8–10.8)

## 2016-04-08 LAB — LIPID PANEL
Cholesterol: 194 mg/dL (ref <200)
HDL: 65 mg/dL (ref >50)
LDL CALC: 112 mg/dL — AB (ref <100)
Total CHOL/HDL Ratio: 3 Ratio (ref <5.0)
Triglycerides: 87 mg/dL (ref <150)
VLDL: 17 mg/dL (ref <30)

## 2016-04-08 LAB — TSH: TSH: 0.61 m[IU]/L

## 2016-04-08 LAB — T3: T3 TOTAL: 269 ng/dL — AB (ref 76–181)

## 2016-04-08 LAB — T4: T4 TOTAL: 6.3 ug/dL (ref 4.5–12.0)

## 2016-04-13 ENCOUNTER — Telehealth: Payer: Self-pay

## 2016-04-13 NOTE — Telephone Encounter (Signed)
Telephone call, questions answered, Pap was ASCUS with negative HR HPV typing.

## 2016-04-13 NOTE — Telephone Encounter (Signed)
Patient saw her 04/04/16 pap smear result on My Chart and is concerned about it being abnormal.

## 2016-05-01 IMAGING — CR DG CHEST 2V
2 series · 2 of 2 positions shown · non-contrast
Comparison: None.

CLINICAL DATA: Cough

EXAM:
CHEST  2 VIEW

[PA]
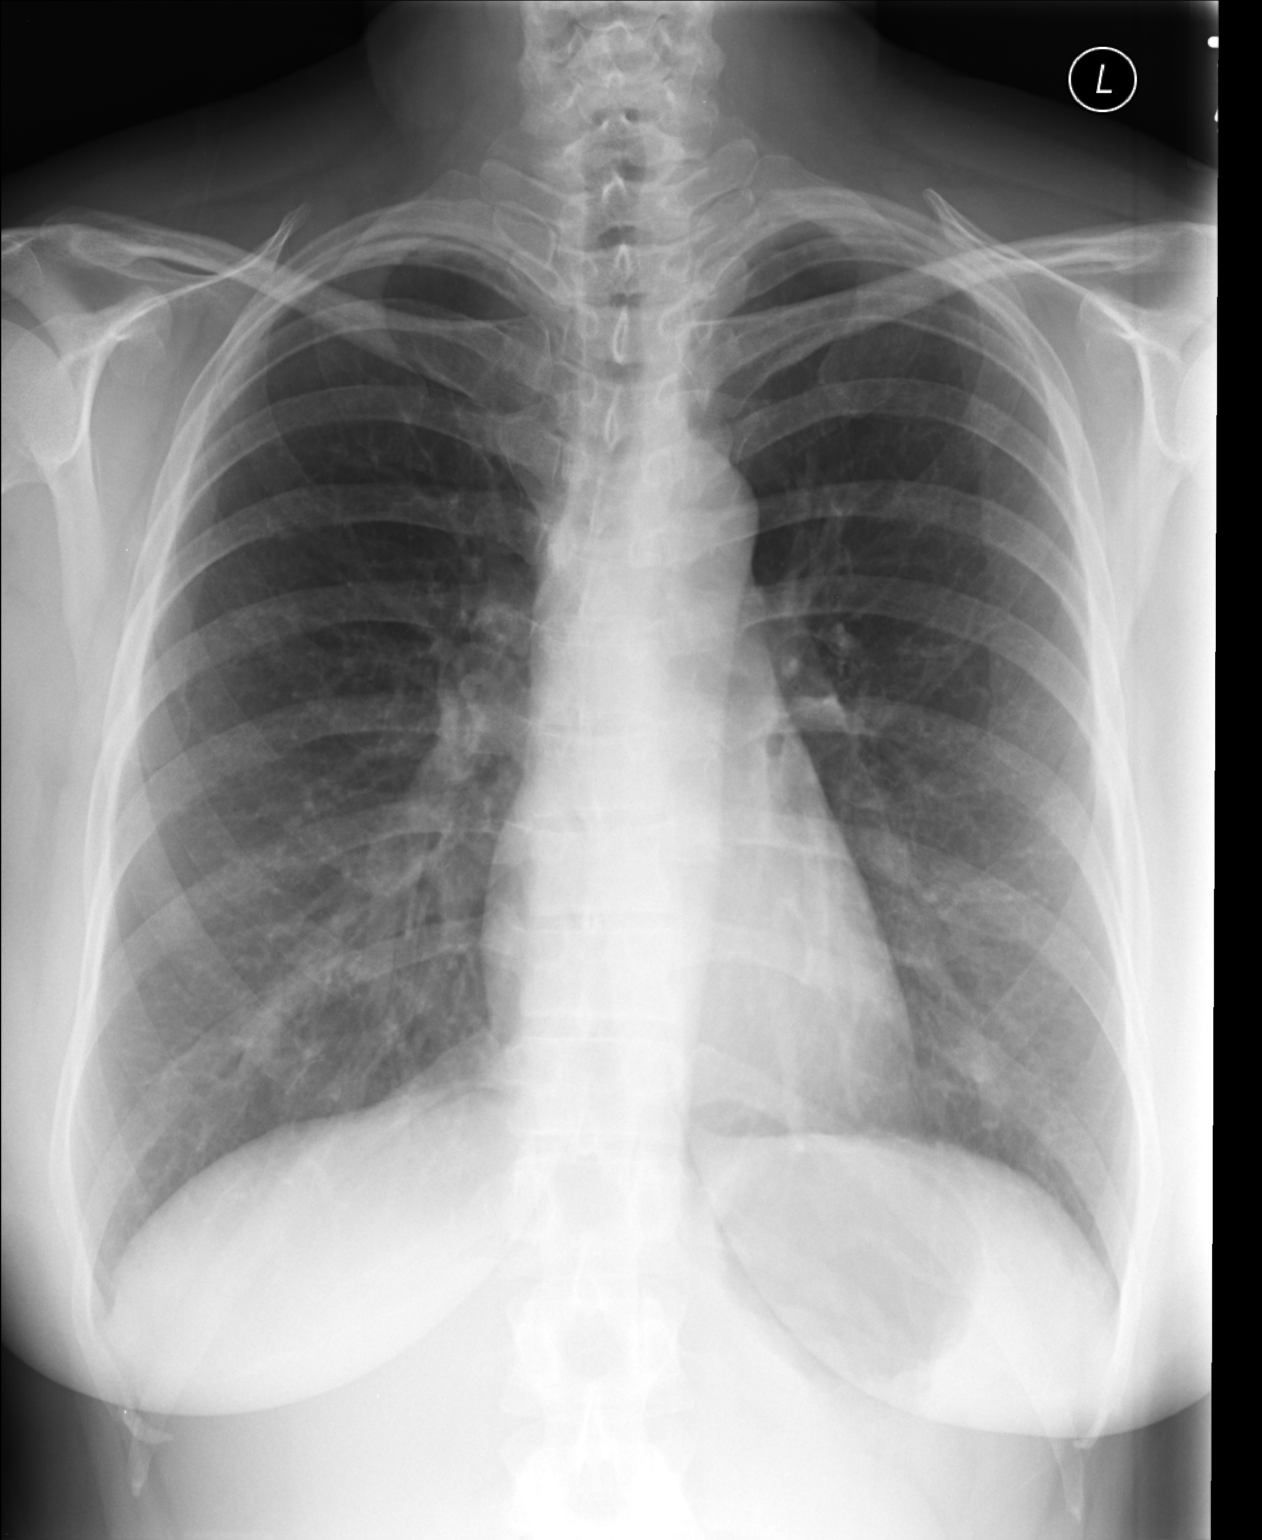

[lateral]
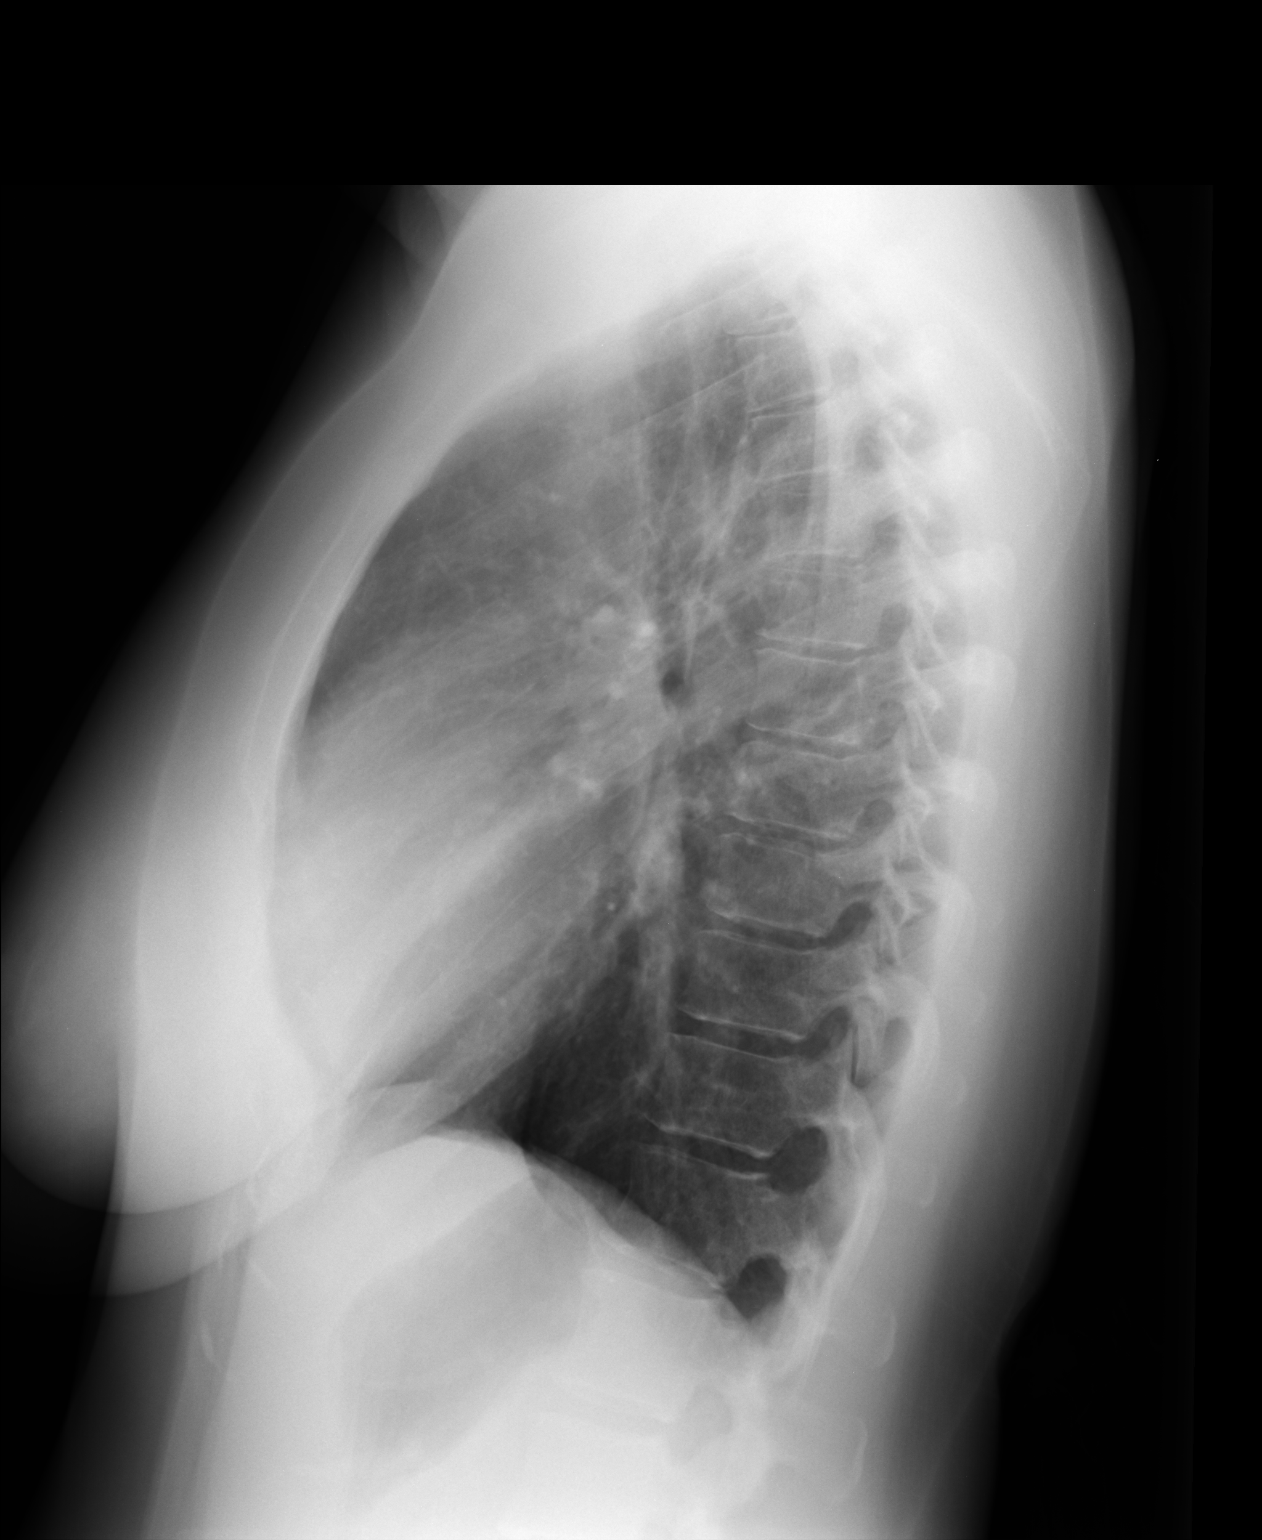

[2 of 2 positions shown; findings below may reference images not displayed]

FINDINGS: Normal cardiac silhouette and mediastinal contours. The lungs appear
mildly hyperexpanded. There is mild diffuse slightly nodular
thickening of the pulmonary interstitium. No focal airspace
opacities. No pleural effusion or pneumothorax. No evidence of
edema. No acute osseus abnormalities.
IMPRESSION: Findings suggestive of airways disease / bronchitis. No focal
airspace opacities to suggest pneumonia.

## 2016-09-15 ENCOUNTER — Ambulatory Visit (INDEPENDENT_AMBULATORY_CARE_PROVIDER_SITE_OTHER): Payer: BC Managed Care – PPO | Admitting: Physician Assistant

## 2016-09-15 ENCOUNTER — Encounter: Payer: Self-pay | Admitting: Physician Assistant

## 2016-09-15 VITALS — BP 129/80 | HR 74 | Temp 98.0°F | Resp 16 | Ht 66.0 in | Wt 153.8 lb

## 2016-09-15 DIAGNOSIS — G8929 Other chronic pain: Secondary | ICD-10-CM

## 2016-09-15 DIAGNOSIS — F1010 Alcohol abuse, uncomplicated: Secondary | ICD-10-CM | POA: Diagnosis not present

## 2016-09-15 DIAGNOSIS — R109 Unspecified abdominal pain: Secondary | ICD-10-CM | POA: Diagnosis not present

## 2016-09-15 LAB — POCT URINALYSIS DIP (MANUAL ENTRY)
Bilirubin, UA: NEGATIVE
GLUCOSE UA: NEGATIVE mg/dL
LEUKOCYTES UA: NEGATIVE
NITRITE UA: NEGATIVE
PROTEIN UA: NEGATIVE mg/dL
UROBILINOGEN UA: 0.2 U/dL
pH, UA: 6 (ref 5.0–8.0)

## 2016-09-15 NOTE — Progress Notes (Signed)
JAIDA Golden  MRN: [id number] DOB: 12/22/1965  Subjective:   Samantha Golden is a 51 y.o. female who presents for evaluation of abdominal pain. Onset was 2 years ago. Symptoms have been gradually worsening. The pain is described as cramping. Pain is located in the mid epigastric  without radiation.  Aggravating factors: eating and gluten.  Alleviating factors: not eating. Associated symptoms: diarrhea. The patient denies anorexia, arthralagias, belching, heartburn, chills, constipation, dysuria, fever, flatus, headache, hematochezia, hematuria, melena, myalgias, nausea, sweats and vomiting.States she is drinking way too much alcohol. Drinks 4-5 cocktails a day. On the weekends, she will have at least 5-7 shots per day. She still functions throughout the day but frequently thinks about getting home and having a drink when she is at work. She wants to quit drinking alcohol as she knows it is bad for her health. In terms of diet, she eats more of a ketogenic diet. High fat and protein (chicken and veggies), little carbs. Will eat dairy products occasionally.  No past abdominal surgeries. No FH of UC, IBS, or crohns. Has not tried anything for relief. Has IUD placed.  Review of Systems  Per HPI  Patient Active Problem List   Diagnosis Date Noted  . 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 Hypothyroidism due to Hashimoto's thyroiditis     Current Outpatient Prescriptions on File Prior to Visit  Medication Sig Dispense Refill  . ARMOUR THYROID 120 MG tablet TAKE 1 TABLET BEFORE BREAKFAST. 45 tablet 12  . estradiol (ESTRACE) 1 MG tablet TAKE 1 TABLET ONCE DAILY. 30 tablet 6  . albuterol (PROVENTIL HFA;VENTOLIN HFA) 108 (90 Base) MCG/ACT inhaler Inhale 1-2 puffs into the lungs every 6 (six) hours as needed for wheezing or shortness of breath. (Patient not taking: Reported on 04/02/2016) 1 Inhaler 0  . Calcium Carbonate-Vitamin D (CALCIUM + D PO) Take by mouth.  Reported on 08/11/2015    . chlorpheniramine-HYDROcodone (TUSSIONEX PENNKINETIC ER) 10-8 MG/5ML SUER Take 5 mLs by mouth every 12 (twelve) hours as needed for cough. (Patient not taking: Reported on 04/02/2016) 100 mL 0  . fluticasone (FLONASE) 50 MCG/ACT nasal spray Place 2 sprays into both nostrils daily. (Patient not taking: Reported on 04/02/2016) 16 g 6  . Multiple Vitamins-Iron (MULTIVITAMIN/IRON PO) Take by mouth. Reported on 08/11/2015    . predniSONE (DELTASONE) 10 MG tablet 6-5-4-3-2-1. Take all tablets for that day in the am with food. (Patient not taking: Reported on 04/02/2016) 21 tablet 0   Current Facility-Administered Medications on File Prior to Visit  Medication Dose Route Frequency Provider Last Rate Last Dose  . levonorgestrel (MIRENA) 20 MCG/24HR IUD   Intrauterine Once Fontaine, Belinda Block, MD        Allergies  Allergen Reactions  . Augmentin [Amoxicillin-Pot Clavulanate]   . Other     "CLEANING MATERIALS"      Social History   Social History  . Marital status: Divorced    Spouse name: N/A  . Number of children: N/A  . Years of education: N/A   Occupational History  . Not on file.   Social History Main Topics  . Smoking status: Former Smoker    Years: 10.00    Types: Cigarettes  . Smokeless tobacco: Never Used  . Alcohol use 1.0 oz/week    2 Standard drinks or equivalent per week  . Drug use: No  . Sexual activity: Yes    Birth control/ protection: IUD     Comment: MIRENA  09/04/2014   Other Topics Concern  . Not on file   Social History Narrative  . No narrative on file    Objective:  BP 129/80   Pulse 74   Temp 98 F (36.7 C) (Oral)   Resp 16   Ht '5\' 6"'  (1.676 m)   Wt 153 lb 12.8 oz (69.8 kg)   SpO2 96%   BMI 24.82 kg/m   Physical Exam  Constitutional: She is oriented to person, place, and time and well-developed, well-nourished, and in no distress.  HENT:  Head: Normocephalic and atraumatic.  Eyes: Conjunctivae are normal.  Neck: Normal  range of motion.  Cardiovascular: Normal rate, regular rhythm and normal heart sounds.   Pulmonary/Chest: Effort normal.  Abdominal: Soft. Bowel sounds are normal. She exhibits no distension. There is generalized tenderness (mild). There is no rigidity, no guarding, no CVA tenderness, no tenderness at McBurney's point and negative Murphy's sign.  Neurological: She is alert and oriented to person, place, and time. Gait normal.  Skin: Skin is warm and dry.  Psychiatric: Affect normal.  Vitals reviewed.   Assessment and Plan :   1. Chronic abdominal pain Labs pending. DDx includes gluten intolerance, lactose intolerance, excessive alcohol consumption, IBS, and inflammatory bowel disease. Discussed eliminating gluten, dairy, and alcohol from diet while awaiting labs. Depending on lab results, consider referral to GI.  - CMP14+EGFR - CBC with Differential/Platelet - TSH - C-reactive protein - Lipase - Amylase - POCT urinalysis dipstick  2. Alcohol abuse Given educational material on alcohol abuse. Placed referral to psych.  - Ambulatory referral to Psychiatry   Tenna Delaine PA-C  [facility] 09/15/2016 9:23 AM

## 2016-09-15 NOTE — Patient Instructions (Addendum)
We should have your lab results back within 4-5 days. We will contact you within a week to discuss these results. Over the next couple of weeks, try eliminating dairy (lactose), gluten, and decreasing alcohol in your diet to see if this helps with your abdominal discomfort. I have placed a referral to psychiatry and they will contact you to schedule an appointment to discuss further treatment of alcohol use. Thank you for letting me participate in your health and well being.   Abdominal Pain, Adult Many things can cause belly (abdominal) pain. Most times, belly pain is not dangerous. Many cases of belly pain can be watched and treated at home. Sometimes belly pain is serious, though. Your doctor will try to find the cause of your belly pain. Follow these instructions at home:  Take over-the-counter and prescription medicines only as told by your doctor. Do not take medicines that help you poop (laxatives) unless told to by your doctor.  Drink enough fluid to keep your pee (urine) clear or pale yellow.  Watch your belly pain for any changes.  Keep all follow-up visits as told by your doctor. This is important. Contact a doctor if:  Your belly pain changes or gets worse.  You are not hungry, or you lose weight without trying.  You are having trouble pooping (constipated) or have watery poop (diarrhea) for more than 2-3 days.  You have pain when you pee or poop.  Your belly pain wakes you up at night.  Your pain gets worse with meals, after eating, or with certain foods.  You are throwing up and cannot keep anything down.  You have a fever. Get help right away if:  Your pain does not go away as soon as your doctor says it should.  You cannot stop throwing up.  Your pain is only in areas of your belly, such as the right side or the left lower part of the belly.  You have bloody or black poop, or poop that looks like tar.  You have very bad pain, cramping, or bloating in your  belly.  You have signs of not having enough fluid or water in your body (dehydration), such as: ? Dark pee, very little pee, or no pee. ? Cracked lips. ? Dry mouth. ? Sunken eyes. ? Sleepiness. ? Weakness. This information is not intended to replace advice given to you by your health care provider. Make sure you discuss any questions you have with your health care provider. Document Released: 08/11/2007 Document Revised: 09/12/2015 Document Reviewed: 08/06/2015 Elsevier Interactive Patient Education  2017 [employer].  Alcohol Abuse and Nutrition Alcohol abuse is any pattern of alcohol consumption that harms your health, relationships, or work. Alcohol abuse can affect how your body breaks down and absorbs nutrients from food by causing your liver to work abnormally. Additionally, many people who abuse alcohol do not eat enough carbohydrates, protein, fat, vitamins, and minerals. This can cause poor nutrition (malnutrition) and a lack of nutrients (nutrient deficiencies), which can lead to further complications. Nutrients that are commonly lacking (deficient) among people who abuse alcohol include:  Vitamins. ? Vitamin A. This is stored in your liver. It is important for your vision, metabolism, and ability to fight off infections (immunity). ? B vitamins. These include vitamins such as folate, thiamin, and niacin. These are important in new cell growth and maintenance. ? Vitamin C. This plays an important role in iron absorption, wound healing, and immunity. ? Vitamin D. This is produced by your  liver, but you can also get vitamin D from food. Vitamin D is necessary for your body to absorb and use calcium.  Minerals. ? Calcium. This is important for your bones and your heart and blood vessel (cardiovascular) function. ? Iron. This is important for blood, muscle, and nervous system functioning. ? Magnesium. This plays an important role in muscle and nerve function, and it helps to control  blood sugar and blood pressure. ? Zinc. This is important for the normal function of your nervous system and digestive system (gastrointestinal tract).  Nutrition is an essential component of therapy for alcohol abuse. Your health care provider or dietitian will work with you to design a plan that can help restore nutrients to your body and prevent potential complications. What is my plan? Your dietitian may develop a specific diet plan that is based on your condition and any other complications you may have. A diet plan will commonly include:  A balanced diet. ? Grains: 6-8 oz per day. ? Vegetables: 2-3 cups per day. ? Fruits: 1-2 cups per day. ? Meat and other protein: 5-6 oz per day. ? Dairy: 2-3 cups per day.  Vitamin and mineral supplements.  What do I need to know about alcohol and nutrition?  Consume foods that are high in antioxidants, such as grapes, berries, nuts, green tea, and dark green and orange vegetables. This can help to counteract some of the stress that is placed on your liver by consuming alcohol.  Avoid food and drinks that are high in fat and sugar. Foods such as sugared soft drinks, salty snack foods, and candy contain empty calories. This means that they lack important nutrients such as protein, fiber, and vitamins.  Eat frequent meals and snacks. Try to eat 5-6 small meals each day.  Eat a variety of fresh fruits and vegetables each day. This will help you get plenty of water, fiber, and vitamins in your diet.  Drink plenty of water and other clear fluids. Try to drink at least 48-64 oz (1.5-2 L) of water per day.  If you are a vegetarian, eat a variety of protein-rich foods. Pair whole grains with plant-based proteins at meals and snacks to obtain the greatest nutrient benefit from your food. For example, eat rice with beans, put peanut butter on whole-grain toast, or eat oatmeal with sunflower seeds.  Soak beans and whole grains overnight before cooking. This  can help your body to absorb the nutrients more easily.  Include foods fortified with vitamins and minerals in your diet. Commonly fortified foods include milk, orange juice, cereal, and bread.  If you are malnourished, your dietitian may recommend a high-protein, high-calorie diet. This may include: ? 2,000-3,000 calories (kilocalories) per day. ? 70-100 grams of protein per day.  Your health care provider may recommend a complete nutritional supplement beverage. This can help to restore calories, protein, and vitamins to your body. Depending on your condition, you may be advised to consume this instead of or in addition to meals.  Limit your intake of caffeine. Replace drinks like coffee and black tea with decaffeinated coffee and herbal tea.  Eat a variety of foods that are high in omega fatty acids. These include fish, nuts and seeds, and soybeans. These foods may help your liver to recover and may also stabilize your mood.  Certain medicines may cause changes in your appetite, taste, and weight. Work with your health care provider and dietitian to make any adjustments to your medicines and diet plan.  Include other healthy lifestyle choices in your daily routine. ? Be physically active. ? Get enough sleep. ? Spend time doing activities that you enjoy.  If you are unable to take in enough food and calories by mouth, your health care provider may recommend a feeding tube. This is a tube that passes through your nose and throat, directly into your stomach. Nutritional supplement beverages can be given to you through the feeding tube to help you get the nutrients you need.  Take vitamin or mineral supplements as recommended by your health care provider. What foods can I eat? Grains Enriched pasta. Enriched rice. Fortified whole-grain bread. Fortified whole-grain cereal. Barley. Brown rice. Quinoa. [place]. Vegetables All fresh, frozen, and canned vegetables. Spinach. Kale. Artichoke.  Carrots. Winter squash and pumpkin. Sweet potatoes. Broccoli. Cabbage. Cucumbers. Tomatoes. Sweet peppers. Green beans. Peas. Corn. Fruits All fresh and frozen fruits. Berries. Grapes. Mango. Papaya. Guava. Cherries. Apples. Bananas. Peaches. Plums. Pineapple. Watermelon. Cantaloupe. Oranges. Avocado. Meats and Other Protein Sources Beef liver. Lean beef. Pork. Fresh and canned chicken. Fresh fish. Oysters. Sardines. Canned tuna. Shrimp. Eggs with yolks. Nuts and seeds. Peanut butter. Beans and lentils. Soybeans. Tofu. Dairy Whole, low-fat, and nonfat milk. Whole, low-fat, and nonfat yogurt. Cottage cheese. Sour cream. Hard and soft cheeses. Beverages Water. Herbal tea. Decaffeinated coffee. Decaffeinated green tea. 100% fruit juice. 100% vegetable juice. Instant breakfast shakes. Condiments Ketchup. Mayonnaise. Mustard. Salad dressing. Barbecue sauce. Sweets and Desserts Sugar-free ice cream. Sugar-free pudding. Sugar-free gelatin. Fats and Oils Butter. Vegetable oil, flaxseed oil, olive oil, and walnut oil. Other Complete nutrition shakes. Protein bars. Sugar-free gum. The items listed above may not be a complete list of recommended foods or beverages. Contact your dietitian for more options. What foods are not recommended? Grains Sugar-sweetened breakfast cereals. Flavored instant oatmeal. Fried breads. Vegetables Breaded or deep-fried vegetables. Fruits Dried fruit with added sugar. Candied fruit. Canned fruit in syrup. Meats and Other Protein Sources Breaded or deep-fried meats. Dairy Flavored milks. Fried cheese curds or fried cheese sticks. Beverages Alcohol. Sugar-sweetened soft drinks. Sugar-sweetened tea. Caffeinated coffee and tea. Condiments Sugar. Honey. Agave nectar. Molasses. Sweets and Desserts Chocolate. Cake. Cookies. Candy. Other Potato chips. Pretzels. Salted nuts. Candied nuts. The items listed above may not be a complete list of foods and beverages to avoid.  Contact your dietitian for more information. This information is not intended to replace advice given to you by your health care provider. Make sure you discuss any questions you have with your health care provider. Document Released: 12/17/2004 Document Revised: 07/02/2015 Document Reviewed: 09/25/2013 Elsevier Interactive Patient Education  2018 [employer].     IF you received an x-ray today, you will receive an invoice from [facility]. Please contact [facility]iology at [phone] with questions or concerns regarding your invoice.   IF you received labwork today, you will receive an invoice from [place]. Please contact [facility] at [phone] with questions or concerns regarding your invoice.   Our billing staff will not be able to assist you with questions regarding bills from these companies.  You will be contacted with the lab results as soon as they are available. The fastest way to get your results is to activate your My Chart account. Instructions are located on the last page of this paperwork. If you have not heard from [place] regarding the results in 2 weeks, please contact this office.

## 2016-09-16 LAB — CMP14+EGFR
A/G RATIO: 2 (ref 1.2–2.2)
ALBUMIN: 4.5 g/dL (ref 3.5–5.5)
ALT: 54 IU/L — AB (ref 0–32)
AST: 58 IU/L — ABNORMAL HIGH (ref 0–40)
Alkaline Phosphatase: 54 IU/L (ref 39–117)
BILIRUBIN TOTAL: 0.6 mg/dL (ref 0.0–1.2)
BUN / CREAT RATIO: 20 (ref 9–23)
BUN: 12 mg/dL (ref 6–24)
CALCIUM: 9.8 mg/dL (ref 8.7–10.2)
CHLORIDE: 102 mmol/L (ref 96–106)
CO2: 20 mmol/L (ref 20–29)
Creatinine, Ser: 0.61 mg/dL (ref 0.57–1.00)
GFR calc non Af Amer: 108 mL/min/{1.73_m2} (ref >59)
GFR, EST AFRICAN AMERICAN: 124 mL/min/{1.73_m2} (ref >59)
GLOBULIN, TOTAL: 2.3 g/dL (ref 1.5–4.5)
Glucose: 79 mg/dL (ref 65–99)
POTASSIUM: 4.3 mmol/L (ref 3.5–5.2)
Sodium: 139 mmol/L (ref 134–144)
TOTAL PROTEIN: 6.8 g/dL (ref 6.0–8.5)

## 2016-09-16 LAB — CBC WITH DIFFERENTIAL/PLATELET
BASOS ABS: 0 10*3/uL (ref 0.0–0.2)
BASOS: 0 %
EOS (ABSOLUTE): 0.1 10*3/uL (ref 0.0–0.4)
Eos: 3 %
HEMOGLOBIN: 15.3 g/dL (ref 11.1–15.9)
Hematocrit: 44.8 % (ref 34.0–46.6)
IMMATURE GRANS (ABS): 0 10*3/uL (ref 0.0–0.1)
Immature Granulocytes: 0 %
LYMPHS ABS: 1.3 10*3/uL (ref 0.7–3.1)
LYMPHS: 28 %
MCH: 33.8 pg — AB (ref 26.6–33.0)
MCHC: 34.2 g/dL (ref 31.5–35.7)
MCV: 99 fL — ABNORMAL HIGH (ref 79–97)
Monocytes Absolute: 0.5 10*3/uL (ref 0.1–0.9)
Monocytes: 11 %
NEUTROS ABS: 2.6 10*3/uL (ref 1.4–7.0)
Neutrophils: 58 %
PLATELETS: 219 10*3/uL (ref 150–379)
RBC: 4.53 x10E6/uL (ref 3.77–5.28)
RDW: 12.6 % (ref 12.3–15.4)
WBC: 4.6 10*3/uL (ref 3.4–10.8)

## 2016-09-16 LAB — AMYLASE: AMYLASE: 61 U/L (ref 31–124)

## 2016-09-16 LAB — LIPASE: Lipase: 25 U/L (ref 14–72)

## 2016-09-16 LAB — TSH: TSH: 0.174 u[IU]/mL — ABNORMAL LOW (ref 0.450–4.500)

## 2016-09-16 LAB — C-REACTIVE PROTEIN: CRP: 1.7 mg/L (ref 0.0–4.9)

## 2016-09-25 ENCOUNTER — Telehealth: Payer: Self-pay | Admitting: Physician Assistant

## 2016-09-25 NOTE — Telephone Encounter (Signed)
Called, no answer. Sent Samantha Golden.

## 2016-09-26 ENCOUNTER — Encounter: Payer: Self-pay | Admitting: Gynecology

## 2016-09-26 ENCOUNTER — Encounter: Payer: Self-pay | Admitting: Physician Assistant

## 2016-09-28 LAB — SPECIMEN STATUS REPORT

## 2016-09-28 LAB — T3, FREE: T3 FREE: 6.5 pg/mL — AB (ref 2.0–4.4)

## 2016-09-28 LAB — T4, FREE: Free T4: 1.04 ng/dL (ref 0.82–1.77)

## 2016-10-05 ENCOUNTER — Ambulatory Visit (INDEPENDENT_AMBULATORY_CARE_PROVIDER_SITE_OTHER): Payer: BC Managed Care – PPO | Admitting: Internal Medicine

## 2016-10-05 ENCOUNTER — Encounter: Payer: Self-pay | Admitting: Internal Medicine

## 2016-10-05 VITALS — BP 110/62 | HR 65 | Wt 156.0 lb

## 2016-10-05 DIAGNOSIS — E063 Autoimmune thyroiditis: Secondary | ICD-10-CM

## 2016-10-05 DIAGNOSIS — E038 Other specified hypothyroidism: Secondary | ICD-10-CM | POA: Diagnosis not present

## 2016-10-05 MED ORDER — ARMOUR THYROID 90 MG PO TABS: 90.0000 mg | ORAL_TABLET | ORAL | 1 refills | Status: DC

## 2016-10-05 MED ORDER — ARMOUR THYROID 120 MG PO TABS: ORAL_TABLET | ORAL | 12 refills | Status: DC

## 2016-10-05 NOTE — Patient Instructions (Addendum)
Please start alternating Armour 120 mg with 90 mg every other day.  Take the thyroid hormone every day, with water, at least 30 minutes before breakfast, separated by at least 4 hours from: - acid reflux medications - calcium - iron - multivitamins  Please come back for a follow-up appointment in 6 months.

## 2016-10-05 NOTE — Progress Notes (Signed)
Patient ID: Samantha Golden, female   DOB: Nov 17, 1965, 51 y.o.   MRN: [id number]   HPI  Samantha Golden is a 51 y.o.-year-old female, initially referred by Dr Phineas Real, for management of uncontrolled Hashimoto's hypothyroidism. Last visit 1 year ago.  Reviewed hx: Pt. has been dx with hypothyroidism in ~1990.   Previously on Synthroid DAW 225 mcg >> 300 mcg >> 250 mcg >> Armour 150 mg >> 135 mg >> 120 mg.  Pt is on Armour 120 mg daily, taken: - in am - fasting - at least 30 min from b'fast - no Ca, Fe, MVI, PPIs - not on Biotin  Of note, last TSH was low, so she was advised by her PCP to decrease the dose of Armour to half. She did this for 3 days but then went back to the 120 mg until our visit today.  I reviewed pt's thyroid tests: Lab Results  Component Value Date   TSH 0.174 (L) 09/15/2016   TSH 0.61 04/08/2016   TSH 0.05 (L) 05/12/2015   TSH 0.08 (L) 03/26/2015   TSH 4.84 (H) 09/03/2014   TSH 0.18 (L) 05/13/2014   TSH 58.73 (H) 04/08/2014   TSH 0.08 (L) 12/06/2013   TSH 130.618 (H) 10/23/2013   TSH 90.174 (H) 03/10/2012   FREET4 1.04 09/15/2016   FREET4 1.08 03/26/2015   FREET4 0.39 (L) 09/03/2014   FREET4 0.64 05/13/2014   FREET4 0.84 04/08/2014   FREET4 1.75 (H) 12/06/2013  TSH 4.26 in 2012.  She has been out of the LT4 for ~ 1 mo before the test in 10/2013.  Component     Latest Ref Rng 10/23/2013  Thyroid Peroxidase Antibody     <35.0 IU/mL 265.0 (H)  Thyroglobulin Ab     <40.0 IU/mL 38.2   She got palpitations when she took Adderall with the LT4 300 mcg.  Pt denies: - feeling nodules in neck - hoarseness - dysphagia - choking - SOB with lying down  ROS: Constitutional: + Fatigue, + weight gain, + hot flashes Eyes: + blurry vision, no xerophthalmia ENT: no sore throat, + see HPI Cardiovascular: + CP/no SOB/no palpitations/no leg swelling Respiratory: no cough/no SOB/no wheezing Gastrointestinal: no N/no V/+ D/no C/no acid  reflux Musculoskeletal:+muscle aches/no joint aches Skin: no rashes, +hair loss Neurological: + tremors/no numbness/no tingling/no dizziness  I reviewed pt's medications, allergies, PMH, social hx, family hx, and changes were documented in the history of present illness. Otherwise, unchanged from my initial visit note.  Past Medical History:  Diagnosis Date  . Abnormal cervical Papanicolaou smear    ? LASER TREATMENT   . Allergy   . BRCA negative 08/09  . Cervical dysplasia    Past Surgical History:  Procedure Laterality Date  . ADENOIDECTOMY    . CERVIX LESION DESTRUCTION    . COLPOSCOPY    . INTRAUTERINE DEVICE (IUD) INSERTION  09/04/2014   Mirena   History   Social History  . Marital Status: Divorced    Spouse Name: N/A    Number of Children: 3   Occupational History  . teacher   Social History Main Topics  . Smoking status: Former Research scientist (life sciences)  . Smokeless tobacco: Never Used  . Alcohol Use: 1.0 oz/week    2 drink(s) per week  . Drug Use: No  . Sexual Activity: Yes    Birth Control/ Protection: IUD     Comment: ParaGard 08/2012   Current Outpatient Prescriptions on File Prior to Visit  Medication Sig Dispense  Refill  . ARMOUR THYROID 120 MG tablet TAKE 1 TABLET BEFORE BREAKFAST. 45 tablet 12  . Calcium Carbonate-Vitamin D (CALCIUM + D PO) Take by mouth. Reported on 08/11/2015    . estradiol (ESTRACE) 1 MG tablet TAKE 1 TABLET ONCE DAILY. (Patient not taking: Reported on 10/05/2016) 30 tablet 6  . fluticasone (FLONASE) 50 MCG/ACT nasal spray Place 2 sprays into both nostrils daily. (Patient not taking: Reported on 04/02/2016) 16 g 6   Current Facility-Administered Medications on File Prior to Visit  Medication Dose Route Frequency Provider Last Rate Last Dose  . levonorgestrel (MIRENA) 20 MCG/24HR IUD   Intrauterine Once Fontaine, Belinda Block, MD       Allergies  Allergen Reactions  . Augmentin [Amoxicillin-Pot Clavulanate]   . Other     "CLEANING MATERIALS"   Family  History  Problem Relation Age of Onset  . Breast cancer Mother        Age 66  . Breast cancer Maternal Grandmother        Age 84's  . Diabetes Maternal Grandfather   . Cancer Paternal Aunt        Cervical  . Diabetes Paternal Uncle   . Breast cancer Paternal Aunt        Age 85's   PE: BP 110/62 (BP Location: Left Arm, Patient Position: Sitting)   Pulse 65   Wt 156 lb (70.8 kg)   SpO2 97%   BMI 25.18 kg/m  Body mass index is 25.18 kg/m.  Wt Readings from Last 3 Encounters:  10/05/16 156 lb (70.8 kg)  09/15/16 153 lb 12.8 oz (69.8 kg)  04/02/16 155 lb (70.3 kg)   Constitutional: normal weight, in NAD Eyes: PERRLA, EOMI, no exophthalmos ENT: moist mucous membranes, no thyromegaly, no cervical lymphadenopathy Cardiovascular: RRR, No MRG Respiratory: CTA B Gastrointestinal: abdomen soft, NT, ND, BS+ Musculoskeletal: no deformities, strength intact in all 4 Skin: moist, warm, no rashes Neurological: no tremor with outstretched hands, DTR normal in all 4  ASSESSMENT: 1. Hashimoto's Hypothyroidism  PLAN:  1. Patient with long-standing Uncontrolledhypothyroidism, on Armour thyroid. Since switching to Armour she feels better, but still has a myriad of symptoms that, at this visit, she believes may be due to approaching menopause. She does want to make sure that she does not need optimization of her thyroid medication. We discussed about the possibility of going back to Synthroid, which she does not want to entertain since she did not feel good on this. We also discussed about other different options for treating hypothyroidism, specifically addressing her question whether a combination of Synthroid and Armour would be a good idea. It would not be, and I explained why. - latest thyroid labs reviewed with pt >> slightly low, at 0.17, at which time, PCP advised her to decrease the dose of Armour to half. She did this for only a few days and then went back to the original dose. We did  discuss at this visit that the decreasing dose would have been a little too drastic, so we decided to alternate between 90 and 120 mg every other day for now and have her back for labs in 1.5 months: TSH, free T3, and free T4 - She agrees with the plan - we discussed about taking the thyroid hormone every day, with water, >30 minutes before breakfast, separated by >4 hours from acid reflux medications, calcium, iron, multivitamins. Pt. is taking it correctly.  She takes her coffee with creamer in the morning, but separated by  more than 30 minutes from her thyroid medication - Return in about 6 months (around 04/07/2017).   Orders Placed This Encounter  Procedures  . T3, free  . T4, free  . TSH   - time spent with the patient: 30 min of which >50% was spent in obtaining information about her symptoms, reviewing her previous labs, evaluations, and treatments, counseling her about her condition (please see the discussed topics above), and developing a plan to further investigate and treat it; she had a number of questions which I addressed.  Philemon Kingdom, MD PhD [facility]

## 2016-10-19 ENCOUNTER — Telehealth: Payer: Self-pay | Admitting: Physician Assistant

## 2016-10-19 NOTE — Telephone Encounter (Signed)
Pt states that you was to put a referral to a psychiatry in for her. I did not see the referral that was put in.  Please adv  Contact number [phone]

## 2016-10-22 NOTE — Telephone Encounter (Signed)
Please advise 

## 2016-10-22 NOTE — Telephone Encounter (Signed)
Pt advised.

## 2016-10-22 NOTE — Telephone Encounter (Signed)
I placed the referral on her 09/15/16 visit. According to the referral tab, it was:   Sent to [place] on 8/14     Please call pt and let her know. Thanks!

## 2016-12-06 ENCOUNTER — Encounter: Payer: Self-pay | Admitting: Women's Health

## 2016-12-06 ENCOUNTER — Ambulatory Visit (INDEPENDENT_AMBULATORY_CARE_PROVIDER_SITE_OTHER): Payer: BC Managed Care – PPO | Admitting: Women's Health

## 2016-12-06 VITALS — BP 132/80

## 2016-12-06 DIAGNOSIS — N898 Other specified noninflammatory disorders of vagina: Secondary | ICD-10-CM

## 2016-12-06 DIAGNOSIS — N760 Acute vaginitis: Secondary | ICD-10-CM | POA: Diagnosis not present

## 2016-12-06 DIAGNOSIS — B9689 Other specified bacterial agents as the cause of diseases classified elsewhere: Secondary | ICD-10-CM

## 2016-12-06 LAB — WET PREP FOR TRICH, YEAST, CLUE

## 2016-12-06 MED ORDER — FLUCONAZOLE 150 MG PO TABS: 150.0000 mg | ORAL_TABLET | Freq: Once | ORAL | 1 refills | Status: AC

## 2016-12-06 NOTE — Progress Notes (Signed)
Presents with complaint of vaginal irritation with itching and dryness for the past month. 08/2014 Mirena IUD rare spotting, hypothyroid on Synthroid. Denies urinary symptoms, abdominal pain or fever. Same partner. Drinks alcohol daily. Teacher.  Exam: Appears well. External genitalia mild erythema, speculum exam scant blood, odor noted, IUD strings visible, wet prep positive for clues, TNTC bacteria. Bimanual no CMT or adnexal tenderness.  Bacteria vaginosis  Plan: Samantha Golden Prescription, proper use given and reviewed no alcohol, one time applicator. Instructed to call if no relief. Over-the-counter vaginal lubricants. Reviewed perimenopausal vaginal dryness. Aware of need to decrease and avoid alcohol. Partner history of pancreatitis.

## 2016-12-06 NOTE — Patient Instructions (Signed)
Bacterial Vaginosis Bacterial vaginosis is a vaginal infection that occurs when the normal balance of bacteria in the vagina is disrupted. It results from an overgrowth of certain bacteria. This is the most common vaginal infection among women ages 15-44. Because bacterial vaginosis increases your risk for STIs (sexually transmitted infections), getting treated can help reduce your risk for chlamydia, gonorrhea, herpes, and HIV (human immunodeficiency virus). Treatment is also important for preventing complications in pregnant women, because this condition can cause an early (premature) delivery. What are the causes? This condition is caused by an increase in harmful bacteria that are normally present in small amounts in the vagina. However, the reason that the condition develops is not fully understood. What increases the risk? The following factors may make you more likely to develop this condition:  Having a new sexual partner or multiple sexual partners.  Having unprotected sex.  Douching.  Having an intrauterine device (IUD).  Smoking.  Drug and alcohol abuse.  Taking certain antibiotic medicines.  Being pregnant.  You cannot get bacterial vaginosis from toilet seats, bedding, swimming pools, or contact with objects around you. What are the signs or symptoms? Symptoms of this condition include:  Grey or white vaginal discharge. The discharge can also be watery or foamy.  A fish-like odor with discharge, especially after sexual intercourse or during menstruation.  Itching in and around the vagina.  Burning or pain with urination.  Some women with bacterial vaginosis have no signs or symptoms. How is this diagnosed? This condition is diagnosed based on:  Your medical history.  A physical exam of the vagina.  Testing a sample of vaginal fluid under a microscope to look for a large amount of bad bacteria or abnormal cells. Your health care provider may use a cotton swab  or a small wooden spatula to collect the sample.  How is this treated? This condition is treated with antibiotics. These may be given as a pill, a vaginal cream, or a medicine that is put into the vagina (suppository). If the condition comes back after treatment, a second round of antibiotics may be needed. Follow these instructions at home: Medicines  Take over-the-counter and prescription medicines only as told by your health care provider.  Take or use your antibiotic as told by your health care provider. Do not stop taking or using the antibiotic even if you start to feel better. General instructions  If you have a female sexual partner, tell her that you have a vaginal infection. She should see her health care provider and be treated if she has symptoms. If you have a female sexual partner, he does not need treatment.  During treatment: ? Avoid sexual activity until you finish treatment. ? Do not douche. ? Avoid alcohol as directed by your health care provider. ? Avoid breastfeeding as directed by your health care provider.  Drink enough water and fluids to keep your urine clear or pale yellow.  Keep the area around your vagina and rectum clean. ? Wash the area daily with warm water. ? Wipe yourself from front to back after using the toilet.  Keep all follow-up visits as told by your health care provider. This is important. How is this prevented?  Do not douche.  Wash the outside of your vagina with warm water only.  Use protection when having sex. This includes latex condoms and dental dams.  Limit how many sexual partners you have. To help prevent bacterial vaginosis, it is best to have sex with just   one partner (monogamous).  Make sure you and your sexual partner are tested for STIs.  Wear cotton or cotton-lined underwear.  Avoid wearing tight pants and pantyhose, especially during summer.  Limit the amount of alcohol that you drink.  Do not use any products that  contain nicotine or tobacco, such as cigarettes and e-cigarettes. If you need help quitting, ask your health care provider.  Do not use illegal drugs. Where to find more information:  Centers for Disease Control and Prevention: www.cdc.gov/std  American Sexual Health Association (ASHA): www.ashastd.org  U.S. Department of Health and Human Services, Office on Women's Health: www.womenshealth.gov/ or https://www.womenshealth.gov/a-z-topics/bacterial-vaginosis Contact a health care provider if:  Your symptoms do not improve, even after treatment.  You have more discharge or pain when urinating.  You have a fever.  You have pain in your abdomen.  You have pain during sex.  You have vaginal bleeding between periods. Summary  Bacterial vaginosis is a vaginal infection that occurs when the normal balance of bacteria in the vagina is disrupted.  Because bacterial vaginosis increases your risk for STIs (sexually transmitted infections), getting treated can help reduce your risk for chlamydia, gonorrhea, herpes, and HIV (human immunodeficiency virus). Treatment is also important for preventing complications in pregnant women, because the condition can cause an early (premature) delivery.  This condition is treated with antibiotic medicines. These may be given as a pill, a vaginal cream, or a medicine that is put into the vagina (suppository). This information is not intended to replace advice given to you by your health care provider. Make sure you discuss any questions you have with your health care provider. Document Released: 02/22/2005 Document Revised: 11/08/2015 Document Reviewed: 11/08/2015 Elsevier Interactive Patient Education  2017 Elsevier Inc.  

## 2016-12-23 ENCOUNTER — Telehealth: Payer: Self-pay | Admitting: *Deleted

## 2016-12-23 NOTE — Telephone Encounter (Signed)
(  pt aware you are out of the office) Patient called to updated from [place] 12/06/16 states Nuvessa gel worked great, no symptoms the next day. Only issue was gel leaked out of vagina at bedtime ( I did tell her that happens with gel) besides that works great. Pt did say that she feels BV is returning as of the am, irritation, itchy and odor. She asked if refill on Nuvessa or metrogel could be sent to pharmacy? Pt leaving out of town would like to have on hand if possible.  please advise.

## 2016-12-24 MED ORDER — METRONIDAZOLE 1.3 % VA GEL: 1.3000 "application " | Freq: Once | VAGINAL | 0 refills | Status: AC

## 2016-12-24 NOTE — Telephone Encounter (Signed)
Ok for refill? 

## 2016-12-24 NOTE — Telephone Encounter (Signed)
[facility] for refill of nuvessa, may be expensive. Office visit if no relief.

## 2016-12-24 NOTE — Telephone Encounter (Signed)
Samantha Golden young called me and told me with verbal okay that Rx can be sent as well. Samantha Golden is not in office and epic kept login her out. No drinking alcohol review with taking medication. Rx sent.

## 2017-01-17 ENCOUNTER — Encounter: Payer: Self-pay | Admitting: Family Medicine

## 2017-01-17 ENCOUNTER — Other Ambulatory Visit: Payer: Self-pay

## 2017-01-17 ENCOUNTER — Ambulatory Visit: Payer: BC Managed Care – PPO | Admitting: Family Medicine

## 2017-01-17 VITALS — BP 116/78 | HR 70 | Temp 98.2°F | Resp 16 | Ht 66.0 in | Wt 152.6 lb

## 2017-01-17 DIAGNOSIS — R062 Wheezing: Secondary | ICD-10-CM

## 2017-01-17 DIAGNOSIS — J45901 Unspecified asthma with (acute) exacerbation: Secondary | ICD-10-CM

## 2017-01-17 DIAGNOSIS — J309 Allergic rhinitis, unspecified: Secondary | ICD-10-CM

## 2017-01-17 DIAGNOSIS — J4521 Mild intermittent asthma with (acute) exacerbation: Secondary | ICD-10-CM

## 2017-01-17 MED ORDER — PREDNISONE 20 MG PO TABS: 40.0000 mg | ORAL_TABLET | Freq: Every day | ORAL | 0 refills | Status: DC

## 2017-01-17 MED ORDER — AZITHROMYCIN 250 MG PO TABS: ORAL_TABLET | ORAL | 0 refills | Status: DC

## 2017-01-17 MED ORDER — FLUTICASONE PROPIONATE HFA 44 MCG/ACT IN AERO: 2.0000 | INHALATION_SPRAY | Freq: Two times a day (BID) | RESPIRATORY_TRACT | 12 refills | Status: DC

## 2017-01-17 MED ORDER — ALBUTEROL SULFATE 108 (90 BASE) MCG/ACT IN AEPB
1.0000 | INHALATION_SPRAY | RESPIRATORY_TRACT | 0 refills | Status: DC | PRN
Start: 2017-01-17 — End: 2018-03-04

## 2017-01-17 MED ORDER — ALBUTEROL SULFATE (2.5 MG/3ML) 0.083% IN NEBU
2.5000 mg | INHALATION_SOLUTION | Freq: Once | RESPIRATORY_TRACT | Status: AC
  Administered 2017-01-17: 2.5 mg via RESPIRATORY_TRACT

## 2017-01-17 NOTE — Patient Instructions (Addendum)
Increase the flonase to 2 sprays per nostril each day and add allegra or zyrtec once per day. Albuterol if needed, but prednisone should help calm down the wheeze.  After prednisone, start flovent inhaler. If more productive or worsening cough, can also start Zpak.   Follow up in next 6-8 weeks for recheck of asthma symptoms.   Return to the clinic or go to the nearest emergency room if any of your symptoms worsen or new symptoms occur.   Asthma, Adult Asthma is a recurring condition in which the airways tighten and narrow. Asthma can make it difficult to breathe. It can cause coughing, wheezing, and shortness of breath. Asthma episodes, also called asthma attacks, range from minor to life-threatening. Asthma cannot be cured, but medicines and lifestyle changes can help control it. What are the causes? Asthma is believed to be caused by inherited (genetic) and environmental factors, but its exact cause is unknown. Asthma may be triggered by allergens, lung infections, or irritants in the air. Asthma triggers are different for each person. Common triggers include:  Animal dander.  Dust mites.  Cockroaches.  Pollen from trees or grass.  Mold.  Smoke.  Air pollutants such as dust, household cleaners, hair sprays, aerosol sprays, paint fumes, strong chemicals, or strong odors.  Cold air, weather changes, and winds (which increase molds and pollens in the air).  Strong emotional expressions such as crying or laughing hard.  Stress.  Certain medicines (such as aspirin) or types of drugs (such as beta-blockers).  Sulfites in foods and drinks. Foods and drinks that may contain sulfites include dried fruit, potato chips, and sparkling grape juice.  Infections or inflammatory conditions such as the flu, a cold, or an inflammation of the nasal membranes (rhinitis).  Gastroesophageal reflux disease (GERD).  Exercise or strenuous activity.  What are the signs or symptoms? Symptoms may  occur immediately after asthma is triggered or many hours later. Symptoms include:  Wheezing.  Excessive nighttime or early morning coughing.  Frequent or severe coughing with a common cold.  Chest tightness.  Shortness of breath.  How is this diagnosed? The diagnosis of asthma is made by a review of your medical history and a physical exam. Tests may also be performed. These may include:  Lung function studies. These tests show how much air you breathe in and out.  Allergy tests.  Imaging tests such as X-rays.  How is this treated? Asthma cannot be cured, but it can usually be controlled. Treatment involves identifying and avoiding your asthma triggers. It also involves medicines. There are 2 classes of medicine used for asthma treatment:  Controller medicines. These prevent asthma symptoms from occurring. They are usually taken every day.  Reliever or rescue medicines. These quickly relieve asthma symptoms. They are used as needed and provide short-term relief.  Your health care provider will help you create an asthma action plan. An asthma action plan is a written plan for managing and treating your asthma attacks. It includes a list of your asthma triggers and how they may be avoided. It also includes information on when medicines should be taken and when their dosage should be changed. An action plan may also involve the use of a device called a peak flow meter. A peak flow meter measures how well the lungs are working. It helps you monitor your condition. Follow these instructions at home:  Take medicines only as directed by your health care provider. Speak with your health care provider if you have questions  about how or when to take the medicines.  Use a peak flow meter as directed by your health care provider. Record and keep track of readings.  Understand and use the action plan to help minimize or stop an asthma attack without needing to seek medical care.  Control  your home environment in the following ways to help prevent asthma attacks: ? Do not smoke. Avoid being exposed to secondhand smoke. ? Change your heating and air conditioning filter regularly. ? Limit your use of fireplaces and wood stoves. ? Get rid of pests (such as roaches and mice) and their droppings. ? Throw away plants if you see mold on them. ? Clean your floors and dust regularly. Use unscented cleaning products. ? Try to have someone else vacuum for you regularly. Stay out of rooms while they are being vacuumed and for a short while afterward. If you vacuum, use a dust mask from a hardware store, a double-layered or microfilter vacuum cleaner bag, or a vacuum cleaner with a HEPA filter. ? Replace carpet with wood, tile, or vinyl flooring. Carpet can trap dander and dust. ? Use allergy-proof pillows, mattress covers, and box spring covers. ? Wash bed sheets and blankets every week in hot water and dry them in a dryer. ? Use blankets that are made of polyester or cotton. ? Clean bathrooms and kitchens with bleach. If possible, have someone repaint the walls in these rooms with mold-resistant paint. Keep out of the rooms that are being cleaned and painted. ? Wash hands frequently. Contact a health care provider if:  You have wheezing, shortness of breath, or a cough even if taking medicine to prevent attacks.  The colored mucus you cough up (sputum) is thicker than usual.  Your sputum changes from clear or white to yellow, green, gray, or bloody.  You have any problems that may be related to the medicines you are taking (such as a rash, itching, swelling, or trouble breathing).  You are using a reliever medicine more than 2-3 times per week.  Your peak flow is still at 50-79% of your personal best after following your action plan for 1 hour.  You have a fever. Get help right away if:  You seem to be getting worse and are unresponsive to treatment during an asthma attack.  You  are short of breath even at rest.  You get short of breath when doing very little physical activity.  You have difficulty eating, drinking, or talking due to asthma symptoms.  You develop chest pain.  You develop a fast heartbeat.  You have a bluish color to your lips or fingernails.  You are light-headed, dizzy, or faint.  Your peak flow is less than 50% of your personal best. This information is not intended to replace advice given to you by your health care provider. Make sure you discuss any questions you have with your health care provider. Document Released: 02/22/2005 Document Revised: 08/06/2015 Document Reviewed: 09/21/2012 Elsevier Interactive Patient Education  2017 [employer].    IF you received an x-ray today, you will receive an invoice from [facility]. Please contact [facility] at [phone] with questions or concerns regarding your invoice.   IF you received labwork today, you will receive an invoice from [facility]. Please contact [facility] at [phone] with questions or concerns regarding your invoice.   Our billing staff will not be able to assist you with questions regarding bills from these companies.  You will be contacted with the lab results  as soon as they are available. The fastest way to get your results is to activate your My Chart account. Instructions are located on the last page of this paperwork. If you have not heard from [place] regarding the results in 2 weeks, please contact this office.

## 2017-01-17 NOTE — Progress Notes (Addendum)
Subjective:  By signing my name below, I, Moises Blood, attest that this documentation has been prepared under the direction and in the presence of Merri Ray, MD. Electronically Signed: Moises Blood, [place]. 01/17/2017 , 2:36 PM .  Patient was seen in Room 9 .   Patient ID: Samantha Golden, female    DOB: 1965/10/24, 51 y.o.   MRN: [id number] Chief Complaint  Patient presents with  . Cough    productive with clear-yellow mucus   HPI Samantha Golden is a 51 y.o. female  Here with productive cough. She has a history of hypothyroidism. It appears she was seen in Oct 2017 with cough and some wheezing at that time. She was treated with albuterol, prednisone and cough syrup at that time. She was referred to an allergist.   Patient states her symptoms started early October (about 4 weeks ago). She reports having similar symptoms start every year, and has been "typical for her" for the past several years. These symptoms and illness would last until about January. Today, she feels better after mowing the yard this morning after breathing cold, crisp air. She states her symptoms worse in the evenings and in the early mornings. In the mornings, she has deep barking coughs with some yellow mucus. She informs starting with some nasal drainage/drip back of her throat. She's been using flonase about 2 weeks ago; 1 spray in each nostril before bed, and 1 spray in each nostril before work in the mornings. She denies any fever.   She's been using an inhaler for wheezing when laying down going to bed and in the mornings. She uses inhaler before sleeping at night, and first thing in the morning, but not always. She denies taking zyrtec, claritin or mucinex. She rarely uses inhaler during the summer; more so during autumn and winter. She denies seeing allergist. She denies heartburn.   She works as a Technical sales engineer at [employer].   Patient Active Problem List   Diagnosis Date Noted    . BV (bacterial vaginosis) 12/06/2016  . 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 Hypothyroidism due to Hashimoto's thyroiditis    Past Medical History:  Diagnosis Date  . Abnormal cervical Papanicolaou smear    ? LASER TREATMENT   . Allergy   . BRCA negative 08/09  . Cervical dysplasia    Past Surgical History:  Procedure Laterality Date  . ADENOIDECTOMY    . CERVIX LESION DESTRUCTION    . COLPOSCOPY    . INTRAUTERINE DEVICE (IUD) INSERTION  09/04/2014   Mirena   Allergies  Allergen Reactions  . Augmentin [Amoxicillin-Pot Clavulanate]   . Other     "CLEANING MATERIALS"   Prior to Admission medications   Medication Sig Start Date End Date Taking? Authorizing Provider  ARMOUR THYROID 120 MG tablet TAKE 1 TABLET BEFORE BREAKFAST every other day 10/05/16   Philemon Kingdom, MD  ARMOUR THYROID 90 MG tablet Take 1 tablet (90 mg total) by mouth every other day. 10/05/16   Philemon Kingdom, MD  Calcium Carbonate-Vitamin D (CALCIUM + D PO) Take by mouth. Reported on 08/11/2015    [provider]  estradiol (ESTRACE) 1 MG tablet TAKE 1 TABLET ONCE DAILY. Patient taking differently: as needed 08/28/15   Huel Cote, NP   Social History   Socioeconomic History  . Marital status: Divorced    Spouse name: Not on file  . Number of children: Not on file  . Years  of education: Not on file  . Highest education level: Not on file  Social Needs  . Financial resource strain: Not on file  . Food insecurity - worry: Not on file  . Food insecurity - inability: Not on file  . Transportation needs - medical: Not on file  . Transportation needs - non-medical: Not on file  Occupational History  . Not on file  Tobacco Use  . Smoking status: Former Smoker    Years: 10.00    Types: Cigarettes  . Smokeless tobacco: Never Used  Substance and Sexual Activity  . Alcohol use: Yes    Alcohol/week: 1.0 oz    Types: 2 Standard drinks or equivalent  per week  . Drug use: No  . Sexual activity: Yes    Birth control/protection: IUD    Comment: MIRENA 09/04/2014  Other Topics Concern  . Not on file  Social History Narrative  . Not on file   Review of Systems  Constitutional: Negative for chills, fatigue, fever and unexpected weight change.  HENT: Positive for congestion and postnasal drip.   Respiratory: Positive for cough and wheezing.   Gastrointestinal: Negative for constipation, diarrhea, nausea and vomiting.  Neurological: Negative for dizziness, weakness and headaches.       Objective:   Physical Exam  Constitutional: She is oriented to person, place, and time. She appears well-developed and well-nourished. No distress.  HENT:  Head: Normocephalic and atraumatic.  Right Ear: Hearing, tympanic membrane, external ear and ear canal normal.  Left Ear: Hearing, tympanic membrane, external ear and ear canal normal.  Nose: Nose normal.  Mouth/Throat: Oropharynx is clear and moist. No oropharyngeal exudate.  Eyes: Conjunctivae and EOM are normal. Pupils are equal, round, and reactive to light.  Cardiovascular: Normal rate, regular rhythm, normal heart sounds and intact distal pulses.  No murmur heard. Pulmonary/Chest: Effort normal. No respiratory distress. She has wheezes (diffuse). She has no rhonchi.  Neurological: She is alert and oriented to person, place, and time.  Skin: Skin is warm and dry. No rash noted.  Psychiatric: She has a normal mood and affect. Her behavior is normal.  Vitals reviewed. Albuterol 2.5 mg neb given, clearing of wheeze, lungs clear  Vitals:   01/17/17 1410  BP: 116/78  Pulse: 70  Resp: 16  Temp: 98.2 F (36.8 C)  TempSrc: Oral  SpO2: 98%  Weight: 152 lb 9.6 oz (69.2 kg)  Height: '5\' 6"'  (1.676 m)     Assessment & Plan:    Samantha Golden is a 51 y.o. female Wheezing - Plan: albuterol (PROVENTIL) (2.5 MG/3ML) 0.083% nebulizer solution 2.5 mg  Allergic rhinitis, unspecified seasonality,  unspecified trigger  Mild intermittent asthma with acute exacerbation - Plan: predniSONE (DELTASONE) 20 MG tablet, Albuterol Sulfate (PROAIR RESPICLICK) 830 (90 Base) MCG/ACT AEPB, fluticasone (FLOVENT HFA) 44 MCG/ACT inhaler, CANCELED: Peak flow  Asthmatic bronchitis with acute exacerbation, unspecified asthma severity, unspecified whether persistent - Plan: azithromycin (ZITHROMAX) 250 MG tablet  Possible initial viral illness with persistent cough, primarily nonproductive. He is on exam, and based on history, with intermittent use of albuterol previously suspect some component of asthma, as well as allergic rhinitis. Improved peak flow with fairly good effort after albuterol neb, . Reassuring O2 sat after neb.    - prednisone 21m qd for 5 days. Side effects discussed.   -start flovent after prednisone.   - albuterol if needed, rtc precautions discussed.   - allegra, flonase for allergies.   -recheck in next 6-8 weeks  to decide on continuation of inhaled corticosteroid. Likely will at least need inhaled corticosteroid during fall season.   - Azithromycin printed if more productive cough or not improving into next week. Currently less likely infectious. Side effects of antibiotics discussed   Meds ordered this encounter  Medications  . albuterol (PROVENTIL) (2.5 MG/3ML) 0.083% nebulizer solution 2.5 mg  . predniSONE (DELTASONE) 20 MG tablet    Sig: Take 2 tablets (40 mg total) daily with breakfast by mouth.    Dispense:  10 tablet    Refill:  0  . Albuterol Sulfate (PROAIR RESPICLICK) 762 (90 Base) MCG/ACT AEPB    Sig: Inhale 1 Inhaler every 4 (four) hours as needed into the lungs.    Dispense:  1 each    Refill:  0  . fluticasone (FLOVENT HFA) 44 MCG/ACT inhaler    Sig: Inhale 2 puffs 2 (two) times daily into the lungs.    Dispense:  1 Inhaler    Refill:  12  . azithromycin (ZITHROMAX) 250 MG tablet    Sig: Take 2 pills by mouth on day 1, then 1 pill by mouth per day on days 2  through 5.    Dispense:  6 tablet    Refill:  0   Patient Instructions    Increase the flonase to 2 sprays per nostril each day and add allegra or zyrtec once per day. Albuterol if needed, but prednisone should help calm down the wheeze.  After prednisone, start flovent inhaler. If more productive or worsening cough, can also start Zpak.   Follow up in next 6-8 weeks for recheck of asthma symptoms.   Return to the clinic or go to the nearest emergency room if any of your symptoms worsen or new symptoms occur.   Asthma, Adult Asthma is a recurring condition in which the airways tighten and narrow. Asthma can make it difficult to breathe. It can cause coughing, wheezing, and shortness of breath. Asthma episodes, also called asthma attacks, range from minor to life-threatening. Asthma cannot be cured, but medicines and lifestyle changes can help control it. What are the causes? Asthma is believed to be caused by inherited (genetic) and environmental factors, but its exact cause is unknown. Asthma may be triggered by allergens, lung infections, or irritants in the air. Asthma triggers are different for each person. Common triggers include:  Animal dander.  Dust mites.  Cockroaches.  Pollen from trees or grass.  Mold.  Smoke.  Air pollutants such as dust, household cleaners, hair sprays, aerosol sprays, paint fumes, strong chemicals, or strong odors.  Cold air, weather changes, and winds (which increase molds and pollens in the air).  Strong emotional expressions such as crying or laughing hard.  Stress.  Certain medicines (such as aspirin) or types of drugs (such as beta-blockers).  Sulfites in foods and drinks. Foods and drinks that may contain sulfites include dried fruit, potato chips, and sparkling grape juice.  Infections or inflammatory conditions such as the flu, a cold, or an inflammation of the nasal membranes (rhinitis).  Gastroesophageal reflux disease  (GERD).  Exercise or strenuous activity.  What are the signs or symptoms? Symptoms may occur immediately after asthma is triggered or many hours later. Symptoms include:  Wheezing.  Excessive nighttime or early morning coughing.  Frequent or severe coughing with a common cold.  Chest tightness.  Shortness of breath.  How is this diagnosed? The diagnosis of asthma is made by a review of your medical history and a physical  exam. Tests may also be performed. These may include:  Lung function studies. These tests show how much air you breathe in and out.  Allergy tests.  Imaging tests such as X-rays.  How is this treated? Asthma cannot be cured, but it can usually be controlled. Treatment involves identifying and avoiding your asthma triggers. It also involves medicines. There are 2 classes of medicine used for asthma treatment:  Controller medicines. These prevent asthma symptoms from occurring. They are usually taken every day.  Reliever or rescue medicines. These quickly relieve asthma symptoms. They are used as needed and provide short-term relief.  Your health care provider will help you create an asthma action plan. An asthma action plan is a written plan for managing and treating your asthma attacks. It includes a list of your asthma triggers and how they may be avoided. It also includes information on when medicines should be taken and when their dosage should be changed. An action plan may also involve the use of a device called a peak flow meter. A peak flow meter measures how well the lungs are working. It helps you monitor your condition. Follow these instructions at home:  Take medicines only as directed by your health care provider. Speak with your health care provider if you have questions about how or when to take the medicines.  Use a peak flow meter as directed by your health care provider. Record and keep track of readings.  Understand and use the action plan to  help minimize or stop an asthma attack without needing to seek medical care.  Control your home environment in the following ways to help prevent asthma attacks: ? Do not smoke. Avoid being exposed to secondhand smoke. ? Change your heating and air conditioning filter regularly. ? Limit your use of fireplaces and wood stoves. ? Get rid of pests (such as roaches and mice) and their droppings. ? Throw away plants if you see mold on them. ? Clean your floors and dust regularly. Use unscented cleaning products. ? Try to have someone else vacuum for you regularly. Stay out of rooms while they are being vacuumed and for a short while afterward. If you vacuum, use a dust mask from a hardware store, a double-layered or microfilter vacuum cleaner bag, or a vacuum cleaner with a HEPA filter. ? Replace carpet with wood, tile, or vinyl flooring. Carpet can trap dander and dust. ? Use allergy-proof pillows, mattress covers, and box spring covers. ? Wash bed sheets and blankets every week in hot water and dry them in a dryer. ? Use blankets that are made of polyester or cotton. ? Clean bathrooms and kitchens with bleach. If possible, have someone repaint the walls in these rooms with mold-resistant paint. Keep out of the rooms that are being cleaned and painted. ? Wash hands frequently. Contact a health care provider if:  You have wheezing, shortness of breath, or a cough even if taking medicine to prevent attacks.  The colored mucus you cough up (sputum) is thicker than usual.  Your sputum changes from clear or white to yellow, green, gray, or bloody.  You have any problems that may be related to the medicines you are taking (such as a rash, itching, swelling, or trouble breathing).  You are using a reliever medicine more than 2-3 times per week.  Your peak flow is still at 50-79% of your personal best after following your action plan for 1 hour.  You have a fever. Get help right away  if:  You  seem to be getting worse and are unresponsive to treatment during an asthma attack.  You are short of breath even at rest.  You get short of breath when doing very little physical activity.  You have difficulty eating, drinking, or talking due to asthma symptoms.  You develop chest pain.  You develop a fast heartbeat.  You have a bluish color to your lips or fingernails.  You are light-headed, dizzy, or faint.  Your peak flow is less than 50% of your personal best. This information is not intended to replace advice given to you by your health care provider. Make sure you discuss any questions you have with your health care provider. Document Released: 02/22/2005 Document Revised: 08/06/2015 Document Reviewed: 09/21/2012 Elsevier Interactive Patient Education  2017 [employer].    IF you received an x-ray today, you will receive an invoice from [facility]. Please contact [facility] at [phone] with questions or concerns regarding your invoice.   IF you received labwork today, you will receive an invoice from [facility]. Please contact [facility] at [phone] with questions or concerns regarding your invoice.   Our billing staff will not be able to assist you with questions regarding bills from these companies.  You will be contacted with the lab results as soon as they are available. The fastest way to get your results is to activate your My Chart account. Instructions are located on the last page of this paperwork. If you have not heard from [place] regarding the results in 2 weeks, please contact this office.      I personally performed the services described in this documentation, which was scribed in my presence. The recorded information has been reviewed and considered for accuracy and completeness, addended by me as needed, and agree with information above.  Signed,   Merri Ray, MD [facility].  01/18/17 8:16  AM

## 2017-01-24 ENCOUNTER — Other Ambulatory Visit: Payer: Self-pay | Admitting: Physician Assistant

## 2017-01-24 DIAGNOSIS — F1010 Alcohol abuse, uncomplicated: Secondary | ICD-10-CM

## 2017-01-24 NOTE — Progress Notes (Signed)
Please call patient and let her know that I have replaced the referral for psychiatry.  If she does not hear from anyone in 1-2 weeks, call our office and let us know.

## 2017-01-25 ENCOUNTER — Telehealth: Payer: Self-pay | Admitting: *Deleted

## 2017-01-25 NOTE — Progress Notes (Signed)
Patient informed. 

## 2017-01-25 NOTE — Telephone Encounter (Signed)
error 

## 2017-03-24 ENCOUNTER — Ambulatory Visit (HOSPITAL_COMMUNITY): Payer: BC Managed Care – PPO | Admitting: Psychiatry

## 2017-04-07 ENCOUNTER — Encounter (HOSPITAL_COMMUNITY): Payer: Self-pay | Admitting: Psychiatry

## 2017-04-07 ENCOUNTER — Encounter: Payer: Self-pay | Admitting: Internal Medicine

## 2017-04-07 ENCOUNTER — Ambulatory Visit: Payer: BC Managed Care – PPO | Admitting: Internal Medicine

## 2017-04-07 ENCOUNTER — Ambulatory Visit (HOSPITAL_COMMUNITY): Payer: BC Managed Care – PPO | Admitting: Psychiatry

## 2017-04-07 VITALS — BP 120/68 | HR 73 | Ht 66.0 in | Wt 153.8 lb

## 2017-04-07 VITALS — BP 100/64 | HR 75 | Ht 66.0 in | Wt 151.8 lb

## 2017-04-07 DIAGNOSIS — F419 Anxiety disorder, unspecified: Secondary | ICD-10-CM

## 2017-04-07 DIAGNOSIS — E063 Autoimmune thyroiditis: Secondary | ICD-10-CM

## 2017-04-07 DIAGNOSIS — Z975 Presence of (intrauterine) contraceptive device: Secondary | ICD-10-CM | POA: Diagnosis not present

## 2017-04-07 DIAGNOSIS — F1910 Other psychoactive substance abuse, uncomplicated: Secondary | ICD-10-CM | POA: Diagnosis not present

## 2017-04-07 DIAGNOSIS — Z87891 Personal history of nicotine dependence: Secondary | ICD-10-CM

## 2017-04-07 DIAGNOSIS — G4700 Insomnia, unspecified: Secondary | ICD-10-CM | POA: Diagnosis not present

## 2017-04-07 DIAGNOSIS — Z811 Family history of alcohol abuse and dependence: Secondary | ICD-10-CM

## 2017-04-07 DIAGNOSIS — F1020 Alcohol dependence, uncomplicated: Secondary | ICD-10-CM | POA: Diagnosis not present

## 2017-04-07 DIAGNOSIS — N951 Menopausal and female climacteric states: Secondary | ICD-10-CM | POA: Diagnosis not present

## 2017-04-07 DIAGNOSIS — R450 Nervousness: Secondary | ICD-10-CM | POA: Diagnosis not present

## 2017-04-07 DIAGNOSIS — F1010 Alcohol abuse, uncomplicated: Secondary | ICD-10-CM

## 2017-04-07 DIAGNOSIS — E038 Other specified hypothyroidism: Secondary | ICD-10-CM | POA: Diagnosis not present

## 2017-04-07 LAB — T3, FREE: T3 FREE: 4.4 pg/mL — AB (ref 2.3–4.2)

## 2017-04-07 LAB — TSH: TSH: 0.03 u[IU]/mL — AB (ref 0.35–4.50)

## 2017-04-07 LAB — T4, FREE: FREE T4: 0.92 ng/dL (ref 0.60–1.60)

## 2017-04-07 MED ORDER — NALTREXONE HCL 50 MG PO TABS: 50.0000 mg | ORAL_TABLET | Freq: Every day | ORAL | 0 refills | Status: DC

## 2017-04-07 NOTE — Progress Notes (Signed)
Psychiatric Initial Adult Assessment   Patient Identification: Samantha Golden MRN:  [id number] Date of Evaluation:  04/07/2017 Referral Source: Self-referred.  Chief Complaint:  I need help to stop my drinking.  Visit Diagnosis: No diagnosis found.  History of Present Illness: Patient is 52 year old Caucasian, employed, divorced female who is self-referred for seeking help to stop drinking.  Patient told she is started drinking 7 years ago when she entered in any relationship.  Her partner is a heavy drinker and she started to drink with him to the point that now she is drinking 5-6 liquor every day.  Recently her boyfriend diagnosed with pancreatitis and he had stopped drinking but patient continues to drink.  She admitted some time she drink due to boredom but also feel anxious and to calm herself.  She worried about the future and her own health.  Some days she gets very nervous.  She denies any blackouts, intoxication, seizures, withdrawal symptoms, tremors or shakes.  She do not remember any sobriety more than 24 hours in past 7 years.  She has 3 children who were out grown and lives out of town.  Patient denies any agitation, anger, mania, psychosis, hallucination, self abusive behavior, panic attacks or any OCD or PTSD symptoms.  She has never tried any psychiatric medication.  Now she is very concerned because she does not want to be dependent on drinking.  Patient has Hashimoto's thyroiditis.  She takes Armour for her thyroid disease.  She realized sometimes her emotions are out of control but she also believe it could be due to thyroid hormones.  Lately she has noticed hot flashes increased anxiety racing thoughts and weight gain.  She is seeing endocrinologist and she also like to get workup to rule out menopause.  Patient was never treated for alcohol but like to get help.  She reported her drinking to start when she comes back from work and if she is by herself she usually ends up drinking 4-5  glasses.  She noticed when she is with the people she does not drink as much.  Patient denies any feeling of hopelessness or worthlessness.  She denies any crying spells or any anhedonia.  She has done some research on naltrexone and willing to try to stop drinking.  Associated Signs/Symptoms: Depression Symptoms:  depressed mood, insomnia, anxiety, disturbed sleep, weight gain, (Hypo) Manic Symptoms:  Irritable Mood, Anxiety Symptoms:  Excessive Worry, Psychotic Symptoms:  no psychotic symptoms PTSD Symptoms: Negative  Past Psychiatric History: Patient denies any history of psychiatric inpatient treatment, suicidal attempt, mania, psychosis or any hallucination.  Previous Psychotropic Medications: No   Substance Abuse History in the last 12 months:  Yes.    Consequences of Substance Abuse: Patient started drinking alcohol 8 years ago.  In the beginning she was drinking to give company to her boyfriend but started drinking regularly every day.  She never had DUI or any legal consequences.  She never had any withdrawal symptoms.  She denies any blackouts or any seizures.  Past Medical History:  Past Medical History:  Diagnosis Date  . Abnormal cervical Papanicolaou smear    ? LASER TREATMENT   . Allergy   . BRCA negative 08/09  . Cervical dysplasia     Past Surgical History:  Procedure Laterality Date  . ADENOIDECTOMY    . CERVIX LESION DESTRUCTION    . COLPOSCOPY    . INTRAUTERINE DEVICE (IUD) INSERTION  09/04/2014   Mirena    Family Psychiatric History: Patient  reported brother has alcohol use.  Family History:  Family History  Problem Relation Age of Onset  . Breast cancer Mother        Age 20  . Breast cancer Maternal Grandmother        Age 38's  . Diabetes Maternal Grandfather   . Cancer Paternal Aunt        Cervical  . Diabetes Paternal Uncle   . Breast cancer Paternal Aunt        Age 71's    Social History:   Social History   Socioeconomic History   . Marital status: Divorced    Spouse name: None  . Number of children: None  . Years of education: None  . Highest education level: None  Social Needs  . Financial resource strain: None  . Food insecurity - worry: None  . Food insecurity - inability: None  . Transportation needs - medical: None  . Transportation needs - non-medical: None  Occupational History  . None  Tobacco Use  . Smoking status: Former Smoker    Years: 10.00    Types: Cigarettes  . Smokeless tobacco: Never Used  Substance and Sexual Activity  . Alcohol use: Yes    Frequency: Never    Comment: 4-6 a day  . Drug use: No  . Sexual activity: Yes    Birth control/protection: IUD    Comment: MIRENA 09/04/2014  Other Topics Concern  . None  Social History Narrative  . None    Additional Social History: Patient born and raised in [place].  She has 2 other siblings who lives out of town.  Her parents separated when she was in college.  Patient married once for 20 years but marriage falls apart.  She has 3 children who are grown and live on their own.  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  Allergies:   Allergies  Allergen Reactions  . Augmentin [Amoxicillin-Pot Clavulanate]   . Other     "CLEANING MATERIALS"    Metabolic Disorder Labs: No results found for this or any previous visit (from the past 2160 hour(s)). No results found for: HGBA1C, MPG No results found for: PROLACTIN Lab Results  Component Value Date   CHOL 194 04/08/2016   TRIG 87 04/08/2016   HDL 65 04/08/2016   CHOLHDL 3.0 04/08/2016   VLDL 17 04/08/2016   LDLCALC 112 (H) 04/08/2016   LDLCALC 113 03/27/2015     Current Medications: Current Outpatient Medications  Medication Sig Dispense Refill  . Albuterol Sulfate (PROAIR RESPICLICK) 950 (90 Base) MCG/ACT AEPB Inhale 1 Inhaler every 4 (four) hours as needed into the lungs. 1 each 0  . ARMOUR THYROID 120 MG tablet TAKE 1 TABLET BEFORE BREAKFAST every other day 45  tablet 12  . fluticasone (FLOVENT HFA) 44 MCG/ACT inhaler Inhale 2 puffs 2 (two) times daily into the lungs. 1 Inhaler 12  . estradiol (ESTRACE) 1 MG tablet TAKE 1 TABLET ONCE DAILY. (Patient not taking: Reported on 04/07/2017) 30 tablet 6   Current Facility-Administered Medications  Medication Dose Route Frequency Provider Last Rate Last Dose  . levonorgestrel (MIRENA) 20 MCG/24HR IUD   Intrauterine Once Fontaine, Belinda Block, MD        Neurologic: Headache: No Seizure: No Paresthesias:No  Musculoskeletal: Strength & Muscle Tone: within normal limits Gait & Station: normal Patient leans: N/A  Psychiatric Specialty Exam: Review of Systems  HENT: Negative.   Respiratory: Negative.   Cardiovascular: Negative.   Musculoskeletal: Negative.  Skin: Negative.   Neurological: Negative.   Psychiatric/Behavioral: Positive for substance abuse. The patient is nervous/anxious and has insomnia.     Blood pressure 120/68, pulse 73, height _0  (1.676 m), weight 153 lb 12.8 oz (69.8 kg).Body mass index is 24.82 kg/m.  General Appearance: Well Groomed  Eye Contact:  Good  Speech:  Clear and Coherent  Volume:  Normal  Mood:  Anxious  Affect:  Appropriate  Thought Process:  Goal Directed  Orientation:  Full (Time, Place, and Person)  Thought Content:  Logical and Rumination  Suicidal Thoughts:  No  Homicidal Thoughts:  No  Memory:  Immediate;   Good Recent;   Good Remote;   Good  Judgement:  Fair  Insight:  Good  Psychomotor Activity:  Normal  Concentration:  Concentration: Good and Attention Span: Good  Recall:  Good  Fund of Knowledge:Good  Language: Good  Akathisia:  No  Handed:  Right  AIMS (if indicated):  0  Assets:  Communication Skills Desire for [place] Talents/Skills Transportation  ADL's:  Intact  Cognition: WNL  Sleep:  fair   Assessment: Alcohol abuse.  Rule out generalized anxiety disorder.  Plan: I review her  symptoms, history, current medication, recent blood work results and psychosocial stressors.  I talked in detail about treatment option for her alcohol.  I provided information about [facility] IOP and patient will think about it and let us know.  I will try naltrexone 50 mg daily.  We discussed medication side effects in detail.  I also encourage if she is not interested in CD IOP then she should consider individual counseling.  We also talked about AA meetings and patient will discuss all these options.  Recommended to call us back if she has any question or any concern.  Discussed safety concerns at any time having active suicidal thoughts or homicidal thought that she need to call 911 or go to local emergency room.  Follow-up in 3 weeks.  Kathlee Nations, MD 1/31/20199:06 AM

## 2017-04-07 NOTE — Progress Notes (Signed)
Patient ID: Samantha Golden, female   DOB: 1965/09/08, 52 y.o.   MRN: [id number]   HPI  Samantha Golden is a 52 y.o.-year-old female, initially referred by Dr Phineas Real, returning for f/u for uncontrolled Hashimoto's hypothyroidism. Last visit 7 mo ago.  Reviewed and addended hx: Pt. has been dx with hypothyroidism in ~1990.   Previously on Synthroid DAW 225 mcg >> 300 mcg >> ... >>  Was on 120 mg in 09/2016  Pt is on Armour 90 alt. With 120 mg qod (dose decreased from 120 mg daily 09/2016, then went back to 120 mg daily as she felt better on this), taken: - in am - fasting - no b'fast - no Ca, Fe, MVI, PPIs - not on Biotin  I reviewed pt's thyroid tests - last TSH suppressed: Lab Results  Component Value Date   TSH 0.174 (L) 09/15/2016   TSH 0.61 04/08/2016   TSH 0.05 (L) 05/12/2015   TSH 0.08 (L) 03/26/2015   TSH 4.84 (H) 09/03/2014   TSH 0.18 (L) 05/13/2014   TSH 58.73 (H) 04/08/2014   TSH 0.08 (L) 12/06/2013   TSH 130.618 (H) 10/23/2013   TSH 90.174 (H) 03/10/2012   FREET4 1.04 09/15/2016   FREET4 1.08 03/26/2015   FREET4 0.39 (L) 09/03/2014   FREET4 0.64 05/13/2014   FREET4 0.84 04/08/2014   FREET4 1.75 (H) 12/06/2013  TSH 4.26 in 2012. She has been out of the LT4 for ~ 1 mo before the test in 10/2013.  Positive TPO Abs: Component     Latest Ref Rng & Units 10/23/2013  Thyroperoxidase Ab SerPl-aCnc     <35.0 IU/mL 265.0 (H)  Thyroglobulin Ab     <40.0 IU/mL 38.2   She got palpitations when she took Adderall with the LT4 300 mcg.  Pt denies: - feeling nodules in neck - hoarseness - dysphagia - choking - SOB with lying down  ROS: Constitutional: + weight gain (not by our scale)/no weight loss, no fatigue, no subjective hyperthermia, no subjective hypothermia Eyes: no blurry vision, no xerophthalmia ENT: no sore throat, + see HPI Cardiovascular: no CP/no SOB/no palpitations/no leg swelling Respiratory: no cough/no SOB/no wheezing Gastrointestinal: no N/no  V/+ D/+ C/no acid reflux Musculoskeletal: no muscle aches/no joint aches Skin: no rashes, no hair loss Neurological: no tremors/no numbness/no tingling/no dizziness  I reviewed pt's medications, allergies, PMH, social hx, family hx, and changes were documented in the history of present illness. Otherwise, unchanged from my initial visit note.  Past Medical History:  Diagnosis Date  . Abnormal cervical Papanicolaou smear    ? LASER TREATMENT   . Allergy   . BRCA negative 08/09  . Cervical dysplasia    Past Surgical History:  Procedure Laterality Date  . ADENOIDECTOMY    . CERVIX LESION DESTRUCTION    . COLPOSCOPY    . INTRAUTERINE DEVICE (IUD) INSERTION  09/04/2014   Mirena   History   Social History  . Marital Status: Divorced    Spouse Name: N/A    Number of Children: 3   Occupational History  . teacher   Social History Main Topics  . Smoking status: Former Research scientist (life sciences)  . Smokeless tobacco: Never Used  . Alcohol Use: 1.0 oz/week    2 drink(s) per week  . Drug Use: No  . Sexual Activity: Yes    Birth Control/ Protection: IUD     Comment: ParaGard 08/2012   Current Outpatient Medications on File Prior to Visit  Medication Sig Dispense Refill  .  Albuterol Sulfate (PROAIR RESPICLICK) 258 (90 Base) MCG/ACT AEPB Inhale 1 Inhaler every 4 (four) hours as needed into the lungs. 1 each 0  . ARMOUR THYROID 120 MG tablet TAKE 1 TABLET BEFORE BREAKFAST every other day 45 tablet 12  . estradiol (ESTRACE) 1 MG tablet TAKE 1 TABLET ONCE DAILY. (Patient not taking: Reported on 04/07/2017) 30 tablet 6  . fluticasone (FLOVENT HFA) 44 MCG/ACT inhaler Inhale 2 puffs 2 (two) times daily into the lungs. 1 Inhaler 12   Current Facility-Administered Medications on File Prior to Visit  Medication Dose Route Frequency Provider Last Rate Last Dose  . levonorgestrel (MIRENA) 20 MCG/24HR IUD   Intrauterine Once Fontaine, Belinda Block, MD       Allergies  Allergen Reactions  . Augmentin  [Amoxicillin-Pot Clavulanate]   . Other     "CLEANING MATERIALS"   Family History  Problem Relation Age of Onset  . Breast cancer Mother        Age 58  . Breast cancer Maternal Grandmother        Age 60's  . Diabetes Maternal Grandfather   . Alcohol abuse Brother   . Cancer Paternal Aunt        Cervical  . Diabetes Paternal Uncle   . Breast cancer Paternal Aunt        Age 41's   PE: BP 100/64   Pulse 75   Ht '5\' 6"'$  (1.676 m)   Wt 151 lb 12.8 oz (68.9 kg)   SpO2 97%   BMI 24.50 kg/m  Body mass index is 24.5 kg/m.  Wt Readings from Last 3 Encounters:  04/07/17 151 lb 12.8 oz (68.9 kg)  01/17/17 152 lb 9.6 oz (69.2 kg)  10/05/16 156 lb (70.8 kg)   Constitutional: normal weight, in NAD Eyes: PERRLA, EOMI, no exophthalmos ENT: moist mucous membranes, no thyromegaly, no cervical lymphadenopathy Cardiovascular: RRR, No MRG Respiratory: CTA B Gastrointestinal: abdomen soft, NT, ND, BS+ Musculoskeletal: no deformities, strength intact in all 4 Skin: moist, warm, no rashes Neurological: no tremor with outstretched hands, DTR normal in all 4  ASSESSMENT: 1. Hashimoto's Hypothyroidism - did not feel good on Synthroid  2.  Perimenopause  PLAN:  1. Patient with long-standing, uncontrolled, Hashimoto's hypothyroidism, now on Armour Thyroid.  She feels better on Armour, but still has many sxs that are nonspecific and may be related to approacing menopause  - latest thyroid labs reviewed with pt >> TSH low >> at last visit, I advised her to decrease the Armour dose to 90 alternating with 120 mg qod.  She did this for a short time but Went back to 120 mg daily that she felt better on this dose... - At this visit, we discussed about potential side effects of taking too much thyroid medication: In the short run: Palpitations, tremors, anxiety, heat intolerance, insomnia, weight loss; but in the long run: Arrhythmia, increased risk of clots, etc. I strongly suggested to use a dose of  Armour that allows [place] to get her thyroid tests into the normal range, but not lower.  She reluctantly agrees. - we discussed about taking the thyroid hormone every day, with water, >30 minutes before breakfast, separated by >4 hours from acid reflux medications, calcium, iron, multivitamins. Pt. is taking it correctly. - will check thyroid tests today: TSH, fT3, and fT4 - If labs are abnormal, she will need to return for repeat TFTs in 1.5 months - OTW, RTC in 6 mo.  2. Perimenopause - We discussed about  the definition of menopause: FSH higher than 23 and absent menstrual cycles for a year.  It is difficult for [place] to assess menstrual cycles since she does not have them while on Mirena IUD. - we discussed about the average age of menopause and [place]: 50 years old - We also discussed about symptoms of menopause - Per her request, we will check the following labs: Orders Placed This Encounter  Procedures  . Follicle stimulating hormone  . Luteinizing hormone   Component     Latest Ref Rng & Units 04/07/2017  TSH     0.35 - 4.50 uIU/mL 0.03 (L)  T4,Free(Direct)     0.60 - 1.60 ng/dL 0.92  Triiodothyronine,Free,Serum     2.3 - 4.2 pg/mL 4.4 (H)  FSH     mIU/ML 92.2  LH     mIU/mL 54.83  FSH and LH in menopausal range. TSH suppressed >> decrease dose to Armour 90 alternating with 120 mg qod.  Philemon Kingdom, MD PhD [facility] Endocrinology

## 2017-04-07 NOTE — Patient Instructions (Addendum)
Please stop at the lab.  Please continue Armour 120 mg every day.  Take the thyroid hormone every day, with water, at least 30 minutes before breakfast, separated by at least 4 hours from: - acid reflux medications - calcium - iron - multivitamins  Please return in 6 months with your sugar log.

## 2017-04-08 ENCOUNTER — Other Ambulatory Visit: Payer: Self-pay | Admitting: Internal Medicine

## 2017-04-08 DIAGNOSIS — E038 Other specified hypothyroidism: Secondary | ICD-10-CM

## 2017-04-08 DIAGNOSIS — E063 Autoimmune thyroiditis: Principal | ICD-10-CM

## 2017-04-08 LAB — FOLLICLE STIMULATING HORMONE: FSH: 92.2 m[IU]/mL

## 2017-04-08 LAB — LUTEINIZING HORMONE: LH: 54.83 m[IU]/mL

## 2017-04-08 MED ORDER — ARMOUR THYROID 90 MG PO TABS: 90.0000 mg | ORAL_TABLET | ORAL | 3 refills | Status: DC

## 2017-04-19 ENCOUNTER — Telehealth (HOSPITAL_COMMUNITY): Payer: Self-pay

## 2017-04-19 ENCOUNTER — Other Ambulatory Visit (HOSPITAL_COMMUNITY): Payer: Self-pay | Admitting: Psychiatry

## 2017-04-19 NOTE — Telephone Encounter (Signed)
Patient is calling to see if she can increase the naltrexone. She is currently on 50 mg. She is still having cravings.

## 2017-04-19 NOTE — Telephone Encounter (Signed)
Called patient and let her know and advised her to stay on this dose and to attend an [place] meeting everyday.

## 2017-04-19 NOTE — Progress Notes (Signed)
She should stay on her current dose until her next appointment.

## 2017-04-19 NOTE — Telephone Encounter (Signed)
She should stay on her current dose until her next appointment.

## 2017-05-02 ENCOUNTER — Ambulatory Visit (INDEPENDENT_AMBULATORY_CARE_PROVIDER_SITE_OTHER): Payer: BC Managed Care – PPO | Admitting: Psychiatry

## 2017-05-02 ENCOUNTER — Encounter (HOSPITAL_COMMUNITY): Payer: Self-pay | Admitting: Psychiatry

## 2017-05-02 VITALS — BP 126/74 | HR 97 | Ht 66.0 in | Wt 154.0 lb

## 2017-05-02 DIAGNOSIS — F1010 Alcohol abuse, uncomplicated: Secondary | ICD-10-CM

## 2017-05-02 DIAGNOSIS — Z87891 Personal history of nicotine dependence: Secondary | ICD-10-CM | POA: Diagnosis not present

## 2017-05-02 DIAGNOSIS — Z975 Presence of (intrauterine) contraceptive device: Secondary | ICD-10-CM | POA: Diagnosis not present

## 2017-05-02 DIAGNOSIS — R450 Nervousness: Secondary | ICD-10-CM | POA: Diagnosis not present

## 2017-05-02 DIAGNOSIS — F411 Generalized anxiety disorder: Secondary | ICD-10-CM | POA: Diagnosis not present

## 2017-05-02 DIAGNOSIS — Z811 Family history of alcohol abuse and dependence: Secondary | ICD-10-CM | POA: Diagnosis not present

## 2017-05-02 MED ORDER — FLUOXETINE HCL 10 MG PO CAPS: 10.0000 mg | ORAL_CAPSULE | Freq: Every day | ORAL | 2 refills | Status: DC

## 2017-05-02 MED ORDER — NALTREXONE HCL 50 MG PO TABS: 100.0000 mg | ORAL_TABLET | Freq: Every day | ORAL | 0 refills | Status: DC

## 2017-05-02 NOTE — Progress Notes (Signed)
[facility] MD/PA/NP OP Progress Note  05/02/2017 2:17 PM Samantha Golden  MRN:  [id number]  Chief Complaint: I like naltrexone.  I cut down my drinking but is still drink 2-3 shots every day.  I think I need to increase my naltrexone.  HPI: Samantha Golden came for her follow-up appointment.  She is a 52 year old Caucasian, employed divorced female who was seen first time seeking help to stop drinking.  Patient also endorsed anxiety and nervousness.  She was drinking 5-6 liquor every day.  We started her on naltrexone 50 mg.  She admitted significant cut down but continues to drink 2-3 shots every day.  She admitted most of the time is due to boredom or feeling anxious.  She worried about her future and her health.  Patient denies any blackouts, intoxication, seizures or any withdrawal symptoms.  Patient has Hashimoto's thyroiditis.  Recently she seen endocrinologist.  She also reported hot flashes, nervousness, sweating and nightmares.  We recommended FSH and LH levels and she had a blood work which shows higher levels.  Patient has a good relationship with her boyfriend who stopped drinking due to severe pancreatitis.  Patient has 3 children who were out grown and lives out of the town.  Patient denies any mania, psychosis, hallucination or any suicidal thoughts.  Her energy level is good.  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  Patient denies any illegal substance use.  Visit Diagnosis:    ICD-10-CM   1. Generalized anxiety disorder F41.1 FLUoxetine (PROZAC) 10 MG capsule  2. ETOH abuse F10.10 naltrexone (DEPADE) 50 MG tablet    Past Psychiatric History: Reviewed. Patient denies any history of psychiatric inpatient treatment, suicidal attempt, mania, psychosis or any hallucination.  She started drinking alcohol since 2010.  In the beginning she was drinking to give company to her boyfriend but then she started regularly every day.  She never had DUI or any legal consequences.  She never had withdrawal symptoms.   Patient denies any blackouts, seizures or any tremors.  Past Medical History:  Past Medical History:  Diagnosis Date  . Abnormal cervical Papanicolaou smear    ? LASER TREATMENT   . Allergy   . BRCA negative 08/09  . Cervical dysplasia     Past Surgical History:  Procedure Laterality Date  . ADENOIDECTOMY    . CERVIX LESION DESTRUCTION    . COLPOSCOPY    . INTRAUTERINE DEVICE (IUD) INSERTION  09/04/2014   Mirena    Family Psychiatric History: Reviewed.  Family History:  Family History  Problem Relation Age of Onset  . Breast cancer Mother        Age 38  . Breast cancer Maternal Grandmother        Age 50's  . Diabetes Maternal Grandfather   . Alcohol abuse Brother   . Cancer Paternal Aunt        Cervical  . Diabetes Paternal Uncle   . Breast cancer Paternal Aunt        Age 98's    Social History:  Social History   Socioeconomic History  . Marital status: Divorced    Spouse name: None  . Number of children: None  . Years of education: None  . Highest education level: None  Social Needs  . Financial resource strain: None  . Food insecurity - worry: None  . Food insecurity - inability: None  . Transportation needs - medical: None  . Transportation needs - non-medical: None  Occupational History  .  None  Tobacco Use  . Smoking status: Former Smoker    Years: 10.00    Types: Cigarettes  . Smokeless tobacco: Never Used  Substance and Sexual Activity  . Alcohol use: Yes    Frequency: Never    Comment: 4-6 a day  . Drug use: No  . Sexual activity: Yes    Birth control/protection: IUD    Comment: MIRENA 09/04/2014  Other Topics Concern  . None  Social History Narrative  . None    Allergies:  Allergies  Allergen Reactions  . Augmentin [Amoxicillin-Pot Clavulanate]   . Other     "CLEANING MATERIALS"    Metabolic Disorder Labs: Recent Results (from the past 3546 hour(s))  Follicle stimulating hormone     Status: None   Collection Time: 04/07/17   3:53 PM  Result Value Ref Range   FSH 92.2 mIU/ML    Comment: Female Reference Range:  1.4-18.1 mIU/mLFemale Reference Range:Follicular Phase          2.5-10.2 mIU/mLMidCycle Peak          3.4-33.4 mIU/mLLuteal Phase          1.5-9.1 mIU/mLPost Menopausal     23.0-116.3 mIU/mLPregnant          <0.3 mIU/mL  Luteinizing hormone     Status: None   Collection Time: 04/07/17  3:53 PM  Result Value Ref Range   LH 54.83 mIU/mL    Comment: Female Reference Range:20-70 yrs     1.5-9.3 mIU/mL>70 yrs       3.1-35.6 mIU/mLFemale Reference Range:Follicular Phase     5.6-81.2 mIU/mLMidcycle             8.7-76.3 mIU/mLLuteal Phase         0.5-16.9 mIU/mL  Post Menopausal      15.9-54.0  mIU/mLPregnant             <1.5 mIU/mLContraceptives       0.7-5.6 mIU/mL   TSH     Status: Abnormal   Collection Time: 04/07/17  3:53 PM  Result Value Ref Range   TSH 0.03 (L) 0.35 - 4.50 uIU/mL  T4, free     Status: None   Collection Time: 04/07/17  3:53 PM  Result Value Ref Range   Free T4 0.92 0.60 - 1.60 ng/dL    Comment: Specimens from patients who are undergoing biotin therapy and /or ingesting biotin supplements may contain high levels of biotin.  The higher biotin concentration in these specimens interferes with this Free T4 assay.  Specimens that contain high levels  of biotin may cause false high results for this Free T4 assay.  Please interpret results in light of the total clinical presentation of the patient.    T3, free     Status: Abnormal   Collection Time: 04/07/17  3:53 PM  Result Value Ref Range   T3, Free 4.4 (H) 2.3 - 4.2 pg/mL   No results found for: HGBA1C, MPG No results found for: PROLACTIN Lab Results  Component Value Date   CHOL 194 04/08/2016   TRIG 87 04/08/2016   HDL 65 04/08/2016   CHOLHDL 3.0 04/08/2016   VLDL 17 04/08/2016   LDLCALC 112 (H) 04/08/2016   LDLCALC 113 03/27/2015   Lab Results  Component Value Date   TSH 0.03 (L) 04/07/2017   TSH 0.174 (L) 09/15/2016     Therapeutic Level Labs: No results found for: LITHIUM No results found for: VALPROATE No components found for:  CBMZ  Current Medications:  Current Outpatient Medications  Medication Sig Dispense Refill  . Albuterol Sulfate (PROAIR RESPICLICK) 580 (90 Base) MCG/ACT AEPB Inhale 1 Inhaler every 4 (four) hours as needed into the lungs. 1 each 0  . ARMOUR THYROID 120 MG tablet TAKE 1 TABLET BEFORE BREAKFAST every other day 45 tablet 12  . ARMOUR THYROID 90 MG tablet Take 1 tablet (90 mg total) by mouth every other day. 45 tablet 3  . estradiol (ESTRACE) 1 MG tablet TAKE 1 TABLET ONCE DAILY. 30 tablet 6  . fluticasone (FLOVENT HFA) 44 MCG/ACT inhaler Inhale 2 puffs 2 (two) times daily into the lungs. 1 Inhaler 12  . naltrexone (DEPADE) 50 MG tablet Take 1 tablet (50 mg total) by mouth daily. 30 tablet 0   Current Facility-Administered Medications  Medication Dose Route Frequency Provider Last Rate Last Dose  . levonorgestrel (MIRENA) 20 MCG/24HR IUD   Intrauterine Once Fontaine, Belinda Block, MD         Musculoskeletal: Strength & Muscle Tone: within normal limits Gait & Station: normal Patient leans: N/A  Psychiatric Specialty Exam: Review of Systems  Constitutional: Negative.   HENT: Negative.   Respiratory: Negative.   Cardiovascular: Negative.   Skin: Negative.   Neurological: Negative.   Endo/Heme/Allergies:       Hot flashes.  Psychiatric/Behavioral: The patient is nervous/anxious.        Bad dreams    Blood pressure 126/74, pulse 97, height _0  (1.676 m), weight 154 lb (69.9 kg).Body mass index is 24.86 kg/m.  General Appearance: Well Groomed  Eye Contact:  Good  Speech:  Clear and Coherent  Volume:  Normal  Mood:  Anxious  Affect:  Appropriate  Thought Process:  Goal Directed  Orientation:  Full (Time, Place, and Person)  Thought Content: Rumination   Suicidal Thoughts:  No  Homicidal Thoughts:  No  Memory:  Immediate;   Good Recent;   Good Remote;   Good   Judgement:  Good  Insight:  Fair  Psychomotor Activity:  Normal  Concentration:  Concentration: Good and Attention Span: Good  Recall:  Good  Fund of Knowledge: Good  Language: Good  Akathisia:  No  Handed:  Right  AIMS (if indicated): not done  Assets:  Communication Skills Desire for [place] Talents/Skills Transportation  ADL's:  Intact  Cognition: WNL  Sleep:  Good   Screenings: PHQ2-9     Office Visit from 01/17/2017 in [facility] at [place] from 09/15/2016 in [place] at [place] from 01/02/2016 in Primary Care at [place] from 05/12/2015 in [place] at [place] from 05/29/2014 in Primary Care at [facility] Total Score  0  0  0  1  0  PHQ-9 Total Score  No data  No data  No data  6  No data       Assessment and Plan: Alcohol abuse.  Generalized anxiety disorder.  Patient doing better since we started naltrexone however she insists that she need to try higher doses because she continues to have craving especially in the evening.  She also noticed anxiety and nervousness.  I review her blood work results and progress note from her endocrinologist.  Her [place] and LH are high.  Patient does not want hormonal replacement therapy for her menopause.  We also discussed CD IOP and individual counseling but due to her busy schedule she is not interested at this time but will consider in the future  if needed.  I recommended to try low-dose Prozac to help her anxiety symptoms which can also help her menopausal symptoms.  We will try naltrexone 50 mg twice a day however we discussed that is a high dose and should monitor closely for having any side effects.  At this time patient does not have any tremors, shakes or any EPS.  Her last liver enzymes were done in July 2018 with mild elevation of AST and ALT.  We will consider blood work on her next appointment.  Discussed safety concern especially if she  had any suicidal thoughts or homicidal thought and she need to call 911 or go to local emergency room.  Patient is feeling better since the last visit.  Reassurance given.  Encourage to attend AA meeting when she had a time.  I will see her again in 4-6 weeks.  Time spent 25 minutes.  More than 50% of the time spent in psychoeducation, counseling, coronation of care and long-term prognosis of alcohol use.   Kathlee Nations, MD 05/02/2017, 2:17 PM

## 2017-06-13 ENCOUNTER — Ambulatory Visit (HOSPITAL_COMMUNITY): Payer: BC Managed Care – PPO | Admitting: Psychiatry

## 2017-07-18 ENCOUNTER — Other Ambulatory Visit: Payer: Self-pay | Admitting: Women's Health

## 2017-07-18 DIAGNOSIS — E038 Other specified hypothyroidism: Secondary | ICD-10-CM

## 2017-07-19 NOTE — Telephone Encounter (Signed)
CE is scheduled for 07/27/17.  Last CE 04/02/2016.

## 2017-07-27 ENCOUNTER — Encounter: Payer: BC Managed Care – PPO | Admitting: Women's Health

## 2017-08-31 ENCOUNTER — Ambulatory Visit: Payer: BC Managed Care – PPO | Admitting: Physician Assistant

## 2017-08-31 ENCOUNTER — Encounter: Payer: Self-pay | Admitting: Physician Assistant

## 2017-08-31 ENCOUNTER — Other Ambulatory Visit: Payer: Self-pay

## 2017-08-31 VITALS — BP 110/68 | HR 82 | Temp 98.9°F | Resp 20 | Ht 67.01 in | Wt 149.2 lb

## 2017-08-31 DIAGNOSIS — R748 Abnormal levels of other serum enzymes: Secondary | ICD-10-CM

## 2017-08-31 DIAGNOSIS — R1013 Epigastric pain: Secondary | ICD-10-CM | POA: Diagnosis not present

## 2017-08-31 DIAGNOSIS — F1010 Alcohol abuse, uncomplicated: Secondary | ICD-10-CM | POA: Diagnosis not present

## 2017-08-31 DIAGNOSIS — G8929 Other chronic pain: Secondary | ICD-10-CM | POA: Diagnosis not present

## 2017-08-31 DIAGNOSIS — M545 Low back pain, unspecified: Secondary | ICD-10-CM

## 2017-08-31 MED ORDER — NAPROXEN 500 MG PO TABS: 500.0000 mg | ORAL_TABLET | Freq: Two times a day (BID) | ORAL | 0 refills | Status: DC
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
## 2017-08-31 NOTE — Patient Instructions (Addendum)
Your back pain is  Consistent with muscular pain.  I suggest massage therapy at least 1-2 times per month.  Daily stretching.  Avoid heavy lifting and poor posture.  Apply heating pad to the affected area at least 4-5 times a day for 20 minutes at a time.  You may use anti-inflammatories such as prescribed.  However if this upsets her stomach, please discontinue.  We have checked your liver and kidney today.  I will contact you with these results.  NSAIDs like naproxen have common side effects of heartburn, stomach pain, indigestion, and headache. Could lead to renal insufficiency, stroke, or GI bleed if taken excess amounts outside of what is recommended on label long term.   Please perform exercises below. Stretches are to be performed for 2 sets, holding 10-15 seconds each. Recommended to perform this rehab twice daily within pain tolerance for 2 weeks.     I have placed a referral for GI and he should hear from them In 1 to 2 weeks. You may want to consider a trial of Zantac twice daily for 2 weeks just to see if this improves any of your symptoms.   FLEXION RANGE OF MOTION AND STRETCHING EXERCISES: STRETCH - Flexion, Single Knee to Chest   Lie on a firm bed or floor with both legs extended in front of you.  Keeping one leg in contact with the floor, bring your opposite knee to your chest. Hold your leg in place by either grabbing behind your thigh or at your knee.  Pull until you feel a gentle stretch in your lower back.   Slowly release your grasp and repeat the exercise with the opposite side.  STRETCH - Flexion, Double Knee to Chest   Lie on a firm bed or floor with both legs extended in front of you.  Keeping one leg in contact with the floor, bring your opposite knee to your chest.  Tense your stomach muscles to support your back and then lift your other knee to your chest. Hold your legs in place by either grabbing behind your thighs or at your knees.  Pull both knees toward  your chest until you feel a gentle stretch in your lower back.   Tense your stomach muscles and slowly return one leg at a time to the floor.  STRETCH - Low Trunk Rotation  Lie on a firm bed or floor. Keeping your legs in front of you, bend your knees so they are both pointed toward the ceiling and your feet are flat on the floor.  Extend your arms out to the side. This will stabilize your upper body by keeping your shoulders in contact with the floor.  Gently and slowly drop both knees together to one side until you feel a gentle stretch in your lower back.   Tense your stomach muscles to support your lower back as you bring your knees back to the starting position. Repeat the exercise to the other side.   EXTENSION RANGE OF MOTION AND FLEXIBILITY EXERCISES: STRETCH - Extension, Prone on Elbows   Lie on your stomach on the floor, a bed will be too soft. Place your palms about shoulder width apart and at the height of your head.  Place your elbows under your shoulders. If this is too painful, stack pillows under your chest.  Allow your body to relax so that your hips drop lower and make contact more completely with the floor.  Slowly return to lying flat on the floor.  RANGE OF MOTION - Extension, Prone Press Ups  Lie on your stomach on the floor, a bed will be too soft. Place your palms about shoulder width apart and at the height of your head.  Keeping your back as relaxed as possible, slowly straighten your elbows while keeping your hips on the floor. You may adjust the placement of your hands to maximize your comfort. As you gain motion, your hands will come more underneath your shoulders.  Slowly return to lying flat on the floor.  RANGE OF MOTION- Quadruped, Neutral Spine   Assume a hands and knees position on a firm surface. Keep your hands under your shoulders and your knees under your hips. You may place padding under your knees for comfort.  Drop your head and point  your tail bone toward the ground below you. This will round out your lower back like an angry cat.    Slowly lift your head and release your tail bone so that your back sags into a large arch, like an old horse.  Repeat this until you feel limber in your lower back.  Now, find your "sweet spot." This will be the most comfortable position somewhere between the two previous positions. This is your neutral spine. Once you have found this position, tense your stomach muscles to support your lower back.  STRENGTHENING EXERCISES - Low Back Strain These exercises may help you when beginning to rehabilitate your injury. These exercises should be done near your "sweet spot." This is the neutral, low-back arch, somewhere between fully rounded and fully arched, that is your least painful position. When performed in this safe range of motion, these exercises can be used for people who have either a flexion or extension based injury. These exercises may resolve your symptoms with or without further involvement from your physician, physical therapist or athletic trainer. While completing these exercises, remember:   Muscles can gain both the endurance and the strength needed for everyday activities through controlled exercises.  Complete these exercises as instructed by your physician, physical therapist or athletic trainer. Increase the resistance and repetitions only as guided.  You may experience muscle soreness or fatigue, but the pain or discomfort you are trying to eliminate should never worsen during these exercises. If this pain does worsen, stop and make certain you are following the directions exactly. If the pain is still present after adjustments, discontinue the exercise until you can discuss the trouble with your caregiver.  STRENGTHENING - Deep Abdominals, Pelvic Tilt  Lie on a firm bed or floor. Keeping your legs in front of you, bend your knees so they are both pointed toward the ceiling and  your feet are flat on the floor.  Tense your lower abdominal muscles to press your lower back into the floor. This motion will rotate your pelvis so that your tail bone is scooping upwards rather than pointing at your feet or into the floor.  STRENGTHENING - Abdominals, Crunches   Lie on a firm bed or floor. Keeping your legs in front of you, bend your knees so they are both pointed toward the ceiling and your feet are flat on the floor. Cross your arms over your chest.  Slightly tip your chin down without bending your neck.  Tense your abdominals and slowly lift your trunk high enough to just clear your shoulder blades. Lifting higher can put excessive stress on the lower back and does not further strengthen your abdominal muscles.  Control your return to the starting  position.  STRENGTHENING - Quadruped, Opposite UE/LE Lift   Assume a hands and knees position on a firm surface. Keep your hands under your shoulders and your knees under your hips. You may place padding under your knees for comfort.  Find your neutral spine and gently tense your abdominal muscles so that you can maintain this position. Your shoulders and hips should form a rectangle that is parallel with the floor and is not twisted.  Keeping your trunk steady, lift your right hand no higher than your shoulder and then your left leg no higher than your hip. Make sure you are not holding your breath.   Continuing to keep your abdominal muscles tense and your back steady, slowly return to your starting position. Repeat with the opposite arm and leg.  STRENGTHENING - Lower Abdominals, Double Knee Lift  Lie on a firm bed or floor. Keeping your legs in front of you, bend your knees so they are both pointed toward the ceiling and your feet are flat on the floor.  Tense your abdominal muscles to brace your lower back and slowly lift both of your knees until they come over your hips. Be certain not to hold your breath.  POSTURE  AND BODY MECHANICS CONSIDERATIONS - Low Back Strain Keeping correct posture when sitting, standing or completing your activities will reduce the stress put on different body tissues, allowing injured tissues a chance to heal and limiting painful experiences. The following are general guidelines for improved posture. Your physician or physical therapist will provide you with any instructions specific to your needs. While reading these guidelines, remember:  The exercises prescribed by your provider will help you have the flexibility and strength to maintain correct postures.  The correct posture provides the best environment for your joints to work. All of your joints have less wear and tear when properly supported by a spine with good posture. This means you will experience a healthier, less painful body.  Correct posture must be practiced with all of your activities, especially prolonged sitting and standing. Correct posture is as important when doing repetitive low-stress activities (typing) as it is when doing a single heavy-load activity (lifting). RESTING POSITIONS Consider which positions are most painful for you when choosing a resting position. If you have pain with flexion-based activities (sitting, bending, stooping, squatting), choose a position that allows you to rest in a less flexed posture. You would want to avoid curling into a fetal position on your side. If your pain worsens with extension-based activities (prolonged standing, working overhead), avoid resting in an extended position such as sleeping on your stomach. Most people will find more comfort when they rest with their spine in a more neutral position, neither too rounded nor too arched. Lying on a non-sagging bed on your side with a pillow between your knees, or on your back with a pillow under your knees will often provide some relief. Keep in mind, being in any one position for a prolonged period of time, no matter how correct  your posture, can still lead to stiffness. PROPER SITTING POSTURE In order to minimize stress and discomfort on your spine, you must sit with correct posture. Sitting with good posture should be effortless for a healthy body. Returning to good posture is a gradual process. Many people can work toward this most comfortably by using various supports until they have the flexibility and strength to maintain this posture on their own. When sitting with proper posture, your ears will fall over  your shoulders and your shoulders will fall over your hips. You should use the back of the chair to support your upper back. Your lower back will be in a neutral position, just slightly arched. You may place a small pillow or folded towel at the base of your lower back for support.  When working at a desk, create an environment that supports good, upright posture. Without extra support, muscles tire, which leads to excessive strain on joints and other tissues. Keep these recommendations in mind: CHAIR:  A chair should be able to slide under your desk when your back makes contact with the back of the chair. This allows you to work closely.  The chair's height should allow your eyes to be level with the upper part of your monitor and your hands to be slightly lower than your elbows. BODY POSITION  Your feet should make contact with the floor. If this is not possible, use a foot rest.  Keep your ears over your shoulders. This will reduce stress on your neck and lower back. INCORRECT SITTING POSTURES  If you are feeling tired and unable to assume a healthy sitting posture, do not slouch or slump. This puts excessive strain on your back tissues, causing more damage and pain. Healthier options include:  Using more support, like a lumbar pillow.  Switching tasks to something that requires you to be upright or walking.  Talking a brief walk.  Lying down to rest in a neutral-spine position. PROLONGED STANDING WHILE  SLIGHTLY LEANING FORWARD  When completing a task that requires you to lean forward while standing in one place for a long time, place either foot up on a stationary 2-4 inch high object to help maintain the best posture. When both feet are on the ground, the lower back tends to lose its slight inward curve. If this curve flattens (or becomes too large), then the back and your other joints will experience too much stress, tire more quickly, and can cause pain. CORRECT STANDING POSTURES Proper standing posture should be assumed with all daily activities, even if they only take a few moments, like when brushing your teeth. As in sitting, your ears should fall over your shoulders and your shoulders should fall over your hips. You should keep a slight tension in your abdominal muscles to brace your spine. Your tailbone should point down to the ground, not behind your body, resulting in an over-extended swayback posture.  INCORRECT STANDING POSTURES  Common incorrect standing postures include a forward head, locked knees and/or an excessive swayback. WALKING Walk with an upright posture. Your ears, shoulders and hips should all line-up. PROLONGED ACTIVITY IN A FLEXED POSITION When completing a task that requires you to bend forward at your waist or lean over a low surface, try to find a way to stabilize 3 out of 4 of your limbs. You can place a hand or elbow on your thigh or rest a knee on the surface you are reaching across. This will provide you more stability so that your muscles do not fatigue as quickly. By keeping your knees relaxed, or slightly bent, you will also reduce stress across your lower back. CORRECT LIFTING TECHNIQUES DO :   Assume a wide stance. This will provide you more stability and the opportunity to get as close as possible to the object which you are lifting.  Tense your abdominals to brace your spine. Bend at the knees and hips. Keeping your back locked in a neutral-spine position,  lift  using your leg muscles. Lift with your legs, keeping your back straight.  Test the weight of unknown objects before attempting to lift them.  Try to keep your elbows locked down at your sides in order get the best strength from your shoulders when carrying an object.  Always ask for help when lifting heavy or awkward objects. INCORRECT LIFTING TECHNIQUES DO NOT:   Lock your knees when lifting, even if it is a small object.  Bend and twist. Pivot at your feet or move your feet when needing to change directions.  Assume that you can safely pick up even a paper clip without proper posture.      IF you received an x-ray today, you will receive an invoice from [facility]. Please contact [facility] at [phone] with questions or concerns regarding your invoice.   IF you received labwork today, you will receive an invoice from [facility]. Please contact [facility] at [phone] with questions or concerns regarding your invoice.   Our billing staff will not be able to assist you with questions regarding bills from these companies.  You will be contacted with the lab results as soon as they are available. The fastest way to get your results is to activate your My Chart account. Instructions are located on the last page of this paperwork. If you have not heard from [place] regarding the results in 2 weeks, please contact this office.

## 2017-09-01 ENCOUNTER — Encounter: Payer: Self-pay | Admitting: Physician Assistant

## 2017-09-01 LAB — CMP14+EGFR
ALBUMIN: 4.4 g/dL (ref 3.5–5.5)
ALT: 34 IU/L — ABNORMAL HIGH (ref 0–32)
AST: 32 IU/L (ref 0–40)
Albumin/Globulin Ratio: 1.8 (ref 1.2–2.2)
Alkaline Phosphatase: 54 IU/L (ref 39–117)
BUN / CREAT RATIO: 20 (ref 9–23)
BUN: 10 mg/dL (ref 6–24)
Bilirubin Total: 0.4 mg/dL (ref 0.0–1.2)
CALCIUM: 10 mg/dL (ref 8.7–10.2)
CO2: 23 mmol/L (ref 20–29)
CREATININE: 0.51 mg/dL — AB (ref 0.57–1.00)
Chloride: 102 mmol/L (ref 96–106)
GFR, EST AFRICAN AMERICAN: 131 mL/min/{1.73_m2} (ref >59)
GFR, EST NON AFRICAN AMERICAN: 113 mL/min/{1.73_m2} (ref >59)
GLOBULIN, TOTAL: 2.5 g/dL (ref 1.5–4.5)
Glucose: 77 mg/dL (ref 65–99)
Potassium: 4.1 mmol/L (ref 3.5–5.2)
SODIUM: 138 mmol/L (ref 134–144)
TOTAL PROTEIN: 6.9 g/dL (ref 6.0–8.5)

## 2017-09-06 ENCOUNTER — Encounter: Payer: Self-pay | Admitting: Gastroenterology

## 2017-09-07 ENCOUNTER — Telehealth: Payer: Self-pay

## 2017-09-07 NOTE — Telephone Encounter (Signed)
Patient called in voice mail stating she was having some symptoms and she wanted to see if she should schedule an appointment before the one she has in [place] with [place].  I called her back and reminded her that her CE appt is actually next Wednesday 09/14/17.  She said in light of that she did not need to discuss that was plenty soon enough and she would just talk with [place] then. I told her I was happy to discuss but she said she was very relieved to have the 7/10 appt and would just talk then.

## 2017-09-14 ENCOUNTER — Encounter: Payer: BC Managed Care – PPO | Admitting: Women's Health

## 2017-09-14 ENCOUNTER — Ambulatory Visit: Payer: BC Managed Care – PPO | Admitting: Women's Health

## 2017-09-14 ENCOUNTER — Encounter: Payer: Self-pay | Admitting: Women's Health

## 2017-09-14 VITALS — BP 110/72 | Ht 65.6 in | Wt 150.4 lb

## 2017-09-14 DIAGNOSIS — Z01419 Encounter for gynecological examination (general) (routine) without abnormal findings: Secondary | ICD-10-CM

## 2017-09-14 DIAGNOSIS — N920 Excessive and frequent menstruation with regular cycle: Secondary | ICD-10-CM | POA: Diagnosis not present

## 2017-09-14 MED ORDER — MEGESTROL ACETATE 40 MG PO TABS: 40.0000 mg | ORAL_TABLET | Freq: Two times a day (BID) | ORAL | 0 refills | Status: DC

## 2017-09-14 NOTE — Progress Notes (Signed)
Samantha Golden 03/02/1966 [id number]    History:    Presents for annual exam.  Spotting early for the past month with Mirena IUD.  03/2017 FSH 92.  Same partner with negative STD screen.  History of heavy cycles in the past and irregular spotting on Mirena.  Cryo in her 20s with normal Paps after.  Normal mammogram history.    Mother, maternal grandmother breast cancer, patient has negative BRCA testing.  Scheduled for colonoscopy in September. Hypothyroidism managed by endocrinologist.  History of alcohol abuse is cutting back.  Past medical history, past surgical history, family history and social history were all reviewed and documented in the EPIC chart.  First grade teacher, 3 sons one graduated from college one in college and youngest son recently joined the [employer] to become a airborne Ranger.  Partner has 3 children ages 9 through 15.  ROS:  A ROS was performed and pertinent positives and negatives are included.  Exam:  Vitals:   09/14/17 1603  BP: 110/72  Weight: 150 lb 6.4 oz (68.2 kg)  Height: 5' 5.6" (1.666 m)   Body mass index is 24.57 kg/m.   General appearance:  Normal Thyroid:  Symmetrical, normal in size, without palpable masses or nodularity. Respiratory  Auscultation:  Clear without wheezing or rhonchi Cardiovascular  Auscultation:  Regular rate, without rubs, murmurs or gallops  Edema/varicosities:  Not grossly evident Abdominal  Soft,nontender, without masses, guarding or rebound.  Liver/spleen:  No organomegaly noted  Hernia:  None appreciated  Skin  Inspection:  Grossly normal   Breasts: Examined lying and sitting.     Right: Without masses, retractions, discharge or axillary adenopathy.     Left: Without masses, retractions, discharge or axillary adenopathy. Gentitourinary   Inguinal/mons:  Normal without inguinal adenopathy  External genitalia:  Normal  BUS/Urethra/Skene's glands:  Normal  Vagina:  Normal  Cervix:  Normal, IUD strings visible, moderate  menses blood present  Uterus: normal in size, shape and contour.  Midline and mobile  Adnexa/parametria:     Rt: Without masses or tenderness.   Lt: Without masses or tenderness.  Anus and perineum: Normal  Digital rectal exam: Normal sphincter tone without palpated masses or tenderness  Assessment/Plan:  52 y.o. D WF G3, P3 for annual exam.    Heavy spotting daily for the past month Mirena IUD placed 08/2014 BRCA negative Hypothyroid-endocrinologist manages Labs/primary care  Plan: Megace 40 mg p.o. daily for 10 days, will check ultrasound to check endometrial lining thickness after bleeding stops.  TSH pending.  SBE's, overdue for mammogram strongly encouraged to schedule, reviewed importance of annual screening.  Keep scheduled screening colonoscopy appointment in September.  Continue regular exercise, calcium rich foods, vitamin D 1000 daily encouraged.  Counseling encouraged.  Pap ASCUS with negative HR HPV 2018, new screening guidelines reviewed.    Nancy J Young WHNP, 5:07 PM 09/14/2017 

## 2017-09-14 NOTE — Patient Instructions (Addendum)
Health Maintenance for Postmenopausal Women Menopause is a normal process in which your reproductive ability comes to an end. This process happens gradually over a span of months to years, usually between the ages of 83 and 41. Menopause is complete when you have missed 12 consecutive menstrual periods. It is important to talk with your health care provider about some of the most common conditions that affect postmenopausal women, such as heart disease, cancer, and bone loss (osteoporosis). Adopting a healthy lifestyle and getting preventive care can help to promote your health and wellness. Those actions can also lower your chances of developing some of these common conditions. What should I know about menopause? During menopause, you may experience a number of symptoms, such as:  Moderate-to-severe hot flashes.  Night sweats.  Decrease in sex drive.  Mood swings.  Headaches.  Tiredness.  Irritability.  Memory problems.  Insomnia.  Choosing to treat or not to treat menopausal changes is an individual decision that you make with your health care provider. What should I know about hormone replacement therapy and supplements? Hormone therapy products are effective for treating symptoms that are associated with menopause, such as hot flashes and night sweats. Hormone replacement carries certain risks, especially as you become older. If you are thinking about using estrogen or estrogen with progestin treatments, discuss the benefits and risks with your health care provider. What should I know about heart disease and stroke? Heart disease, heart attack, and stroke become more likely as you age. This may be due, in part, to the hormonal changes that your body experiences during menopause. These can affect how your body processes dietary fats, triglycerides, and cholesterol. Heart attack and stroke are both medical emergencies. There are many things that you can do to help prevent heart  disease and stroke:  Have your blood pressure checked at least every 1-2 years. High blood pressure causes heart disease and increases the risk of stroke.  If you are 12-82 years old, ask your health care provider if you should take aspirin to prevent a heart attack or a stroke.  Do not use any tobacco products, including cigarettes, chewing tobacco, or electronic cigarettes. If you need help quitting, ask your health care provider.  It is important to eat a healthy diet and maintain a healthy weight. ? Be sure to include plenty of vegetables, fruits, low-fat dairy products, and lean protein. ? Avoid eating foods that are high in solid fats, added sugars, or salt (sodium).  Get regular exercise. This is one of the most important things that you can do for your health. ? Try to exercise for at least 150 minutes each week. The type of exercise that you do should increase your heart rate and make you sweat. This is known as moderate-intensity exercise. ? Try to do strengthening exercises at least twice each week. Do these in addition to the moderate-intensity exercise.  Know your numbers.Ask your health care provider to check your cholesterol and your blood glucose. Continue to have your blood tested as directed by your health care provider.  What should I know about cancer screening? There are several types of cancer. Take the following steps to reduce your risk and to catch any cancer development as early as possible. Breast Cancer  Practice breast self-awareness. ? This means understanding how your breasts normally appear and feel. ? It also means doing regular breast self-exams. Let your health care provider know about any changes, no matter how small.  If you are 40  or older, have a clinician do a breast exam (clinical breast exam or CBE) every year. Depending on your age, family history, and medical history, it may be recommended that you also have a yearly breast X-ray  (mammogram).  If you have a family history of breast cancer, talk with your health care provider about genetic screening.  If you are at high risk for breast cancer, talk with your health care provider about having an MRI and a mammogram every year.  Breast cancer (BRCA) gene test is recommended for women who have family members with BRCA-related cancers. Results of the assessment will determine the need for genetic counseling and BRCA1 and for BRCA2 testing. BRCA-related cancers include these types: ? Breast. This occurs in males or females. ? Ovarian. ? Tubal. This may also be called fallopian tube cancer. ? Cancer of the abdominal or pelvic lining (peritoneal cancer). ? Prostate. ? Pancreatic.  Cervical, Uterine, and Ovarian Cancer Your health care provider may recommend that you be screened regularly for cancer of the pelvic organs. These include your ovaries, uterus, and vagina. This screening involves a pelvic exam, which includes checking for microscopic changes to the surface of your cervix (Pap test).  For women ages 21-65, health care providers may recommend a pelvic exam and a Pap test every three years. For women ages 72-65, they may recommend the Pap test and pelvic exam, combined with testing for human papilloma virus (HPV), every five years. Some types of HPV increase your risk of cervical cancer. Testing for HPV may also be done on women of any age who have unclear Pap test results.  Other health care providers may not recommend any screening for nonpregnant women who are considered low risk for pelvic cancer and have no symptoms. Ask your health care provider if a screening pelvic exam is right for you.  If you have had past treatment for cervical cancer or a condition that could lead to cancer, you need Pap tests and screening for cancer for at least 20 years after your treatment. If Pap tests have been discontinued for you, your risk factors (such as having a new sexual  partner) need to be reassessed to determine if you should start having screenings again. Some women have medical problems that increase the chance of getting cervical cancer. In these cases, your health care provider may recommend that you have screening and Pap tests more often.  If you have a family history of uterine cancer or ovarian cancer, talk with your health care provider about genetic screening.  If you have vaginal bleeding after reaching menopause, tell your health care provider.  There are currently no reliable tests available to screen for ovarian cancer.  Lung Cancer Lung cancer screening is recommended for adults 65-82 years old who are at high risk for lung cancer because of a history of smoking. A yearly low-dose CT scan of the lungs is recommended if you:  Currently smoke.  Have a history of at least 30 pack-years of smoking and you currently smoke or have quit within the past 15 years. A pack-year is smoking an average of one pack of cigarettes per day for one year.  Yearly screening should:  Continue until it has been 15 years since you quit.  Stop if you develop a health problem that would prevent you from having lung cancer treatment.  Colorectal Cancer  This type of cancer can be detected and can often be prevented.  Routine colorectal cancer screening usually begins at  age 30 and continues through age 22.  If you have risk factors for colon cancer, your health care provider may recommend that you be screened at an earlier age.  If you have a family history of colorectal cancer, talk with your health care provider about genetic screening.  Your health care provider may also recommend using home test kits to check for hidden blood in your stool.  A small camera at the end of a tube can be used to examine your colon directly (sigmoidoscopy or colonoscopy). This is done to check for the earliest forms of colorectal cancer.  Direct examination of the colon  should be repeated every 5-10 years until age 28. However, if early forms of precancerous polyps or small growths are found or if you have a family history or genetic risk for colorectal cancer, you may need to be screened more often.  Skin Cancer  Check your skin from head to toe regularly.  Monitor any moles. Be sure to tell your health care provider: ? About any new moles or changes in moles, especially if there is a change in a mole's shape or color. ? If you have a mole that is larger than the size of a pencil eraser.  If any of your family members has a history of skin cancer, especially at a Cydne Grahn age, talk with your health care provider about genetic screening.  Always use sunscreen. Apply sunscreen liberally and repeatedly throughout the day.  Whenever you are outside, protect yourself by wearing long sleeves, pants, a wide-brimmed hat, and sunglasses.  What should I know about osteoporosis? Osteoporosis is a condition in which bone destruction happens more quickly than new bone creation. After menopause, you may be at an increased risk for osteoporosis. To help prevent osteoporosis or the bone fractures that can happen because of osteoporosis, the following is recommended:  If you are 62-69 years old, get at least 1,000 mg of calcium and at least 600 mg of vitamin D per day.  If you are older than age 60 but younger than age 68, get at least 1,200 mg of calcium and at least 600 mg of vitamin D per day.  If you are older than age 35, get at least 1,200 mg of calcium and at least 800 mg of vitamin D per day.  Smoking and excessive alcohol intake increase the risk of osteoporosis. Eat foods that are rich in calcium and vitamin D, and do weight-bearing exercises several times each week as directed by your health care provider. What should I know about how menopause affects my mental health? Depression may occur at any age, but it is more common as you become older. Common symptoms of  depression include:  Low or sad mood.  Changes in sleep patterns.  Changes in appetite or eating patterns.  Feeling an overall lack of motivation or enjoyment of activities that you previously enjoyed.  Frequent crying spells.  Talk with your health care provider if you think that you are experiencing depression. What should I know about immunizations? It is important that you get and maintain your immunizations. These include:  Tetanus, diphtheria, and pertussis (Tdap) booster vaccine.  Influenza every year before the flu season begins.  Pneumonia vaccine.  Shingles vaccine.  Your health care provider may also recommend other immunizations. This information is not intended to replace advice given to you by your health care provider. Make sure you discuss any questions you have with your health care provider. Document Released: 04/16/2005  Document Revised: 09/12/2015 Document Reviewed: 11/26/2014 Elsevier Interactive Patient Education  2018 Elsevier Inc.  

## 2017-09-20 ENCOUNTER — Other Ambulatory Visit: Payer: Self-pay | Admitting: Women's Health

## 2017-09-20 DIAGNOSIS — Z1231 Encounter for screening mammogram for malignant neoplasm of breast: Secondary | ICD-10-CM

## 2017-09-22 ENCOUNTER — Encounter: Payer: Self-pay | Admitting: Women's Health

## 2017-09-22 LAB — HM DIABETES EYE EXAM

## 2017-09-26 ENCOUNTER — Ambulatory Visit (INDEPENDENT_AMBULATORY_CARE_PROVIDER_SITE_OTHER): Payer: BC Managed Care – PPO

## 2017-09-26 ENCOUNTER — Other Ambulatory Visit: Payer: Self-pay | Admitting: Women's Health

## 2017-09-26 ENCOUNTER — Encounter: Payer: Self-pay | Admitting: Women's Health

## 2017-09-26 ENCOUNTER — Ambulatory Visit: Payer: BC Managed Care – PPO | Admitting: Women's Health

## 2017-09-26 VITALS — BP 122/80

## 2017-09-26 DIAGNOSIS — N938 Other specified abnormal uterine and vaginal bleeding: Secondary | ICD-10-CM | POA: Diagnosis not present

## 2017-09-26 DIAGNOSIS — N923 Ovulation bleeding: Secondary | ICD-10-CM

## 2017-09-26 DIAGNOSIS — T8383XD Hemorrhage of genitourinary prosthetic devices, implants and grafts, subsequent encounter: Principal | ICD-10-CM

## 2017-09-26 DIAGNOSIS — IMO0001 Reserved for inherently not codable concepts without codable children: Secondary | ICD-10-CM

## 2017-09-26 DIAGNOSIS — Z975 Presence of (intrauterine) contraceptive device: Secondary | ICD-10-CM | POA: Diagnosis not present

## 2017-09-26 DIAGNOSIS — N920 Excessive and frequent menstruation with regular cycle: Secondary | ICD-10-CM

## 2017-09-26 NOTE — Progress Notes (Signed)
52 year old D WF G3, P3 presents for ultrasound.  08/2014 Mirena IUD has had intermittent spotting throughout but  had a 34-month episode of spotting daily with an [place] of 92.  Bleeding stopped after last office visit.  Hypothyroid.  Same partner.  Denies abdominal pain, urinary symptoms or vaginal discharge.  Exam: Appears well.  Ultrasound: T/V ultrasound uterus anteverted homogeneous.  Endometrium within normal limits,  2.1 mm.  Right and left ovary normal.  Negative cul-de-sac.  No apparent mass right or left adnexal.  IUD seen in normal position.  Spotting with Mirena IUD  Exam:  Ultrasound results reviewed normality of endometrium and ovaries.  Reviewed thin endometrium reassuring.  Instructed to call if further bleeding.

## 2017-09-26 NOTE — Patient Instructions (Signed)

## 2017-10-03 ENCOUNTER — Encounter: Payer: Self-pay | Admitting: Internal Medicine

## 2017-10-03 ENCOUNTER — Ambulatory Visit: Payer: BC Managed Care – PPO | Admitting: Internal Medicine

## 2017-10-03 VITALS — BP 140/70 | HR 87 | Ht 66.0 in | Wt 148.8 lb

## 2017-10-03 DIAGNOSIS — E038 Other specified hypothyroidism: Secondary | ICD-10-CM | POA: Diagnosis not present

## 2017-10-03 DIAGNOSIS — E063 Autoimmune thyroiditis: Secondary | ICD-10-CM | POA: Diagnosis not present

## 2017-10-03 DIAGNOSIS — N951 Menopausal and female climacteric states: Secondary | ICD-10-CM | POA: Diagnosis not present

## 2017-10-03 LAB — T4, FREE: FREE T4: 0.65 ng/dL (ref 0.60–1.60)

## 2017-10-03 LAB — TSH: TSH: 0.05 u[IU]/mL — ABNORMAL LOW (ref 0.35–4.50)

## 2017-10-03 LAB — T3, FREE: T3, Free: 4.7 pg/mL — ABNORMAL HIGH (ref 2.3–4.2)

## 2017-10-03 MED ORDER — ARMOUR THYROID 90 MG PO TABS: 90.0000 mg | ORAL_TABLET | Freq: Every day | ORAL | 3 refills | Status: DC

## 2017-10-03 NOTE — Progress Notes (Signed)
Patient ID: Samantha Golden, female   DOB: Feb 11, 1966, 52 y.o.   MRN: [id number]   HPI  Samantha Golden is a 52 y.o.-year-old female-year-old female, initially referred by Dr Phineas Real, returning for f/u for uncontrolled Hashimoto's hypothyroidism. Last visit 6 months ago.  Reviewed and addended history: Pt. has been dx with hypothyroidism in ~1990.   Previously on Synthroid DAW 225 mcg >> 300 mcg >> ... >> Currently on Armour  She is on Armour 90 alternating with 120 mg every other day.  I again advised her to decrease the dose from 120 mg daily at last visit, after TSH returned suppressed.   She takes the Armour: - Daily, no skipped doses - in am - fasting - lemon with water - at least 45-60 min from b'fast - no Ca, Fe, MVI, PPIs - not on Biotin  Reviewed patient's TFTs.  Latest TSH was suppressed, after which we decreased her Armour dose. Lab Results  Component Value Date   TSH 0.03 (L) 04/07/2017   TSH 0.174 (L) 09/15/2016   TSH 0.61 04/08/2016   TSH 0.05 (L) 05/12/2015   TSH 0.08 (L) 03/26/2015   TSH 4.84 (H) 09/03/2014   TSH 0.18 (L) 05/13/2014   TSH 58.73 (H) 04/08/2014   TSH 0.08 (L) 12/06/2013   TSH 130.618 (H) 10/23/2013   FREET4 0.92 04/07/2017   FREET4 1.04 09/15/2016   FREET4 1.08 03/26/2015   FREET4 0.39 (L) 09/03/2014   FREET4 0.64 05/13/2014   FREET4 0.84 04/08/2014   FREET4 1.75 (H) 12/06/2013  TSH 4.26 in 2012. She has been out of the LT4 for ~ 1 mo before the test in 10/2013.  She had positive TPO antibodies, confirming Hashimoto's thyroiditis: Component     Latest Ref Rng & Units 10/23/2013  Thyroperoxidase Ab SerPl-aCnc     <35.0 IU/mL 265.0 (H)  Thyroglobulin Ab     <40.0 IU/mL 38.2   She got palpitations when she took Adderall with the LT4 300 mcg.  Pt denies: - feeling nodules in neck - hoarseness - dysphagia, only rarely - choking - SOB with lying down  ROS: Constitutional: + weight gain (not by our scale)/no weight loss, no fatigue, + hot flashes, no  subjective hypothermia Eyes: no blurry vision, no xerophthalmia ENT: no sore throat, + see HPI Cardiovascular: no CP/+ SOB/no palpitations/no leg swelling Respiratory: no cough/+ SOB/no wheezing Gastrointestinal: no N/no V/+ D/no C/no acid reflux Musculoskeletal: no muscle aches/+ joint aches Skin: no rashes, no hair loss Neurological: no tremors/no numbness/no tingling/no dizziness + irritability   I reviewed pt's medications, allergies, PMH, social hx, family hx, and changes were documented in the history of present illness. Otherwise, unchanged from my initial visit note.  Past Medical History:  Diagnosis Date  . Abnormal cervical Papanicolaou smear    ? LASER TREATMENT   . Allergy   . BRCA negative 08/09  . Cervical dysplasia    Past Surgical History:  Procedure Laterality Date  . ADENOIDECTOMY    . CERVIX LESION DESTRUCTION    . COLPOSCOPY    . INTRAUTERINE DEVICE (IUD) INSERTION  09/04/2014   Mirena   History   Social History  . Marital Status: Divorced    Spouse Name: N/A    Number of Children: 3   Occupational History  . teacher   Social History Main Topics  . Smoking status: Former Research scientist (life sciences)  . Smokeless tobacco: Never Used  . Alcohol Use: 1.0 oz/week    2 drink(s) per week  .  Drug Use: No  . Sexual Activity: Yes    Birth Control/ Protection: IUD     Comment: ParaGard 08/2012   Current Outpatient Medications on File Prior to Visit  Medication Sig Dispense Refill  . Albuterol Sulfate (PROAIR RESPICLICK) 940 (90 Base) MCG/ACT AEPB Inhale 1 Inhaler every 4 (four) hours as needed into the lungs. 1 each 0  . ARMOUR THYROID 120 MG tablet TAKE 1 TABLET BEFORE BREAKFAST. 45 tablet 0  . ARMOUR THYROID 90 MG tablet Take 1 tablet (90 mg total) by mouth every other day. 45 tablet 3  . estradiol (ESTRACE) 1 MG tablet TAKE 1 TABLET ONCE DAILY. 30 tablet 6  . fluticasone (FLOVENT HFA) 44 MCG/ACT inhaler Inhale 2 puffs 2 (two) times daily into the lungs. 1 Inhaler 12  .  naproxen (NAPROSYN) 500 MG tablet Take 1 tablet (500 mg total) by mouth 2 (two) times daily with a meal. 30 tablet 0   Current Facility-Administered Medications on File Prior to Visit  Medication Dose Route Frequency Provider Last Rate Last Dose  . levonorgestrel (MIRENA) 20 MCG/24HR IUD   Intrauterine Once Fontaine, Belinda Block, MD       Allergies  Allergen Reactions  . Latex Shortness Of Breath, Itching and Rash    Wheezing when she removes latex gloves after working, does not use anything latex !   . Augmentin [Amoxicillin-Pot Clavulanate]   . Other     "CLEANING MATERIALS"   Family History  Problem Relation Age of Onset  . Breast cancer Mother        Age 37  . Breast cancer Maternal Grandmother        Age 29's  . Diabetes Maternal Grandfather   . Alcohol abuse Brother   . Cancer Paternal Aunt        Cervical  . Diabetes Paternal Uncle   . Breast cancer Paternal Aunt        Age 19's   PE: BP 140/70   Pulse 87   Ht '5\' 6"'  (1.676 m)   Wt 148 lb 12.8 oz (67.5 kg)   SpO2 98%   BMI 24.02 kg/m  Body mass index is 24.02 kg/m.  Wt Readings from Last 3 Encounters:  10/03/17 148 lb 12.8 oz (67.5 kg)  09/14/17 150 lb 6.4 oz (68.2 kg)  08/31/17 149 lb 3.2 oz (67.7 kg)   Constitutional: Normal rweight, in NAD Eyes: PERRLA, EOMI, no exophthalmos ENT: moist mucous membranes, no thyromegaly, no cervical lymphadenopathy Cardiovascular: RRR, No MRG Respiratory: CTA B Gastrointestinal: abdomen soft, NT, ND, BS+ Musculoskeletal: no deformities, strength intact in all 4 Skin: moist, warm, no rashes Neurological: no tremor with outstretched hands, DTR normal in all 4  ASSESSMENT: 1. Hashimoto's Hypothyroidism - did not feel good on Synthroid  2.  Perimenopause  PLAN:  1. Patient with long-standing, uncontrolled, Hashimoto's hypothyroidism, now on Armour Thyroid.  She tried levothyroxine/Synthroid and did not feel good on this.  She felt better on Armour, but at last visit she  had many nonspecific symptoms, most likely related to menopause.  We checked an [place] and LH at that time and they were very elevated, confirming menopause.  As TSH was suppressed then, I advised her to decrease the Armour dose to 90 alternating with 120 mg every other day.  She has been on this dose in the past, but she switched back to 120 mg daily by herself since she did not feel good on the lower dose.  We discussed at last  visit and again today about possible risks of over replacement with Armour: Palpitations, tremors, anxiety, heat intolerance, insomnia, weight loss, but in the long run: Arrhythmia, hypercoagulability, osteoporosis.  She reluctantly agreed to continue the dose that yields normal TFTs.  She did not come back for labs, however, after the change in dose. - latest thyroid labs reviewed with pt >> suppressed, as mentioned above - she continues on 90 alternating with 120 mg every other day - pt feels good on this dose. - we discussed about taking the thyroid hormone every day, with water, >30 minutes before breakfast, separated by >4 hours from acid reflux medications, calcium, iron, multivitamins. Pt. is taking it correctly. - will check thyroid tests today: TSH, free T3, and fT4 - If labs are abnormal, she will need to return for repeat TFTs in 1.5 months - I will see her back for a follow-up appointment in 6 months  2.  Perimenopause  - reviewed together her previous labs obtained at last visit: [place] in the menopausal range, however, as she is on a progesterone IUD, we are not sure whether she still has menstrual cycles.  Her OB/GYN recommended to keep the IUD in for longer.   - Upon her questioning, we discussed about benefits and side effects of HRT and also timeline: We may use these until the average age of menopause, which is 57 in the [place], and could even extend the treatment to 50 years old if the symptoms are severe. - She does have some irritability but will discuss with OB/GYN  whether or not to get a trial of estrogen patches along with her progesterone IUD  - time spent with the patient: 20 minutes, of which >50% was spent in obtaining information about her symptoms, reviewing her previous labs, evaluations, and treatments, counseling her about her conditions (please see the discussed topics above), and developing a plan to further investigate and treat them; she had a number of questions which I addressed.  Needs refills.  Component     Latest Ref Rng & Units 10/03/2017  TSH     0.35 - 4.50 uIU/mL 0.05 (L)  T4,Free(Direct)     0.60 - 1.60 ng/dL 0.65  Triiodothyronine,Free,Serum     2.3 - 4.2 pg/mL 4.7 (H)   Thyroid tests are still abnormal >>  need to decrease the Armour dose even further, to 90 mcg daily and have her back in 1.5 months for a recheck of her TFTs.  Philemon Kingdom, MD PhD [facility] Endocrin[facility]

## 2017-10-03 NOTE — Patient Instructions (Signed)
Please stop at the lab.  Please continue Armour 90 alternating with 120 mg every other day.  Take the thyroid hormone every day, with water, at least 30 minutes before breakfast, separated by at least 4 hours from: - acid reflux medications - calcium - iron - multivitamins  Please return in 6 months.

## 2017-10-11 ENCOUNTER — Ambulatory Visit: Payer: Self-pay

## 2017-11-08 ENCOUNTER — Ambulatory Visit: Payer: BC Managed Care – PPO | Admitting: Gastroenterology

## 2017-11-08 ENCOUNTER — Other Ambulatory Visit: Payer: BC Managed Care – PPO

## 2017-11-08 ENCOUNTER — Encounter: Payer: Self-pay | Admitting: Gastroenterology

## 2017-11-08 VITALS — BP 110/70 | HR 80 | Ht 66.0 in | Wt 150.6 lb

## 2017-11-08 DIAGNOSIS — R1030 Lower abdominal pain, unspecified: Secondary | ICD-10-CM | POA: Diagnosis not present

## 2017-11-08 DIAGNOSIS — K529 Noninfective gastroenteritis and colitis, unspecified: Secondary | ICD-10-CM | POA: Diagnosis not present

## 2017-11-08 DIAGNOSIS — R140 Abdominal distension (gaseous): Secondary | ICD-10-CM | POA: Insufficient documentation

## 2017-11-08 DIAGNOSIS — Z1211 Encounter for screening for malignant neoplasm of colon: Secondary | ICD-10-CM

## 2017-11-08 DIAGNOSIS — R1084 Generalized abdominal pain: Secondary | ICD-10-CM | POA: Insufficient documentation

## 2017-11-08 NOTE — Progress Notes (Signed)
[place] VISIT   Primary Care Provider Leonie Douglas, PA-C [place] [address] [phone]  Referring Provider Leonie Douglas, PA-C [place], [place] [address] [phone]  Patient Profile: Samantha Golden is a 52 y.o. female with a pmh significant for Hashimoto's Disease, Seasonal Allergies, GAD.  The patient presents to the [facility] for an evaluation and management of problem(s) noted below:  Problem List 1. Bloating   2. Chronic diarrhea   3. Lower abdominal pain   4. Generalized postprandial abdominal pain   5. Colon cancer screening     History of Present Illness: This is the patient's first visit to the GI [place] clinic for evaluation of her GI symptoms.  She describes a long history over the course of many years of issues including a postprandial abdominal discomfort that occurs more often in the evening after she has had dinner.  Interestingly this discomfort is associated with pain in the lower abdomen bilaterally rather than in the midepigastric or upper abdomen.  The discomfort is crampy in quality.  She develops queasiness, nausea, uneasiness when this occurs but she does not have any regurgitation or vomiting.  In the past she has had issues with abdominal bloating in the upper abdomen but this has been less of an issue more recently.  She does describe a history of chronic, loose stools going on for over 5 years.  She describes the bowel movements as fatty/oily at times with it floating at times.  She describes having at least 2-4 bowel movements per day.  The Bristol scale is between a 5 and 7.  If the patient has her bowel movements she will have improvement in her discomfort greater than half the time however she does at times feel as if she is incompletely evacuated.  She has never had fecal incontinence.  She is worried about the possibility of exocrine pancreatic insufficiency, due to  her history and her boyfriend of recurrent pancreatitis/chronic pancreatitis.  She describes not really being evaluated by a physician for this.  She has not had any significant traveling.  She does consume alcohol on a daily basis.  She drinks between 4-6 shots of liquor per day for the last few years.  She uses this as a relaxation and enjoyment over the course of the long week when she is done with work and drinks about the same on the weekends per her report.  She has cut back in the past but did not feel that it changed any of her symptoms significantly.  She is willing to continue to try and cut back if it that will make a difference.  She has had a stable weight to slightly increase in her weight.  The patient does not use nonsteroidals or BC/Goody powders.  She has never had an upper or lower endoscopy.  GI Review of Systems Positive as above Negative for pyrosis, dysphagia, odynophagia, jaundice, melena, hematochezia  Review of Systems General: Denies fevers/chills/night sweats HEENT: Denies oral lesions Cardiovascular: Denies chest pain/palpitations Pulmonary: Denies shortness of breath Gastroenterological: See HPI Genitourinary: Denies hematuria or dark and urine  Hematological: Denies easy bruising/bleeding Endocrine: Denies temperature intolerance Dermatological: Denies skin rashes Psychological: Mood is stable, she is not a been under increased life stressors Musculoskeletal: Denies new arthralgias   Medications Current Outpatient Medications  Medication Sig Dispense Refill  . Albuterol Sulfate (PROAIR RESPICLICK) 416 (90 Base) MCG/ACT AEPB Inhale 1 Inhaler every 4 (four) hours as needed  into the lungs. 1 each 0  . ARMOUR THYROID 90 MG tablet Take 1 tablet (90 mg total) by mouth daily. 90 tablet 3  . estradiol (ESTRACE) 1 MG tablet TAKE 1 TABLET ONCE DAILY. (Patient taking differently: Take 1 mg by mouth as needed. ) 30 tablet 6  . fluticasone (FLOVENT HFA) 44 MCG/ACT inhaler  Inhale 2 puffs 2 (two) times daily into the lungs. 1 Inhaler 12  . polyethylene glycol-electrolytes (NULYTELY/GOLYTELY) 420 g solution Take 4,000 mLs by mouth as directed. 4000 mL 0   Current Facility-Administered Medications  Medication Dose Route Frequency Provider Last Rate Last Dose  . levonorgestrel (MIRENA) 20 MCG/24HR IUD   Intrauterine Once Fontaine, Belinda Block, MD        Allergies Allergies  Allergen Reactions  . Latex Shortness Of Breath, Itching and Rash    Wheezing when she removes latex gloves after working, does not use anything latex !   . Augmentin [Amoxicillin-Pot Clavulanate]   . Other     "CLEANING MATERIALS" (bleach, ammonia)    Histories Past Medical History:  Diagnosis Date  . Abnormal cervical Papanicolaou smear    ? LASER TREATMENT   . Allergy   . Anxiety   . BRCA negative 08/09  . Cervical dysplasia   . Hashimoto's disease    Past Surgical History:  Procedure Laterality Date  . ADENOIDECTOMY    . CERVIX LESION DESTRUCTION    . COLPOSCOPY    . INTRAUTERINE DEVICE (IUD) INSERTION  09/04/2014   Mirena   Social History   Socioeconomic History  . Marital status: Divorced    Spouse name: Not on file  . Number of children: 3  . Years of education: Not on file  . Highest education level: Not on file  Occupational History  . Not on file  Social Needs  . Financial resource strain: Not on file  . Food insecurity:    Worry: Not on file    Inability: Not on file  . Transportation needs:    Medical: Not on file    Non-medical: Not on file  Tobacco Use  . Smoking status: Former Smoker    Years: 10.00    Types: Cigarettes  . Smokeless tobacco: Never Used  Substance and Sexual Activity  . Alcohol use: Yes    Alcohol/week: 28.0 - 42.0 standard drinks    Types: 28 - 42 Shots of liquor per week    Frequency: Never    Comment: 4-6 drinks daily  . Drug use: No  . Sexual activity: Yes    Birth control/protection: IUD    Comment: MIRENA 09/04/2014-  first sexual encounter 20 , fewer than 5 partners  Lifestyle  . Physical activity:    Days per week: Not on file    Minutes per session: Not on file  . Stress: Not on file  Relationships  . Social connections:    Talks on phone: Not on file    Gets together: Not on file    Attends religious service: Not on file    Active member of club or organization: Not on file    Attends meetings of clubs or organizations: Not on file    Relationship status: Not on file  . Intimate partner violence:    Fear of current or ex partner: Not on file    Emotionally abused: Not on file    Physically abused: Not on file    Forced sexual activity: Not on file  Other Topics Concern  .  Not on file  Social History Narrative  . Not on file   Family History  Problem Relation Age of Onset  . Breast cancer Mother        Age 67  . Breast cancer Maternal Grandmother        Age 43's  . Diabetes Maternal Grandfather   . Alcohol abuse Brother   . Cancer Paternal Aunt        Cervical  . Diabetes Paternal Uncle   . Breast cancer Paternal Aunt        Age 31's  . Colon cancer Neg Hx   . Esophageal cancer Neg Hx   . Stomach cancer Neg Hx   . Pancreatic cancer Neg Hx   . Liver disease Neg Hx   . Inflammatory bowel disease Neg Hx    I have reviewed her medical, social, and family history in detail and updated the electronic medical record as necessary.    PHYSICAL EXAMINATION  BP 110/70   Pulse 80   Ht '5\' 6"'  (1.676 m)   Wt 150 lb 9.6 oz (68.3 kg)   BMI 24.31 kg/m  GEN: NAD, appears stated age, doesn't appear chronically ill PSYCH: Cooperative, without pressured speech EYE: Conjunctivae pink, sclerae anicteric ENT: MMM, without oral ulcers NECK: Supple, without goiter CV: RR without R/Gs  RESP: CTAB posteriorly GI: NABS, soft, minimal tenderness to palpation in the left lower quadrant and right lower quadrant and suprapubic region upon deep palpation, nondistended, rounded, without rebound or  guarding  MSK/EXT: No lower extremity edema SKIN: No concerning rashes/lesions, no spider angiomata NEURO:  Alert & Oriented x 3, no focal deficits   REVIEW OF DATA  I reviewed the following data at the time of this encounter:  GI Procedures and Studies  Not applicable  Laboratory Studies  Reviewed what was in EPIC & CareEverywhere  Imaging Studies  No relevant studies to review   ASSESSMENT  Ms. Mcquitty is a 52 y.o. female with a pmh significant for Hashimoto's Disease, Seasonal Allergies, GAD.  The patient is seen today for a return visit for evaluation and management of:  1. Bloating   2. Chronic diarrhea   3. Lower abdominal pain   4. Generalized postprandial abdominal pain   5. Colon cancer screening    This is a hemodynamically stable patient who presents for multiple GI issues including chronically loose bowel movements as well as abdominal discomfort/bloating issues.  She is never undergone colon cancer screening either.  I suspect that this patient will end up having a spectrum of functional diarrhea.  I do think it is reasonable however to be in a diagnostic evaluation as outlined below to rule out more significant GI disorders.  She is also due for colon cancer screening so evaluation of her colon would be reasonable as well.  We discussed different possibilities of GI disorders that could cause her some of the symptoms including celiac disease, microscopic colitis, and other infectious etiologies.  Based on a stepwise diagnostic approach think it would be reasonable for [place] to try and rule out causes and then treat symptoms if a disease process is not found.  I will hold on TCA neuromodulation until we have begun this particular work-up.  I think exocrine pancreatic insufficiency is less likely based on never having had a prior history of pancreatitis or pancreatic known disease however we would consider the role of testing for this as well.  SIBO may be considered as well and  may need  breath testing in the future.  I think an evaluation of the upper GI tract as well is reasonable to consider an would be planned at the same time as colonoscopy.  So that etiologies of neuropathy can also be ruled out.  The risks and benefits of endoscopic evaluation were discussed with the patient; these include but are not limited to the risk of perforation, infection, bleeding, missed lesions, lack of diagnosis, severe illness requiring hospitalization, as well as anesthesia and sedation related illnesses.  The patient is agreeable to proceed.  All questions were answered to the best of my ability and the patient agrees with this plan of action.  PLAN  1. Bloating - Work up as below - Consider SIBO in future - Helicobacter pylori antigen det, stool; Future  2. Chronic diarrhea - Pancreatic elastase, fecal; Future - Gastrointestinal Pathogen Panel PCR; Future - Clostridium difficile Toxin B, Qualitative, Real-Time PCR; Future - Ova and parasite examination; Future - Stool culture; Future - IgA - Tissue transglutaminase, IgA - CBC - EGD/Colonoscopy  3. Lower abdominal pain - Consider Neuromodulation in future  4. Generalized postprandial abdominal pain  5. Colon cancer screening   Orders Placed This Encounter  Procedures  . Ova and parasite examination  . Stool culture  . Pancreatic elastase, fecal  . Gastrointestinal Pathogen Panel PCR  . Clostridium difficile Toxin B, Qualitative, Real-Time PCR  . Helicobacter pylori antigen det, stool  . IgA  . Tissue transglutaminase, IgA  . CBC  . Ambulatory referral to Gastroenterology    New Prescriptions   POLYETHYLENE GLYCOL-ELECTROLYTES (NULYTELY/GOLYTELY) 420 G SOLUTION    Take 4,000 mLs by mouth as directed.   Modified Medications   No medications on file    Planned Follow Up: No follow-ups on file.   Justice Britain, MD [facility] Office # [phone]

## 2017-11-08 NOTE — Patient Instructions (Signed)
Normal BMI (Body Mass Index- based on height and weight) is between 19 and 25. Your BMI today is Body mass index is 24.31 kg/m. Marland Kitchen Please consider follow up  regarding your BMI with your Primary Care Provider.  You have been scheduled for a colonoscopy/EGD. Please follow written instructions given to you at your visit today.  Please pick up your prep supplies at the pharmacy within the next 1-3 days. If you use inhalers (even only as needed), please bring them with you on the day of your procedure. Your physician has requested that you go to www.startemmi.com and enter the access code given to you at your visit today. This web site gives a general overview about your procedure. However, you should still follow specific instructions given to you by our office regarding your preparation for the procedure.  Your provider has requested that you go to the basement level for lab work before leaving today. Press "B" on the elevator. The lab is located at the first door on the left as you exit the elevator.

## 2017-11-09 MED ORDER — PEG 3350-KCL-NA BICARB-NACL 420 G PO SOLR: 4000.0000 mL | ORAL | 0 refills | Status: DC

## 2017-11-10 ENCOUNTER — Other Ambulatory Visit: Payer: BC Managed Care – PPO

## 2017-11-10 DIAGNOSIS — R140 Abdominal distension (gaseous): Secondary | ICD-10-CM

## 2017-11-10 DIAGNOSIS — K529 Noninfective gastroenteritis and colitis, unspecified: Secondary | ICD-10-CM

## 2017-11-10 DIAGNOSIS — R1030 Lower abdominal pain, unspecified: Secondary | ICD-10-CM

## 2017-11-11 LAB — HELICOBACTER PYLORI  SPECIAL ANTIGEN
MICRO NUMBER: 91062374
SPECIMEN QUALITY: ADEQUATE

## 2017-11-20 LAB — CLOSTRIDIUM DIFFICILE TOXIN B, QUALITATIVE, REAL-TIME PCR: Toxigenic C. Difficile by PCR: NOT DETECTED

## 2017-11-20 LAB — GASTROINTESTINAL PATHOGEN PANEL PCR
C. DIFFICILE TOX A/B, PCR: NOT DETECTED
Campylobacter, PCR: NOT DETECTED
Cryptosporidium, PCR: NOT DETECTED
E COLI (ETEC) LT/ST, PCR: NOT DETECTED
E COLI (STEC) STX1/STX2, PCR: NOT DETECTED
E coli 0157, PCR: NOT DETECTED
Giardia lamblia, PCR: NOT DETECTED
Norovirus, PCR: NOT DETECTED
Rotavirus A, PCR: NOT DETECTED
SALMONELLA, PCR: NOT DETECTED
SHIGELLA, PCR: NOT DETECTED

## 2017-11-20 LAB — STOOL CULTURE
MICRO NUMBER: 91062161
MICRO NUMBER:: 91062162
MICRO NUMBER:: 91062164
SHIGA RESULT:: NOT DETECTED
SPECIMEN QUALITY: ADEQUATE
SPECIMEN QUALITY: ADEQUATE
SPECIMEN QUALITY:: ADEQUATE

## 2017-11-20 LAB — OVA AND PARASITE EXAMINATION
CONCENTRATE RESULT: NONE SEEN
SPECIMEN QUALITY:: ADEQUATE
TRICHROME RESULT:: NONE SEEN
VKL: 91062163

## 2017-11-20 LAB — PANCREATIC ELASTASE, FECAL

## 2017-11-29 ENCOUNTER — Encounter: Payer: Self-pay | Admitting: Gastroenterology

## 2017-12-02 ENCOUNTER — Telehealth: Payer: Self-pay | Admitting: Gastroenterology

## 2017-12-02 ENCOUNTER — Other Ambulatory Visit (INDEPENDENT_AMBULATORY_CARE_PROVIDER_SITE_OTHER): Payer: BC Managed Care – PPO

## 2017-12-02 DIAGNOSIS — E063 Autoimmune thyroiditis: Secondary | ICD-10-CM

## 2017-12-02 DIAGNOSIS — Z1231 Encounter for screening mammogram for malignant neoplasm of breast: Secondary | ICD-10-CM | POA: Diagnosis not present

## 2017-12-02 DIAGNOSIS — E038 Other specified hypothyroidism: Secondary | ICD-10-CM

## 2017-12-02 LAB — CBC WITH DIFFERENTIAL/PLATELET
BASOS PCT: 1.1 % (ref 0.0–3.0)
Basophils Absolute: 0.1 10*3/uL (ref 0.0–0.1)
EOS ABS: 0.1 10*3/uL (ref 0.0–0.7)
Eosinophils Relative: 1.9 % (ref 0.0–5.0)
HCT: 44.6 % (ref 36.0–46.0)
HEMOGLOBIN: 15.3 g/dL — AB (ref 12.0–15.0)
Lymphocytes Relative: 25.3 % (ref 12.0–46.0)
Lymphs Abs: 1.4 10*3/uL (ref 0.7–4.0)
MCHC: 34.4 g/dL (ref 30.0–36.0)
MCV: 96.6 fl (ref 78.0–100.0)
MONO ABS: 0.6 10*3/uL (ref 0.1–1.0)
Monocytes Relative: 11.6 % (ref 3.0–12.0)
Neutro Abs: 3.2 10*3/uL (ref 1.4–7.7)
Neutrophils Relative %: 60.1 % (ref 43.0–77.0)
PLATELETS: 224 10*3/uL (ref 150.0–400.0)
RBC: 4.61 Mil/uL (ref 3.87–5.11)
RDW: 13.6 % (ref 11.5–15.5)
WBC: 5.3 10*3/uL (ref 4.0–10.5)

## 2017-12-02 LAB — T3, FREE: T3 FREE: 4.2 pg/mL (ref 2.3–4.2)

## 2017-12-02 LAB — IGA: IGA: 256 mg/dL (ref 68–378)

## 2017-12-02 LAB — TSH: TSH: 0.26 u[IU]/mL — ABNORMAL LOW (ref 0.35–4.50)

## 2017-12-02 LAB — T4, FREE: Free T4: 0.62 ng/dL (ref 0.60–1.60)

## 2017-12-02 NOTE — Telephone Encounter (Signed)
Pls disregard previous message. Pt called again stating that she found lab orders at her house.

## 2017-12-02 NOTE — Telephone Encounter (Signed)
Pt lost orders for labs, she insisted that Dr. Rush Landmark told her that she needs those orders to take to the lab. She is asking if they can be reprinted and left at the front desk. She will come to pick them up in 30 minutes.

## 2017-12-05 LAB — TISSUE TRANSGLUTAMINASE, IGA: (tTG) Ab, IgA: 1 U/mL

## 2017-12-09 ENCOUNTER — Telehealth: Payer: Self-pay | Admitting: Gastroenterology

## 2017-12-09 NOTE — Telephone Encounter (Signed)
The pt wanted to know if she could have wine as a clear liquid the day prior to her procedure.  She was advised that she could not have alcohol.  She also wanted to make sure she would be sedated.  We spoke about the sedation used and I answered all her questions to the best of my ability.  She thanked me for my help and verbalized understanding.

## 2017-12-12 ENCOUNTER — Telehealth: Payer: Self-pay

## 2017-12-12 NOTE — Telephone Encounter (Signed)
Returned patients call regarding prep instructions. Patient is using Golytely prep and the instructions say to begin drinking the second part of the prep six hours before the procedure. However the written instructions said to start at 9:30 Am. That is five hours before the procedure. Patient was told to begin drinking the prep at 8:30 which is 6 hours before the procedure at 2:30. Patient wanted to start earlier than 8:30, because it is so much to drink. I told the patient the earliest she could begin the prep would be 8:00 Am. Patient verbalizes understanding.   Riki Sheer, LPN ( Admitting )

## 2017-12-13 ENCOUNTER — Ambulatory Visit (AMBULATORY_SURGERY_CENTER): Payer: BC Managed Care – PPO | Admitting: Gastroenterology

## 2017-12-13 ENCOUNTER — Encounter: Payer: Self-pay | Admitting: Gastroenterology

## 2017-12-13 VITALS — BP 139/98 | HR 84 | Temp 98.9°F | Resp 16 | Ht 66.0 in | Wt 150.0 lb

## 2017-12-13 DIAGNOSIS — D123 Benign neoplasm of transverse colon: Secondary | ICD-10-CM

## 2017-12-13 DIAGNOSIS — K317 Polyp of stomach and duodenum: Secondary | ICD-10-CM | POA: Diagnosis not present

## 2017-12-13 DIAGNOSIS — K529 Noninfective gastroenteritis and colitis, unspecified: Secondary | ICD-10-CM | POA: Diagnosis not present

## 2017-12-13 DIAGNOSIS — Z1211 Encounter for screening for malignant neoplasm of colon: Secondary | ICD-10-CM | POA: Diagnosis not present

## 2017-12-13 DIAGNOSIS — R1030 Lower abdominal pain, unspecified: Secondary | ICD-10-CM

## 2017-12-13 DIAGNOSIS — K648 Other hemorrhoids: Secondary | ICD-10-CM

## 2017-12-13 DIAGNOSIS — K635 Polyp of colon: Secondary | ICD-10-CM

## 2017-12-13 DIAGNOSIS — K2970 Gastritis, unspecified, without bleeding: Secondary | ICD-10-CM | POA: Diagnosis not present

## 2017-12-13 DIAGNOSIS — D127 Benign neoplasm of rectosigmoid junction: Secondary | ICD-10-CM

## 2017-12-13 MED ORDER — SODIUM CHLORIDE 0.9 % IV SOLN: 500.0000 mL | INTRAVENOUS | Status: DC

## 2017-12-13 NOTE — Op Note (Signed)
[place] Patient Name: Samantha Golden Procedure Date: 12/13/2017 2:36 PM MRN: [id number] Endoscopist: Justice Britain , MD Age: 52 Referring MD:  Date of Birth: 1965-07-30 Gender: Female Account #: [id number] Procedure:                Colonoscopy Indications:              Screening for colorectal malignant neoplasm,                            Incidental - Chronic diarrhea Medicines:                Monitored Anesthesia Care Procedure:                Pre-Anesthesia Assessment:                           - Prior to the procedure, a History and Physical                            was performed, and patient medications and                            allergies were reviewed. The patient's tolerance of                            previous anesthesia was also reviewed. The risks                            and benefits of the procedure and the sedation                            options and risks were discussed with the patient.                            All questions were answered, and informed consent                            was obtained. Prior Anticoagulants: The patient has                            taken no previous anticoagulant or antiplatelet                            agents. ASA Grade Assessment: II - A patient with                            mild systemic disease. After reviewing the risks                            and benefits, the patient was deemed in                            satisfactory condition to undergo the procedure.  After obtaining informed consent, the colonoscope                            was passed under direct vision. Throughout the                            procedure, the patient's blood pressure, pulse, and                            oxygen saturations were monitored continuously. The                            Model PCF-H190DL [id number]) scope was introduced                            through the anus and advanced  to the 10 cm into the                            ileum. The colonoscopy was performed without                            difficulty. The patient tolerated the procedure.                            The quality of the bowel preparation was evaluated                            using the BBPS [facility] Bowel Preparation Scale)                            with scores of: Right Colon = 3, Transverse Colon =                            3 and Left Colon = 3 (entire mucosa seen well with                            no residual staining, small fragments of stool or                            opaque liquid). The total BBPS score equals 9. Scope In: 3:00:15 PM Scope Out: 3:19:02 PM Scope Withdrawal Time: 0 hours 15 minutes 27 seconds  Total Procedure Duration: 0 hours 18 minutes 47 seconds  Findings:                 The digital rectal exam findings include                            non-thrombosed external hemorrhoids. Pertinent                            negatives include no palpable rectal lesions.  The terminal ileum and ileocecal valve appeared                            normal. Biopsies were taken with a cold forceps for                            histology.                           Two sessile polyps were found in the rectum (1) and                            transverse colon (1). The polyps were 2 to 4 mm in                            size. These polyps were removed with a cold snare.                            Resection and retrieval were complete.                           Normal mucosa was found in the entire colon.                            Biopsies for histology were taken with a cold                            forceps from the cecum, ascending colon, transverse                            colon, descending colon, sigmoid colon and rectum                            for evaluation of microscopic colitis.                           Multiple small-mouthed diverticula were  found in                            the ascending colon.                           Non-bleeding non-thrombosed external and internal                            hemorrhoids were found during retroflexion. The                            hemorrhoids were Grade I (internal hemorrhoids that                            do not prolapse). Complications:            No immediate complications. Estimated Blood Loss:     Estimated blood loss was minimal.  Impression:               - Non-thrombosed external hemorrhoids found on                            digital rectal exam.                           - The examined portion of the ileum was normal.                            Biopsied.                           - Two 2 to 4 mm polyps in the rectum and in the                            transverse colon, removed with a cold snare.                            Resected and retrieved.                           - Normal mucosa in the entire examined colon.                            Biopsied.                           - Diverticulosis in the ascending colon.                           - Non-bleeding non-thrombosed external and internal                            hemorrhoids. Recommendation:           - The patient will be observed post-procedure,                            until all discharge criteria are met.                           - Discharge patient to home.                           - Patient has a contact number available for                            emergencies. The signs and symptoms of potential                            delayed complications were discussed with the                            patient. Return to normal activities tomorrow.  Written discharge instructions were provided to the                            patient.                           - High Fiber diet with Fibercon supplementation to                            bulk stools would be reasonable to  consider.                           - Continue present medications.                           - Await pathology results.                           - Repeat colonoscopy in 5-10 years for surveillance                            based on pathology results.                           - Return to GI clinic as previously scheduled in                            regards to workup of diarrhea.                           - The findings and recommendations were discussed                            with the patient.                           - The findings and recommendations were discussed                            with the designated responsible adult. Justice Britain, MD 12/13/2017 3:39:18 PM

## 2017-12-13 NOTE — Patient Instructions (Signed)
High fiber diet, diverticulosis and polyps info given. Fibercon suggested for additional fiber.  YOU HAD AN ENDOSCOPIC PROCEDURE TODAY AT [facility]:   Refer to the procedure report that was given to you for any specific questions about what was found during the examination.  If the procedure report does not answer your questions, please call your gastroenterologist to clarify.  If you requested that your care partner not be given the details of your procedure findings, then the procedure report has been included in a sealed envelope for you to review at your convenience later.  YOU SHOULD EXPECT: Some feelings of bloating in the abdomen. Passage of more gas than usual.  Walking can help get rid of the air that was put into your GI tract during the procedure and reduce the bloating. If you had a lower endoscopy (such as a colonoscopy or flexible sigmoidoscopy) you may notice spotting of blood in your stool or on the toilet paper. If you underwent a bowel prep for your procedure, you may not have a normal bowel movement for a few days.  Please Note:  You might notice some irritation and congestion in your nose or some drainage.  This is from the oxygen used during your procedure.  There is no need for concern and it should clear up in a day or so.  SYMPTOMS TO REPORT IMMEDIATELY:   Following lower endoscopy (colonoscopy or flexible sigmoidoscopy):  Excessive amounts of blood in the stool  Significant tenderness or worsening of abdominal pains  Swelling of the abdomen that is new, acute  Fever of 100F or higher   Following upper endoscopy (EGD)  Vomiting of blood or coffee ground material  New chest pain or pain under the shoulder blades  Painful or persistently difficult swallowing  New shortness of breath  Fever of 100F or higher  Black, tarry-looking stools  For urgent or emergent issues, a gastroenterologist can be reached at any hour by calling [phone]  [phone].   DIET:  We do recommend a small meal at first, but then you may proceed to your regular diet.  Drink plenty of fluids but you should avoid alcoholic beverages for 24 hours.  ACTIVITY:  You should plan to take it easy for the rest of today and you should NOT DRIVE or use heavy machinery until tomorrow (because of the sedation medicines used during the test).    FOLLOW UP: Our staff will call the number listed on your records the next business day following your procedure to check on you and address any questions or concerns that you may have regarding the information given to you following your procedure. If we do not reach you, we will leave a message.  However, if you are feeling well and you are not experiencing any problems, there is no need to return our call.  We will assume that you have returned to your regular daily activities without incident.  If any biopsies were taken you will be contacted by phone or by letter within the next 1-3 weeks.  Please call us at [phone] if you have not heard about the biopsies in 3 weeks.    SIGNATURES/CONFIDENTIALITY: You and/or your care partner have signed paperwork which will be entered into your electronic medical record.  These signatures attest to the fact that that the information above on your After Visit Summary has been reviewed and is understood.  Full responsibility of the confidentiality of this discharge information lies with you and/or  your care-partner. 

## 2017-12-13 NOTE — Progress Notes (Signed)
Called to room to assist during endoscopic procedure.  Patient ID and intended procedure confirmed with present staff. Received instructions for my participation in the procedure from the performing physician.  

## 2017-12-13 NOTE — Progress Notes (Signed)
Report given to PACU, vss 

## 2017-12-13 NOTE — Op Note (Signed)
[facility] Patient Name: Samantha Golden Procedure Date: 12/13/2017 2:36 PM MRN: [id number] Endoscopist: Justice Britain , MD Age: 52 Referring MD:  Date of Birth: 29-Aug-1965 Gender: Female Account #: [id number] Procedure:                Upper GI endoscopy Indications:              Abdominal pain, Diarrhea Medicines:                Monitored Anesthesia Care Procedure:                Pre-Anesthesia Assessment:                           - Prior to the procedure, a History and Physical                            was performed, and patient medications and                            allergies were reviewed. The patient's tolerance of                            previous anesthesia was also reviewed. The risks                            and benefits of the procedure and the sedation                            options and risks were discussed with the patient.                            All questions were answered, and informed consent                            was obtained. Prior Anticoagulants: The patient has                            taken no previous anticoagulant or antiplatelet                            agents. ASA Grade Assessment: II - A patient with                            mild systemic disease. After reviewing the risks                            and benefits, the patient was deemed in                            satisfactory condition to undergo the procedure.                           After obtaining informed consent, the endoscope was  passed under direct vision. Throughout the                            procedure, the patient's blood pressure, pulse, and                            oxygen saturations were monitored continuously. The                            Model GIF-[id number] scope was introduced                            through the mouth, and advanced to the second part                            of duodenum. The upper GI  endoscopy was                            accomplished without difficulty. The patient                            tolerated the procedure. Scope In: Scope Out: Findings:                 No gross lesions were noted in the entire esophagus.                           The Z-line was regular and was found 39 cm from the                            incisors.                           Multiple diminutive sessile polyps with no bleeding                            and no stigmata of recent bleeding were found in                            the gastric fundus and in the gastric body.                            Biopsies were taken from 3 polyps with a cold                            forceps for histology to rule out adenoma though                            most have appearance of fundic gland polyps.                           No gross lesions were noted in the entire examined  stomach. Biopsies were taken with a cold forceps                            for histology and Helicobacter pylori testing from                            the antrum/incisura/greater curve/lesser curve.                           No gross lesions were noted in the duodenal bulb,                            in the first portion of the duodenum and in the                            second portion of the duodenum. Biopsies for                            histology were taken with a cold forceps for                            evaluation of celiac disease and to rule out                            enteropathy. Complications:            No immediate complications. Estimated Blood Loss:     Estimated blood loss was minimal. Impression:               - No gross lesions in esophagus.                           - Z-line regular, 39 cm from the incisors.                           - Multiple gastric polyps. Biopsied to rule out                            adenoma.                           - No gross lesions in the  stomach. Biopsied for HP.                           - No gross lesions in the duodenal bulb, in the                            first portion of the duodenum and in the second                            portion of the duodenum. Biopsied for Celiac and                            Enteropathy evaluation. Recommendation:           -  Proceed with scheduled Colonoscopy.                           - Await pathology results.                           - Observe patient's clinical course.                           - The findings and recommendations were discussed                            with the patient.                           - The findings and recommendations were discussed                            with the designated responsible adult. Justice Britain, MD 12/13/2017 3:33:41 PM

## 2017-12-14 ENCOUNTER — Telehealth: Payer: Self-pay | Admitting: *Deleted

## 2017-12-14 ENCOUNTER — Telehealth: Payer: Self-pay

## 2017-12-14 NOTE — Telephone Encounter (Signed)
  Follow up Call-  Call back number 12/13/2017  Post procedure Call Back phone  # [phone]  Permission to leave phone message Yes  Some recent data might be hidden     Patient questions:  Do you have a fever, pain , or abdominal swelling? No. Pain Score  0 *  Have you tolerated food without any problems? Yes.    Have you been able to return to your normal activities? Yes.    Do you have any questions about your discharge instructions: Diet   No. Medications  No. Follow up visit  No.  Do you have questions or concerns about your Care? No.  Actions: * If pain score is 4 or above: No action needed, pain <4.

## 2017-12-14 NOTE — Telephone Encounter (Signed)
Message left

## 2017-12-18 ENCOUNTER — Encounter: Payer: Self-pay | Admitting: Gastroenterology

## 2017-12-19 ENCOUNTER — Telehealth: Payer: Self-pay

## 2017-12-19 NOTE — Telephone Encounter (Signed)
01/23/18 130 pm appt with Dr Rush Landmark for diarrhea f/u letter mailed to the pt

## 2017-12-19 NOTE — Telephone Encounter (Signed)
Samantha Golden, please work on a follow up in clinic in 3-6 weeks.

## 2017-12-19 NOTE — Progress Notes (Signed)
appt made and letter sent to the pt

## 2017-12-22 ENCOUNTER — Encounter: Payer: Self-pay | Admitting: *Deleted

## 2018-01-23 ENCOUNTER — Ambulatory Visit: Payer: BC Managed Care – PPO | Admitting: Gastroenterology

## 2018-01-23 ENCOUNTER — Encounter: Payer: Self-pay | Admitting: Gastroenterology

## 2018-01-23 VITALS — BP 98/66 | HR 76 | Ht 66.0 in | Wt 153.4 lb

## 2018-01-24 NOTE — Progress Notes (Signed)
The clinic had attempted to reach patient last week due to need for rescheduling and left a voicemail. Patient came into clinic for visit. Patient described situation by office staff and agreed to have a refund of her copayment. Patient was subsequently called by myself on the afternoon of 01/23/2018. Patient plan to send my chart message at which time I would communicate with her what next steps in evaluation may be based on her symptoms. Patient appreciated the call back.  Justice Britain, MD [facility] Office # [phone]

## 2018-03-02 ENCOUNTER — Other Ambulatory Visit: Payer: Self-pay | Admitting: Family Medicine

## 2018-03-02 DIAGNOSIS — J4521 Mild intermittent asthma with (acute) exacerbation: Secondary | ICD-10-CM

## 2018-03-04 ENCOUNTER — Other Ambulatory Visit: Payer: Self-pay | Admitting: Family Medicine

## 2018-03-04 DIAGNOSIS — J4521 Mild intermittent asthma with (acute) exacerbation: Secondary | ICD-10-CM

## 2018-03-06 MED ORDER — ALBUTEROL SULFATE 108 (90 BASE) MCG/ACT IN AEPB: 1.0000 | INHALATION_SPRAY | RESPIRATORY_TRACT | 0 refills | Status: DC | PRN

## 2018-03-06 NOTE — Telephone Encounter (Signed)
Please advise this rx have not been filled since 2018

## 2018-03-06 NOTE — Telephone Encounter (Signed)
Refilled albuterol once, but any further refills need office visit as last discussed in November 2018

## 2018-03-19 ENCOUNTER — Other Ambulatory Visit: Payer: Self-pay | Admitting: Urgent Care

## 2018-03-23 ENCOUNTER — Other Ambulatory Visit: Payer: Self-pay | Admitting: Orthopedic Surgery

## 2018-03-23 DIAGNOSIS — R52 Pain, unspecified: Secondary | ICD-10-CM

## 2018-03-23 DIAGNOSIS — R531 Weakness: Secondary | ICD-10-CM

## 2018-03-23 NOTE — Telephone Encounter (Signed)
No further refill without office visit °

## 2018-04-07 ENCOUNTER — Ambulatory Visit: Payer: BC Managed Care – PPO | Admitting: Internal Medicine

## 2018-04-07 ENCOUNTER — Encounter: Payer: Self-pay | Admitting: Internal Medicine

## 2018-04-07 VITALS — BP 110/70 | HR 75 | Ht 66.0 in | Wt 153.0 lb

## 2018-04-07 DIAGNOSIS — E063 Autoimmune thyroiditis: Secondary | ICD-10-CM | POA: Diagnosis not present

## 2018-04-07 DIAGNOSIS — E038 Other specified hypothyroidism: Secondary | ICD-10-CM | POA: Diagnosis not present

## 2018-04-07 LAB — T4, FREE: Free T4: 0.59 ng/dL — ABNORMAL LOW (ref 0.60–1.60)

## 2018-04-07 LAB — T3, FREE: T3, Free: 3.7 pg/mL (ref 2.3–4.2)

## 2018-04-07 LAB — TSH: TSH: 0.5 u[IU]/mL (ref 0.35–4.50)

## 2018-04-07 NOTE — Patient Instructions (Signed)
Please stop at the lab.  Please continue Armour 90 daily for now.  Take the thyroid hormone every day, with water, at least 30 minutes before breakfast, separated by at least 4 hours from: - acid reflux medications - calcium - iron - multivitamins  Please return in 6 months.

## 2018-04-07 NOTE — Progress Notes (Signed)
Patient ID: Samantha Golden, female   DOB: 1965/09/29, 53 y.o.   MRN: [id number]   HPI  Samantha Golden is a 53 y.o.-year-old female, initially referred by Dr Phineas Real, returning for f/u for uncontrolled Hashimoto's hypothyroidism. Last visit 6 months ago.  Reviewed history: Pt. has been dx with hypothyroidism in ~1990.   Previously on Synthroid DAW 225 mcg >> 300 mcg >> ... >> Currently on Armour  At last visit she was on Armour 90 mg alternating with 120 mg every other day.  In 09/2017 we had to decrease the dose to 90 mg daily.  Subsequent TSH was still slightly abnormal but improved.  Due to her symptoms, we continue the same dose of Armour at that time.  She takes the Armour: - in am - fasting - at least 30 min from b'fast - no Ca, Fe, PPIs - + MVI later in the day - not on Biotin  Reviewed patient's TFTs: Lab Results  Component Value Date   TSH 0.26 (L) 12/02/2017   TSH 0.05 (L) 10/03/2017   TSH 0.03 (L) 04/07/2017   TSH 0.174 (L) 09/15/2016   TSH 0.61 04/08/2016   TSH 0.05 (L) 05/12/2015   TSH 0.08 (L) 03/26/2015   TSH 4.84 (H) 09/03/2014   TSH 0.18 (L) 05/13/2014   TSH 58.73 (H) 04/08/2014   FREET4 0.62 12/02/2017   FREET4 0.65 10/03/2017   FREET4 0.92 04/07/2017   FREET4 1.04 09/15/2016   FREET4 1.08 03/26/2015   FREET4 0.39 (L) 09/03/2014   FREET4 0.64 05/13/2014   FREET4 0.84 04/08/2014   FREET4 1.75 (H) 12/06/2013  TSH 4.26 in 2012. She has been out of the LT4 for ~ 1 mo before the test in 10/2013.  She had positive TPO antibodies, confirming Hashimoto's thyroiditis: Component     Latest Ref Rng & Units 10/23/2013  Thyroperoxidase Ab SerPl-aCnc     <35.0 IU/mL 265.0 (H)  Thyroglobulin Ab     <40.0 IU/mL 38.2   She got palpitations when she took Adderall with the LT4 300 mcg.  Pt denies: - feeling nodules in neck - hoarseness - choking - SOB with lying down + Occasional dysphagia.  ROS: Constitutional: + weight gain/no weight loss, + fatigue, +  subjective hyperthermia, no subjective hypothermia Eyes: no blurry vision, no xerophthalmia ENT: no sore throat, + see HPI Cardiovascular: no CP/no SOB/no palpitations/no leg swelling Respiratory: no cough/no SOB/no wheezing Gastrointestinal: + N (improved)/no V/+ D/+ C/no acid reflux Musculoskeletal: no muscle aches/+ joint aches Skin: no rashes, no hair loss Neurological: no tremors/no numbness/no tingling/no dizziness  I reviewed pt's medications, allergies, PMH, social hx, family hx, and changes were documented in the history of present illness. Otherwise, unchanged from my initial visit note.  Past Medical History:  Diagnosis Date  . Abnormal cervical Papanicolaou smear    ? LASER TREATMENT   . Allergy   . Anxiety   . BRCA negative 08/09  . Cancer ([place])    skin ca  . Cervical dysplasia   . Hashimoto's disease    Past Surgical History:  Procedure Laterality Date  . ADENOIDECTOMY    . CERVIX LESION DESTRUCTION    . COLPOSCOPY    . INTRAUTERINE DEVICE (IUD) INSERTION  09/04/2014   Mirena   History   Social History  . Marital Status: Divorced    Spouse Name: N/A    Number of Children: 3   Occupational History  . teacher   Social History Main Topics  . Smoking  status: Former Research scientist (life sciences)  . Smokeless tobacco: Never Used  . Alcohol Use: 1.0 oz/week    2 drink(s) per week  . Drug Use: No  . Sexual Activity: Yes    Birth Control/ Protection: IUD     Comment: ParaGard 08/2012   Current Outpatient Medications on File Prior to Visit  Medication Sig Dispense Refill  . Albuterol Sulfate (PROAIR RESPICLICK) 024 (90 Base) MCG/ACT AEPB Inhale 1 Inhaler into the lungs every 4 (four) hours as needed. 1 each 0  . ARMOUR THYROID 90 MG tablet Take 1 tablet (90 mg total) by mouth daily. 90 tablet 3  . estradiol (ESTRACE) 1 MG tablet TAKE 1 TABLET ONCE DAILY. (Patient taking differently: Take 1 mg by mouth as needed. ) 30 tablet 6  . FLOVENT HFA 44 MCG/ACT inhaler USE 2 PUFFS TWICE A  DAY. 10.6 g 0  . PROAIR HFA 108 (90 Base) MCG/ACT inhaler INHALE 1-2 PUFFS INTO THE LUNGS EVERY SIX HOURS AS NEEDED FOR WHEEZING OR SHORTNESS OF BREATH. 8.5 g 0   Current Facility-Administered Medications on File Prior to Visit  Medication Dose Route Frequency Provider Last Rate Last Dose  . levonorgestrel (MIRENA) 20 MCG/24HR IUD   Intrauterine Once Fontaine, Belinda Block, MD       Allergies  Allergen Reactions  . Latex Shortness Of Breath, Itching and Rash    Wheezing when she removes latex gloves after working, does not use anything latex !   . Augmentin [Amoxicillin-Pot Clavulanate]   . Other     "CLEANING MATERIALS" (bleach, ammonia)   Family History  Problem Relation Age of Onset  . Breast cancer Mother        Age 68  . Breast cancer Maternal Grandmother        Age 18's  . Diabetes Maternal Grandfather   . Alcohol abuse Brother   . Cancer Paternal Aunt        Cervical  . Diabetes Paternal Uncle   . Breast cancer Paternal Aunt        Age 59's  . Colon cancer Neg Hx   . Esophageal cancer Neg Hx   . Stomach cancer Neg Hx   . Pancreatic cancer Neg Hx   . Liver disease Neg Hx   . Inflammatory bowel disease Neg Hx    PE: BP 110/70   Pulse 75   Ht _0  (1.676 m) Comment: measured  Wt 153 lb (69.4 kg)   SpO2 97%   BMI 24.69 kg/m  Body mass index is 24.69 kg/m.  Wt Readings from Last 3 Encounters:  04/07/18 153 lb (69.4 kg)  01/23/18 153 lb 6 oz (69.6 kg)  12/13/17 150 lb (68 kg)   Constitutional: Normal weight, in NAD Eyes: PERRLA, EOMI, no exophthalmos ENT: moist mucous membranes, no thyromegaly, no cervical lymphadenopathy Cardiovascular: RRR, No MRG Respiratory: CTA B Gastrointestinal: abdomen soft, NT, ND, BS+ Musculoskeletal: no deformities, strength intact in all 4 Skin: moist, warm, no rashes Neurological: + fine tremor with outstretched hands, DTR normal in all 4  ASSESSMENT: 1. Hashimoto's Hypothyroidism - did not feel good on Synthroid  PLAN:  1.  Patient with longstanding, uncontrolled, Hashimoto's hypothyroidism, now on Armour Thyroid.  She did try levothyroxine generic and Synthroid d.a.w. and did not feel good on this.  She felt better on Armour, but continuing to have some nonspecific symptoms possibly related to menopause.  At last visit she was on 90 mg of Armour alternating with 120 mg every other day.  As TSH was suppressed, we decreased the dose to only 90 mg of Armour in 09/2017.  Subsequent labs obtained in 11/2017 showed an improved TSH, but still suppressed.  However, based on her symptoms, we continue the same dose of Armour pending repeat at this visit. - We discussed again today about possible risks of oral replacement with Armour: Palpitations, tremors, anxiety, heat intolerance, insomnia, weight loss, but in the long run: Arrhythmia, hypercoagulability with risk of strokes and heart attacks, osteoporosis.  She reluctantly agreed to continue to adjust the doses of her thyroid medication until we get to the normal range. - As of now, she continues Armour 90 mg daily. - pt feels good on this dose, but continues to experience fatigue, especially in the last few months, and also few pounds weight gain.  We discussed that the symptoms may be related to her perimenopause.  The only way to tell whether the symptoms are coming from the thyroid is to bring the thyroid in the normal range and then see if they improve.  I explained that we will need to do this slowly so she does not feel more tired when we continue to decrease the doses of her Armour. - we discussed about taking the thyroid hormone every day, with water, >30 minutes before breakfast, separated by >4 hours from acid reflux medications, calcium, iron, multivitamins. Pt. is taking it correctly. - will check thyroid tests today: TSH, free T3, and fT4 - If labs are abnormal, she will need to return for repeat TFTs in 1.5 months - I will see her back in 6 months  - time spent with  the patient: 25 minutes, of which >50% was spent in obtaining information about her symptoms, reviewing her previous labs, evaluations, and treatments, counseling her about her condition (please see the discussed topics above), and developing a plan to further investigate and treat it; she had a number of questions which I addressed.  Office Visit on 04/07/2018  Component Date Value Ref Range Status  . TSH 04/07/2018 0.50  0.35 - 4.50 uIU/mL Final  . Free T4 04/07/2018 0.59* 0.60 - 1.60 ng/dL Final   Comment: Specimens from patients who are undergoing biotin therapy and /or ingesting biotin supplements may contain high levels of biotin.  The higher biotin concentration in these specimens interferes with this Free T4 assay.  Specimens that contain high levels  of biotin may cause false high results for this Free T4 assay.  Please interpret results in light of the total clinical presentation of the patient.    . T3, Free 04/07/2018 3.7  2.3 - 4.2 pg/mL Final   TFTs at goal.  Philemon Kingdom, MD PhD [facility]

## 2018-04-12 ENCOUNTER — Other Ambulatory Visit: Payer: Self-pay | Admitting: Family Medicine

## 2018-04-12 DIAGNOSIS — J4521 Mild intermittent asthma with (acute) exacerbation: Secondary | ICD-10-CM

## 2018-04-12 NOTE — Telephone Encounter (Signed)
Requested medication (s) are due for refill today: Yes  Requested medication (s) are on the active medication list: Yes  Last refill:  03/02/18  Future visit scheduled: No  Notes to clinic:  See request    Requested Prescriptions  Pending Prescriptions Disp Refills   FLOVENT HFA 44 MCG/ACT inhaler [Pharmacy Med Name: FLOVENT HFA 44 MCG INHALER] 10.6 g 0    Sig: USE 2 PUFFS TWICE A DAY.     Pulmonology:  Corticosteroids Passed - 04/12/2018 10:38 AM      Passed - Valid encounter within last 12 months    Recent Outpatient Visits          7 months ago Chronic bilateral low back pain without sciatica   [facility] at [facility], [place] D, PA-C   1 year ago Wheezing   [facility] at [facility], Ranell Patrick, MD   1 year ago Chronic abdominal pain   [facility] at [facility], [place] D, PA-C   2 years ago Cough   [facility] at [facility], [place] D, PA-C   2 years ago Chest pain, unspecified chest pain type   [facility] Care at [facility], Benjaman Pott, [place]

## 2018-04-17 ENCOUNTER — Other Ambulatory Visit: Payer: Self-pay | Admitting: *Deleted

## 2018-04-17 DIAGNOSIS — J4521 Mild intermittent asthma with (acute) exacerbation: Secondary | ICD-10-CM

## 2018-04-17 MED ORDER — FLUTICASONE PROPIONATE HFA 44 MCG/ACT IN AERO: INHALATION_SPRAY | RESPIRATORY_TRACT | 0 refills | Status: DC

## 2018-05-08 ENCOUNTER — Ambulatory Visit (INDEPENDENT_AMBULATORY_CARE_PROVIDER_SITE_OTHER): Payer: BC Managed Care – PPO | Admitting: Family Medicine

## 2018-05-08 DIAGNOSIS — Z5321 Procedure and treatment not carried out due to patient leaving prior to being seen by health care provider: Secondary | ICD-10-CM

## 2018-05-08 NOTE — Progress Notes (Signed)
Left w/o being seen

## 2018-08-10 ENCOUNTER — Encounter: Payer: Self-pay | Admitting: Internal Medicine

## 2018-08-11 ENCOUNTER — Other Ambulatory Visit: Payer: Self-pay | Admitting: Internal Medicine

## 2018-08-11 DIAGNOSIS — E038 Other specified hypothyroidism: Secondary | ICD-10-CM

## 2018-08-11 DIAGNOSIS — E063 Autoimmune thyroiditis: Secondary | ICD-10-CM

## 2018-08-17 ENCOUNTER — Encounter (HOSPITAL_COMMUNITY): Payer: Self-pay

## 2018-08-17 ENCOUNTER — Other Ambulatory Visit: Payer: Self-pay

## 2018-08-17 ENCOUNTER — Ambulatory Visit (INDEPENDENT_AMBULATORY_CARE_PROVIDER_SITE_OTHER): Payer: BC Managed Care – PPO

## 2018-08-17 ENCOUNTER — Ambulatory Visit (HOSPITAL_COMMUNITY)
Admission: EM | Admit: 2018-08-17 | Discharge: 2018-08-17 | Disposition: A | Discharge status: Home / Self Care | Payer: BC Managed Care – PPO | Attending: Family Medicine | Admitting: Family Medicine

## 2018-08-17 DIAGNOSIS — S20212A Contusion of left front wall of thorax, initial encounter: Secondary | ICD-10-CM | POA: Diagnosis not present

## 2018-08-17 MED ORDER — HYDROCODONE-ACETAMINOPHEN 5-325 MG PO TABS: 1.0000 | ORAL_TABLET | Freq: Four times a day (QID) | ORAL | 0 refills | Status: DC | PRN

## 2018-08-17 NOTE — Discharge Instructions (Signed)
Be aware, pain medications may cause drowsiness. Please do not drive, operate heavy machinery or make important decisions while on this medication, it can cloud your judgement.  

## 2018-08-17 NOTE — ED Provider Notes (Signed)
[facility]   [id number] 08/17/18 Arrival Time: 7341 [N4]  ASSESSMENT & PLAN:  1. Rib contusion, left, initial encounter    I have personally viewed the imaging studies ordered this visit. No pneumothorax. No obvious rib fractures.  Meds ordered this encounter  Medications  . HYDROcodone-acetaminophen (NORCO/VICODIN) 5-325 MG tablet    Sig: Take 1 tablet by mouth every 6 (six) hours as needed for moderate pain or severe pain.    Dispense:  12 tablet    Refill:  0   [place] Controlled Substances Registry consulted for this patient. I feel the risk/benefit ratio today is favorable for proceeding with this prescription for a controlled substance. Medication sedation precautions given.  Encouraged deep breaths.  Follow-up Information    Huel Cote, NP.   Specialties: Obstetrics and Gynecology, Radiology Why: As needed. Contact information: [place] [phone]        [place].   Specialty: Urgent Care Why: As needed. Contact information: [place] [phone]          Reviewed expectations re: course of current medical issues. Questions answered. Outlined signs and symptoms indicating need for more acute intervention. Patient verbalized understanding. After Visit Summary given.  SUBJECTIVE: History from: patient. Samantha Golden is a 53 y.o. female who reports persistent marked pain of her left upper posterior ribs; described as aching without radiation. Onset: abrupt, two evenings ago. Injury/trama: yes, reports falling onto a table with immediate pain over left ribs. Symptoms have progressed to a point and plateaued since beginning. Aggravating factors: deep breaths and certain movements. Alleviating factors: "not really anything". Associated symptoms: none reported. No specific CP or SOB reported. Normal PO intake without  n/v. Extremity sensation changes or weakness: none. Self treatment: tried OTCs without relief of pain. History of similar: no.  Social History   Tobacco Use  Smoking Status Former Smoker  . Years: 10.00  . Types: Cigarettes  Smokeless Tobacco Never Used   Past Surgical History:  Procedure Laterality Date  . ADENOIDECTOMY    . CERVIX LESION DESTRUCTION    . COLPOSCOPY    . INTRAUTERINE DEVICE (IUD) INSERTION  09/04/2014   Mirena    ROS: As per HPI. All other systems negative.   OBJECTIVE:  Vitals:   08/17/18 1140  BP: (!) 135/92  Pulse: 85  Resp: 18  Temp: 98.2 F (36.8 C)  TempSrc: Oral  SpO2: 100%    General appearance: alert; no distress but appears to be in discomfort HEENT: [place]; AT Neck: supple with FROM CV: RRR Resp: unlabored respirations; CTAB Chest wall: very tender to palpation over lower upper posterior and lateral ribs; no gross abnormalities; no bruising or open wounds Abd: soft; non-tender Extremities: moving all extremities normally; without gross abnormalities Skin: warm and dry; no visible rashes Neurologic: gait normal; normal sensation of RUE, LUE, RLE and LLE; normal strength of RUE, LUE, RLE and LLE Psychological: alert and cooperative; normal mood and affect  Imaging: Dg Ribs Unilateral W/chest Left  Result Date: 08/17/2018 CLINICAL DATA:  Pain following recent fall EXAM: LEFT RIBS AND CHEST - 3+ VIEW COMPARISON:  January 02, 2016 chest radiograph FINDINGS: Frontal chest as well as oblique and cone-down rib images were obtained. Lungs are clear. Heart size and pulmonary vascularity are normal. No adenopathy. There is no evident pneumothorax or pleural effusion. No evident rib fractures. IMPRESSION: No evident rib fracture.  Lungs clear. Electronically Signed   By: Lowella Grip III M.D.   On: 08/17/2018 12:22    Allergies  Allergen Reactions  . Latex Shortness Of Breath, Itching and Rash    Wheezing when she removes latex gloves after  working, does not use anything latex !   . Augmentin [Amoxicillin-Pot Clavulanate]   . Other     "CLEANING MATERIALS" (bleach, ammonia)    Past Medical History:  Diagnosis Date  . Abnormal cervical Papanicolaou smear    ? LASER TREATMENT   . Allergy   . Anxiety   . BRCA negative 08/09  . Cancer ([place])    skin ca  . Cervical dysplasia   . Hashimoto's disease    Social History   Socioeconomic History  . Marital status: Divorced    Spouse name: Not on file  . Number of children: 3  . Years of education: Not on file  . Highest education level: Not on file  Occupational History  . Not on file  Social Needs  . Financial resource strain: Not on file  . Food insecurity    Worry: Not on file    Inability: Not on file  . Transportation needs    Medical: Not on file    Non-medical: Not on file  Tobacco Use  . Smoking status: Former Smoker    Years: 10.00    Types: Cigarettes  . Smokeless tobacco: Never Used  Substance and Sexual Activity  . Alcohol use: Yes    Alcohol/week: 28.0 - 42.0 standard drinks    Types: 28 - 42 Shots of liquor per week    Frequency: Never    Comment: 4-6 drinks daily  . Drug use: No  . Sexual activity: Yes    Birth control/protection: I.U.D.    Comment: MIRENA 09/04/2014- first sexual encounter 20 , fewer than 5 partners  Lifestyle  . Physical activity    Days per week: Not on file    Minutes per session: Not on file  . Stress: Not on file  Relationships  . Social Herbalist on phone: Not on file    Gets together: Not on file    Attends religious service: Not on file    Active member of club or organization: Not on file    Attends meetings of clubs or organizations: Not on file    Relationship status: Not on file  Other Topics Concern  . Not on file  Social History Narrative  . Not on file   Family History  Problem Relation Age of Onset  . Breast cancer Mother        Age 2  . Breast cancer Maternal Grandmother         Age 9's  . Diabetes Maternal Grandfather   . Healthy Father   . Alcohol abuse Brother   . Cancer Paternal Aunt        Cervical  . Diabetes Paternal Uncle   . Breast cancer Paternal Aunt        Age 22's  . Colon cancer Neg Hx   . Esophageal cancer Neg Hx   . Stomach cancer Neg Hx   . Pancreatic cancer Neg Hx   . Liver disease Neg Hx   . Inflammatory bowel disease Neg Hx    Past Surgical History:  Procedure Laterality Date  . ADENOIDECTOMY    . CERVIX LESION DESTRUCTION    . COLPOSCOPY    . INTRAUTERINE DEVICE (IUD) INSERTION  09/04/2014  Bonita Quin, MD 08/17/18 [phone]
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
## 2018-09-15 ENCOUNTER — Other Ambulatory Visit: Payer: Self-pay

## 2018-09-18 ENCOUNTER — Ambulatory Visit (HOSPITAL_COMMUNITY): Payer: BC Managed Care – PPO | Admitting: Licensed Clinical Social Worker

## 2018-09-18 ENCOUNTER — Ambulatory Visit (HOSPITAL_COMMUNITY): Payer: BC Managed Care – PPO | Admitting: Psychology

## 2018-09-18 ENCOUNTER — Ambulatory Visit: Payer: BC Managed Care – PPO | Admitting: Women's Health

## 2018-09-18 ENCOUNTER — Encounter: Payer: Self-pay | Admitting: Women's Health

## 2018-09-18 ENCOUNTER — Other Ambulatory Visit: Payer: Self-pay

## 2018-09-18 VITALS — BP 122/78 | Ht 66.0 in | Wt 150.0 lb

## 2018-09-18 DIAGNOSIS — E038 Other specified hypothyroidism: Secondary | ICD-10-CM | POA: Diagnosis not present

## 2018-09-18 DIAGNOSIS — Z13220 Encounter for screening for lipoid disorders: Secondary | ICD-10-CM | POA: Diagnosis not present

## 2018-09-18 DIAGNOSIS — Z01419 Encounter for gynecological examination (general) (routine) without abnormal findings: Secondary | ICD-10-CM | POA: Diagnosis not present

## 2018-09-18 NOTE — Addendum Note (Signed)
Addended by: Lorine Bears on: 09/18/2018 10:43 AM   Modules accepted: Orders

## 2018-09-18 NOTE — Progress Notes (Signed)
Samantha Golden December 07, 1965 [id number]    History:    Presents for annual exam.  08/2014 Mirena IUD on no HRT with elevated FSH (2019) and no bleeding.  Mother, maternal grandmother breast cancer,  patient is BRCA negative.  2019 small benign colon polyp, 10-year follow-up.  Normal Pap and mammogram history.   Hypothyroid and asthma.  Past medical history, past surgical history, family history and social history were all reviewed and documented in the EPIC chart.  First grade teacher.  3 sons.  Partner has 3 children ages 67 through 4.  ROS:  A ROS was performed and pertinent positives and negatives are included.  Exam:  Vitals:   09/18/18 0945  BP: 122/78  Weight: 150 lb (68 kg)  Height: '5\' 6"'  (1.676 m)   Body mass index is 24.21 kg/m.   General appearance:  Normal Thyroid:  Symmetrical, normal in size, without palpable masses or nodularity. Respiratory  Auscultation:  Clear without wheezing or rhonchi Cardiovascular  Auscultation:  Regular rate, without rubs, murmurs or gallops  Edema/varicosities:  Not grossly evident Abdominal  Soft,nontender, without masses, guarding or rebound.  Liver/spleen:  No organomegaly noted  Hernia:  None appreciated  Skin  Inspection:  Grossly normal   Breasts: Examined lying and sitting.     Right: Without masses, retractions, discharge or axillary adenopathy.     Left: Without masses, retractions, discharge or axillary adenopathy. Gentitourinary   Inguinal/mons:  Normal without inguinal adenopathy  External genitalia:  Normal  BUS/Urethra/Skene's glands:  Normal  Vagina:  Normal  Cervix:  Normal IUD strings visible  Uterus:  normal in size, shape and contour.  Midline and mobile  Adnexa/parametria:     Rt: Without masses or tenderness.   Lt: Without masses or tenderness.  Anus and perineum: Normal  Digital rectal exam: Normal sphincter tone without palpated masses or tenderness  Assessment/Plan:  53 y.o. D WF G3, P3 for annual exam  with no GYN complaints, continues with left-sided rib pain from fall last month, negative x-ray.  08/2014 Mirena IUD/postmenopausal with no bleeding Hypothyroid-endocrinologist manages Asthma-primary care manages  Plan: SBEs, continue annual 3D screening mammogram, will get next month after rib pain decreased.  Continue healthy lifestyle of regular exercise, kayaks, calcium rich foods, vitamin D 2000 daily encouraged.  Reviewed will take out Mirena IUD next year, reviewed protective of endometrium.  CBC, CMP, TSH, T3, T4, lipid panel, Pap per request, 2018 Pap ASCUS with negative HR HPV.  Will have follow-up with endocrinologist after lab results.    Huel Cote [facility], 9:50 AM 09/18/2018

## 2018-09-18 NOTE — Patient Instructions (Signed)

## 2018-09-19 ENCOUNTER — Other Ambulatory Visit: Payer: Self-pay | Admitting: Women's Health

## 2018-09-19 DIAGNOSIS — R748 Abnormal levels of other serum enzymes: Secondary | ICD-10-CM

## 2018-09-19 LAB — COMPREHENSIVE METABOLIC PANEL
AG Ratio: 1.6 (calc) (ref 1.0–2.5)
ALT: 167 U/L — ABNORMAL HIGH (ref 6–29)
AST: 157 U/L — ABNORMAL HIGH (ref 10–35)
Albumin: 4.4 g/dL (ref 3.6–5.1)
Alkaline phosphatase (APISO): 82 U/L (ref 37–153)
BUN/Creatinine Ratio: 12 (calc) (ref 6–22)
BUN: 6 mg/dL — ABNORMAL LOW (ref 7–25)
CO2: 26 mmol/L (ref 20–32)
Calcium: 10 mg/dL (ref 8.6–10.4)
Chloride: 107 mmol/L (ref 98–110)
Creat: 0.52 mg/dL (ref 0.50–1.05)
Globulin: 2.8 g/dL (calc) (ref 1.9–3.7)
Glucose, Bld: 90 mg/dL (ref 65–99)
Potassium: 3.6 mmol/L (ref 3.5–5.3)
Sodium: 141 mmol/L (ref 135–146)
Total Bilirubin: 0.6 mg/dL (ref 0.2–1.2)
Total Protein: 7.2 g/dL (ref 6.1–8.1)

## 2018-09-19 LAB — CBC WITH DIFFERENTIAL/PLATELET
Absolute Monocytes: 479 cells/uL (ref 200–950)
Basophils Absolute: 28 cells/uL (ref 0–200)
Basophils Relative: 0.6 %
Eosinophils Absolute: 160 cells/uL (ref 15–500)
Eosinophils Relative: 3.4 %
HCT: 48.2 % — ABNORMAL HIGH (ref 35.0–45.0)
Hemoglobin: 16.3 g/dL — ABNORMAL HIGH (ref 11.7–15.5)
Lymphs Abs: 1161 cells/uL (ref 850–3900)
MCH: 33.5 pg — ABNORMAL HIGH (ref 27.0–33.0)
MCHC: 33.8 g/dL (ref 32.0–36.0)
MCV: 99.2 fL (ref 80.0–100.0)
MPV: 9.8 fL (ref 7.5–12.5)
Monocytes Relative: 10.2 %
Neutro Abs: 2872 cells/uL (ref 1500–7800)
Neutrophils Relative %: 61.1 %
Platelets: 215 10*3/uL (ref 140–400)
RBC: 4.86 10*6/uL (ref 3.80–5.10)
RDW: 12.1 % (ref 11.0–15.0)
Total Lymphocyte: 24.7 %
WBC: 4.7 10*3/uL (ref 3.8–10.8)

## 2018-09-19 LAB — T3 UPTAKE: T3 Uptake: 29 % (ref 22–35)

## 2018-09-19 LAB — LIPID PANEL
Cholesterol: 229 mg/dL — ABNORMAL HIGH (ref <200)
HDL: 73 mg/dL (ref >=50)
LDL Cholesterol (Calc): 135 mg/dL (calc) — ABNORMAL HIGH
Non-HDL Cholesterol (Calc): 156 mg/dL (calc) — ABNORMAL HIGH (ref <130)
Total CHOL/HDL Ratio: 3.1 (calc) (ref <5.0)
Triglycerides: 106 mg/dL (ref <150)

## 2018-09-19 LAB — TSH: TSH: 0.79 mIU/L

## 2018-09-19 LAB — T4
Free Thyroxine Index: 1.7 (ref 1.4–3.8)
T4, Total: 5.9 ug/dL (ref 5.1–11.9)

## 2018-09-20 ENCOUNTER — Ambulatory Visit (HOSPITAL_COMMUNITY): Payer: BC Managed Care – PPO | Admitting: Psychology

## 2018-09-20 ENCOUNTER — Other Ambulatory Visit: Payer: Self-pay

## 2018-09-20 DIAGNOSIS — F1020 Alcohol dependence, uncomplicated: Secondary | ICD-10-CM

## 2018-09-21 ENCOUNTER — Encounter (HOSPITAL_COMMUNITY): Payer: Self-pay | Admitting: Psychology

## 2018-09-21 DIAGNOSIS — F1020 Alcohol dependence, uncomplicated: Secondary | ICD-10-CM | POA: Insufficient documentation
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
## 2018-09-22 ENCOUNTER — Telehealth: Payer: Self-pay | Admitting: Internal Medicine

## 2018-09-22 ENCOUNTER — Encounter: Payer: Self-pay | Admitting: Women's Health

## 2018-09-22 LAB — PAP IG W/ RFLX HPV ASCU

## 2018-09-22 LAB — HUMAN PAPILLOMAVIRUS, HIGH RISK: HPV DNA High Risk: NOT DETECTED

## 2018-09-22 NOTE — Telephone Encounter (Signed)
Called patient to pre-screen for visit on Tuesday. Patient advised re: she had blood work done and Dr.Gherghe messaged her informing results where great. Patient states she feels great and doesn't feel appointment is necessary.  Would you like to push out appointment?

## 2018-09-22 NOTE — Telephone Encounter (Signed)
Okay for 6 months

## 2018-09-26 ENCOUNTER — Ambulatory Visit: Payer: Self-pay | Admitting: Internal Medicine

## 2018-10-14 ENCOUNTER — Ambulatory Visit (HOSPITAL_COMMUNITY)
Admission: EM | Admit: 2018-10-14 | Discharge: 2018-10-14 | Disposition: A | Discharge status: Home / Self Care | Payer: BC Managed Care – PPO | Attending: Family Medicine | Admitting: Family Medicine

## 2018-10-14 ENCOUNTER — Encounter (HOSPITAL_COMMUNITY): Payer: Self-pay

## 2018-10-14 ENCOUNTER — Other Ambulatory Visit: Payer: Self-pay

## 2018-10-14 DIAGNOSIS — K047 Periapical abscess without sinus: Secondary | ICD-10-CM

## 2018-10-14 MED ORDER — TRAMADOL HCL 50 MG PO TABS: 50.0000 mg | ORAL_TABLET | Freq: Two times a day (BID) | ORAL | 0 refills | Status: DC | PRN

## 2018-10-14 MED ORDER — CLINDAMYCIN HCL 150 MG PO CAPS: 450.0000 mg | ORAL_CAPSULE | Freq: Three times a day (TID) | ORAL | 0 refills | Status: AC

## 2018-10-14 NOTE — Discharge Instructions (Signed)
Warm compresses.  Ibuprofen and tylenol alternating. Don't take additional aleve.  Tramadol for breakthrough pain, may help with sleep.  Follow up with your oral surgeon for definitive treatment.  Complete course of antibiotics.

## 2018-10-14 NOTE — ED Triage Notes (Signed)
Patient presents to Urgent Care with complaints of tooth abscess since about 4 days ago. Patient reports her dentist told her to come here and get an antibiotic while she is waiting for her dental appt.

## 2018-10-14 NOTE — ED Provider Notes (Signed)
[place]    CSN: [id number] Arrival date & time: 10/14/18  1324      History   Chief Complaint Chief Complaint  Patient presents with  . Appointment    1:30  . Dental Pain    HPI Samantha Golden is a 53 y.o. female.   Samantha Golden presents with complaints of dental pain with facial swelling. Started maybe yesterday but much worse today. Woke with significant left sided facial swelling. She has a known broken tooth, was scheduled to have it removed but had to cancel. She did take two old amoxicillin she had at home before realizing it was expired. Has been taking ibuprofen and tylenol for pain which hasn't helped with pain. No fevers. States she has had a hard time sleeping due to pain. History  Of cervical dysplasia, adhd, hypothyroidism.     ROS per HPI, negative if not otherwise mentioned.      Past Medical History:  Diagnosis Date  . Abnormal cervical Papanicolaou smear    ? LASER TREATMENT   . Allergy   . Anxiety   . BRCA negative 08/09  . Cancer ([place])    skin ca  . Cervical dysplasia   . Hashimoto's disease     Patient Active Problem List   Diagnosis Date Noted  . Alcohol use disorder, severe, dependence ([place]) 09/21/2018  . Chronic diarrhea 11/08/2017  . Lower abdominal pain 11/08/2017  . Bloating 11/08/2017  . Generalized postprandial abdominal pain 11/08/2017  . Colon cancer screening 11/08/2017  . Perimenopause 04/07/2017  . BV (bacterial vaginosis) 12/06/2016  . 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 Hypothyroidism due to Hashimoto's thyroiditis     Past Surgical History:  Procedure Laterality Date  . ADENOIDECTOMY    . CERVIX LESION DESTRUCTION    . COLPOSCOPY    . INTRAUTERINE DEVICE (IUD) INSERTION  09/04/2014   Mirena    OB History    Gravida  3   Para  3   Term  3   Preterm      AB      Living  3     SAB      TAB      Ectopic      Multiple      Live Births             Home Medications    Prior to Admission medications   Medication Sig Start Date End Date Taking? Authorizing Provider  Albuterol Sulfate (PROAIR RESPICLICK) 448 (90 Base) MCG/ACT AEPB Inhale 1 Inhaler into the lungs every 4 (four) hours as needed. 03/06/18   Wendie Agreste, MD  ARMOUR THYROID 90 MG tablet Take 1 tablet (90 mg total) by mouth daily. 10/03/17   Philemon Kingdom, MD  clindamycin (CLEOCIN) 150 MG capsule Take 3 capsules (450 mg total) by mouth 3 (three) times daily for 7 days. 10/14/18 10/21/18  Zigmund Gottron, NP  FLOVENT HFA 44 MCG/ACT inhaler USE 2 PUFFS TWICE A DAY. 04/17/18   Rutherford Guys, MD  fluticasone [facility] HFA) 44 MCG/ACT inhaler USE 2 PUFFS TWICE A DAY. 04/17/18   Wendie Agreste, MD  PROAIR HFA 108 [phone] Base) MCG/ACT inhaler INHALE 1-2 PUFFS INTO THE LUNGS EVERY SIX HOURS AS NEEDED FOR WHEEZING OR SHORTNESS OF BREATH. 03/23/18   Rutherford Guys, MD  traMADol (ULTRAM) 50 MG tablet Take 1 tablet (50 mg total) by mouth every 12 (twelve) hours as needed for severe  pain. 10/14/18   Zigmund Gottron, NP    Family History Family History  Problem Relation Age of Onset  . Breast cancer Mother        Age 48  . Breast cancer Maternal Grandmother        Age 18's  . Diabetes Maternal Grandfather   . Healthy Father   . Alcohol abuse Brother   . Cancer Paternal Aunt        Cervical  . Diabetes Paternal Uncle   . Breast cancer Paternal Aunt        Age 68's  . Colon cancer Neg Hx   . Esophageal cancer Neg Hx   . Stomach cancer Neg Hx   . Pancreatic cancer Neg Hx   . Liver disease Neg Hx   . Inflammatory bowel disease Neg Hx     Social History Social History   Tobacco Use  . Smoking status: Current Every Day Smoker    Packs/day: 0.25    Years: 10.00    Pack years: 2.50    Types: Cigarettes  . Smokeless tobacco: Never Used  Substance Use Topics  . Alcohol use: Yes    Alcohol/week: 28.0 - 42.0 standard drinks    Types: 28 - 42 Shots of  liquor per week    Frequency: Never    Comment: 5 to 6 drinks per day  . Drug use: No     Allergies   Latex, Augmentin [amoxicillin-pot clavulanate], and Other   Review of Systems Review of Systems   Physical Exam Triage Vital Signs ED Triage Vitals  Enc Vitals Group     BP 10/14/18 1351 (!) 133/92     Pulse Rate 10/14/18 1351 78     Resp 10/14/18 1351 16     Temp 10/14/18 1351 98.4 F (36.9 C)     Temp Source 10/14/18 1351 Oral     SpO2 10/14/18 1351 98 %     Weight --      Height --      Head Circumference --      Peak Flow --      Pain Score 10/14/18 1349 6     Pain Loc --      Pain Edu? --      Excl. in [place]? --    No data found.  Updated Vital Signs BP (!) 133/92 (BP Location: Right Arm)   Pulse 78   Temp 98.4 F (36.9 C) (Oral)   Resp 16   SpO2 98%   Visual Acuity Right Eye Distance:   Left Eye Distance:   Bilateral Distance:    Right Eye Near:   Left Eye Near:    Bilateral Near:     Physical Exam Constitutional:      General: She is not in acute distress.    Appearance: She is well-developed.  HENT:     Mouth/Throat:     Dentition: Dental tenderness and dental caries present.     Comments: Redness swelling and apparent abscess to gumline superior to teeth #13-15 with broken tooth #13; poor hygeine noted; left facial swelling; tenderness at left upper jaw  Cardiovascular:     Rate and Rhythm: Normal rate.  Pulmonary:     Effort: Pulmonary effort is normal.  Skin:    General: Skin is warm and dry.  Neurological:     Mental Status: She is alert and oriented to person, place, and time.      UC Treatments / Results  Labs (  all labs ordered are listed, but only abnormal results are displayed) Labs Reviewed - No data to display  EKG   Radiology No results found.  Procedures Procedures (including critical care time)  Medications Ordered in UC Medications - No data to display  Initial Impression / Assessment and Plan / UC Course   I have reviewed the triage vital signs and the nursing notes.  Pertinent labs & imaging results that were available during my care of the patient were reviewed by me and considered in my medical decision making (see chart for details).     Apparent abscess. Antibiotics initiated. Patient agreeable to following up with her oral surgeon. Patient verbalized understanding and agreeable to plan. Pain management discussed.   Final Clinical Impressions(s) / UC Diagnoses   Final diagnoses:  Dental abscess     Discharge Instructions     Warm compresses.  Ibuprofen and tylenol alternating. Don't take additional aleve.  Tramadol for breakthrough pain, may help with sleep.  Follow up with your oral surgeon for definitive treatment.  Complete course of antibiotics.     ED Prescriptions    Medication Sig Dispense Auth. Provider   clindamycin (CLEOCIN) 150 MG capsule Take 3 capsules (450 mg total) by mouth 3 (three) times daily for 7 days. 63 capsule Augusto Gamble B, NP   traMADol (ULTRAM) 50 MG tablet Take 1 tablet (50 mg total) by mouth every 12 (twelve) hours as needed for severe pain. 5 tablet Zigmund Gottron, NP     Controlled Substance Prescriptions [place] Controlled Substance Registry consulted? Yes, I have consulted the [place] Controlled Substances Registry for this patient, and feel the risk/benefit ratio today is favorable for proceeding with this prescription for a controlled substance.   Zigmund Gottron, NP 10/14/18 1546

## 2018-10-18 ENCOUNTER — Encounter: Payer: Self-pay | Admitting: Women's Health

## 2018-10-26 ENCOUNTER — Telehealth: Payer: Self-pay | Admitting: *Deleted

## 2018-10-26 MED ORDER — FLUCONAZOLE 150 MG PO TABS: 150.0000 mg | ORAL_TABLET | Freq: Once | ORAL | 0 refills | Status: AC

## 2018-10-26 NOTE — Telephone Encounter (Signed)
You are back up MD) patient called c/o irritation and vaginal itching, no discharge, no urinary symptoms she recently took antibiotic prescribed by her dentist. Office visit discussed for treatment, patient still ask that I check with you asked if diflucan tablet can be sent to pharmacy?

## 2018-10-26 NOTE — Telephone Encounter (Signed)
Diflucan 150mg x 1 dose

## 2018-10-26 NOTE — Telephone Encounter (Signed)
Patient informed, Rx sent.  

## 2018-11-24 ENCOUNTER — Other Ambulatory Visit: Payer: Self-pay | Admitting: Internal Medicine

## 2018-11-29 ENCOUNTER — Encounter: Payer: Self-pay | Admitting: Gynecology

## 2018-12-13 ENCOUNTER — Encounter: Payer: Self-pay | Admitting: Family Medicine

## 2018-12-13 ENCOUNTER — Other Ambulatory Visit: Payer: Self-pay

## 2018-12-13 ENCOUNTER — Ambulatory Visit: Payer: BC Managed Care – PPO | Admitting: Family Medicine

## 2018-12-13 VITALS — BP 129/82 | HR 70 | Temp 98.4°F | Wt 156.8 lb

## 2018-12-13 DIAGNOSIS — J309 Allergic rhinitis, unspecified: Secondary | ICD-10-CM

## 2018-12-13 DIAGNOSIS — J4521 Mild intermittent asthma with (acute) exacerbation: Secondary | ICD-10-CM | POA: Diagnosis not present

## 2018-12-13 DIAGNOSIS — E039 Hypothyroidism, unspecified: Secondary | ICD-10-CM | POA: Diagnosis not present

## 2018-12-13 MED ORDER — PROAIR RESPICLICK 108 (90 BASE) MCG/ACT IN AEPB: 1.0000 | INHALATION_SPRAY | RESPIRATORY_TRACT | 1 refills | Status: DC | PRN

## 2018-12-13 MED ORDER — ARMOUR THYROID 90 MG PO TABS: 90.0000 mg | ORAL_TABLET | Freq: Every day | ORAL | 0 refills | Status: DC

## 2018-12-13 MED ORDER — FLOVENT HFA 110 MCG/ACT IN AERO: 1.0000 | INHALATION_SPRAY | Freq: Two times a day (BID) | RESPIRATORY_TRACT | 11 refills | Status: DC

## 2018-12-13 MED ORDER — FLUTICASONE PROPIONATE 50 MCG/ACT NA SUSP: 1.0000 | Freq: Every day | NASAL | 11 refills | Status: DC

## 2018-12-13 NOTE — Patient Instructions (Addendum)
For asthma/allergies and prevention of attacks: Restart flonase 1-2 sprays each nostril DAILY.  Allegra, claritin or Zyrtec if needed for persistent allergy symptoms.  Increase flovent to 147mcg dose - 1-2 puffs twice per day (2 puffs if still requiring albuterol frequently in next few weeks).  Proair respiclick IF needed for breakthrough symptoms.  recheck in 3 months.  If any flair in asthma symptoms sooner - be seen here or other medical provider if needed. Let me know if you would like to meet with allergist.   No change in thyroid med for now, but if new symptoms or abnormal labs in future, would ask that you discuss changes with your endocrinologist.   Return to the clinic or go to the nearest emergency room if any of your symptoms worsen or new symptoms occur.   Allergic Rhinitis, Adult Allergic rhinitis is an allergic reaction that affects the mucous membrane inside the nose. It causes sneezing, a runny or stuffy nose, and the feeling of mucus going down the back of the throat (postnasal drip). Allergic rhinitis can be mild to severe. There are two types of allergic rhinitis:  Seasonal. This type is also called hay fever. It happens only during certain seasons.  Perennial. This type can happen at any time of the year. What are the causes? This condition happens when the body's defense system (immune system) responds to certain harmless substances called allergens as though they were germs.  Seasonal allergic rhinitis is triggered by pollen, which can come from grasses, trees, and weeds. Perennial allergic rhinitis may be caused by:  House dust mites.  Pet dander.  Mold spores. What are the signs or symptoms? Symptoms of this condition include:  Sneezing.  Runny or stuffy nose (nasal congestion).  Postnasal drip.  Itchy nose.  Tearing of the eyes.  Trouble sleeping.  Daytime sleepiness. How is this diagnosed? This condition may be diagnosed based on:  Your  medical history.  A physical exam.  Tests to check for related conditions, such as: ? Asthma. ? Pink eye. ? Ear infection. ? Upper respiratory infection.  Tests to find out which allergens trigger your symptoms. These may include skin or blood tests. How is this treated? There is no cure for this condition, but treatment can help control symptoms. Treatment may include:  Taking medicines that block allergy symptoms, such as antihistamines. Medicine may be given as a shot, nasal spray, or pill.  Avoiding the allergen.  Desensitization. This treatment involves getting ongoing shots until your body becomes less sensitive to the allergen. This treatment may be done if other treatments do not help.  If taking medicine and avoiding the allergen does not work, new, stronger medicines may be prescribed. Follow these instructions at home:  Find out what you are allergic to. Common allergens include smoke, dust, and pollen.  Avoid the things you are allergic to. These are some things you can do to help avoid allergens: ? Replace carpet with wood, tile, or vinyl flooring. Carpet can trap dander and dust. ? Do not smoke. Do not allow smoking in your home. ? Change your heating and air conditioning filter at least once a month. ? During allergy season:  Keep windows closed as much as possible.  Plan outdoor activities when pollen counts are lowest. This is usually during the evening hours.  When coming indoors, change clothing and shower before sitting on furniture or bedding.  Take over-the-counter and prescription medicines only as told by your health care provider.  Keep all follow-up visits as told by your health care provider. This is important. Contact a health care provider if:  You have a fever.  You develop a persistent cough.  You make whistling sounds when you breathe (you wheeze).  Your symptoms interfere with your normal daily activities. Get help right away  if:  You have shortness of breath. Summary  This condition can be managed by taking medicines as directed and avoiding allergens.  Contact your health care provider if you develop a persistent cough or fever.  During allergy season, keep windows closed as much as possible. This information is not intended to replace advice given to you by your health care provider. Make sure you discuss any questions you have with your health care provider. Document Released: 11/17/2000 Document Revised: 02/04/2017 Document Reviewed: 04/01/2016 Elsevier Patient Education  2020 [place] Prevention, Adult Although you may not be able to control the fact that you have asthma, you can take actions to prevent episodes of asthma (asthma attacks). These actions include:  Creating a written plan for managing and treating your asthma attacks (asthma action plan).  Monitoring your asthma.  Avoiding things that can irritate your airways or make your asthma symptoms worse (asthma triggers).  Taking your medicines as directed.  Acting quickly if you have signs or symptoms of an asthma attack. What are some ways to prevent an asthma attack? Create a plan Work with your health care provider to create an asthma action plan. This plan should include:  A list of your asthma triggers and how to avoid them.  A list of symptoms that you experience during an asthma attack.  Information about when to take medicine and how much medicine to take.  Information to help you understand your peak flow measurements.  Contact information for your health care providers.  Daily actions that you can take to control asthma. Monitor your asthma To monitor your asthma:  Use your peak flow meter every morning and every evening for 2-3 weeks. Record the results in a journal. A drop in your peak flow numbers on one or more days may mean that you are starting to have an asthma attack, even if you are not  having symptoms.  When you have asthma symptoms, write them down in a journal.  Avoid asthma triggers Work with your health care provider to find out what your asthma triggers are. This can be done by:  Being tested for allergies.  Keeping a journal that notes when asthma attacks occur and what may have contributed to them.  Asking your health care provider whether other medical conditions make your asthma worse. Common asthma triggers include:  Dust.  Smoke. This includes campfire smoke and secondhand smoke from tobacco products.  Pet dander.  Trees, grasses or pollens.  Very cold, dry, or humid air.  Mold.  Foods that contain high amounts of sulfites.  Strong smells.  Engine exhaust and air pollution.  Aerosol sprays and fumes from household cleaners.  Household pests and their droppings, including dust mites and cockroaches.  Certain medicines, including NSAIDs. Once you have determined your asthma triggers, take steps to avoid them. Depending on your triggers, you may be able to reduce the chance of an asthma attack by:  Keeping your home clean. Have someone dust and vacuum your home for you 1 or 2 times a week. If possible, have them use a high-efficiency particulate arrestance (HEPA) vacuum.  Washing your sheets weekly in hot water.  Using allergy-proof mattress covers and casings on your bed.  Keeping pets out of your home.  Taking care of mold and water problems in your home.  Avoiding areas where people smoke.  Avoiding using strong perfumes or odor sprays.  Avoid spending a lot of time outdoors when pollen counts are high and on very windy days.  Talking with your health care provider before stopping or starting any new medicines. Medicines Take over-the-counter and prescription medicines only as told by your health care provider. Many asthma attacks can be prevented by carefully following your medicine schedule. Taking your medicines correctly is  especially important when you cannot avoid certain asthma triggers. Even if you are doing well, do not stop taking your medicine and do not take less medicine. Act quickly If an asthma attack happens, acting quickly can decrease how severe it is and how long it lasts. Take these actions:  Pay attention to your symptoms. If you are coughing, wheezing, or having difficulty breathing, do not wait to see if your symptoms go away on their own. Follow your asthma action plan.  If you have followed your asthma action plan and your symptoms are not improving, call your health care provider or seek immediate medical care at the nearest hospital. It is important to write down how often you need to use your fast-acting rescue inhaler. You can track how often you use an inhaler in your journal. If you are using your rescue inhaler more often, it may mean that your asthma is not under control. Adjusting your asthma treatment plan may help you to prevent future asthma attacks and help you to gain better control of your condition. How can I prevent an asthma attack when I exercise? Exercise is a common asthma trigger. To prevent asthma attacks during exercise:  Follow advice from your health care provider about whether you should use your fast-acting inhaler before exercising. Many people with asthma experience exercise-induced bronchoconstriction (EIB). This condition often worsens during vigorous exercise in cold, humid, or dry environments. Usually, people with EIB can stay very active by using a fast-acting inhaler before exercising.  Avoid exercising outdoors in very cold or humid weather.  Avoid exercising outdoors when pollen counts are high.  Warm up and cool down when exercising.  Stop exercising right away if asthma symptoms start. Consider taking part in exercises that are less likely to cause asthma symptoms such as:  Indoor swimming.  Biking.  Walking.  Hiking.  Playing football. This  information is not intended to replace advice given to you by your health care provider. Make sure you discuss any questions you have with your health care provider. Document Released: 02/10/2009 Document Revised: 02/04/2017 Document Reviewed: 08/09/2015 Elsevier Patient Education  2020 [employer].    Asthma, Adult  Asthma is a long-term (chronic) condition that causes recurrent episodes in which the airways become tight and narrow. The airways are the passages that lead from the nose and mouth down into the lungs. Asthma episodes, also called asthma attacks, can cause coughing, wheezing, shortness of breath, and chest pain. The airways can also fill with mucus. During an attack, it can be difficult to breathe. Asthma attacks can range from minor to life threatening. Asthma cannot be cured, but medicines and lifestyle changes can help control it and treat acute attacks. What are the causes? This condition is believed to be caused by inherited (genetic) and environmental factors, but its exact cause is not known. There are many things that can  bring on an asthma attack or make asthma symptoms worse (triggers). Asthma triggers are different for each person. Common triggers include:  Mold.  Dust.  Cigarette smoke.  Cockroaches.  Things that can cause allergy symptoms (allergens), such as animal dander or pollen from trees or grass.  Air pollutants such as household cleaners, wood smoke, smog, or [employer] account [employer].  Cold air, weather changes, and winds (which increase molds and pollen in the air).  Strong emotional expressions such as crying or laughing hard.  Stress.  Certain medicines (such as aspirin) or types of medicines (such as beta-blockers).  Sulfites in foods and drinks. Foods and drinks that may contain sulfites include dried fruit, potato chips, and sparkling grape juice.  Infections or inflammatory conditions such as the flu, a cold, or inflammation of the nasal membranes  (rhinitis).  Gastroesophageal reflux disease (GERD).  Exercise or strenuous activity. What are the signs or symptoms? Symptoms of this condition may occur right after asthma is triggered or many hours later. Symptoms include:  Wheezing. This can sound like whistling when you breathe.  Excessive nighttime or early morning coughing.  Frequent or severe coughing with a common cold.  Chest tightness.  Shortness of breath.  Tiredness (fatigue) with minimal activity. How is this diagnosed? This condition is diagnosed based on:  Your medical history.  A physical exam.  Tests, which may include: ? Lung function studies and pulmonary studies (spirometry). These tests can evaluate the flow of air in your lungs. ? Allergy tests. ? Imaging tests, such as X-rays. How is this treated? There is no cure for this condition, but treatment can help control your symptoms. Treatment for asthma usually involves:  Identifying and avoiding your asthma triggers.  Using medicines to control your symptoms. Generally, two types of medicines are used to treat asthma: ? Controller medicines. These help prevent asthma symptoms from occurring. They are usually taken every day. ? Fast-acting reliever or rescue medicines. These quickly relieve asthma symptoms by widening the narrow and tight airways. They are used as needed and provide short-term relief.  Using supplemental oxygen. This may be needed during a severe episode.  Using other medicines, such as: ? Allergy medicines, such as antihistamines, if your asthma attacks are triggered by allergens. ? Immune medicines (immunomodulators). These are medicines that help control the immune system.  Creating an asthma action plan. An asthma action plan is a written plan for managing and treating your asthma attacks. This plan includes: ? A list of your asthma triggers and how to avoid them. ? Information about when medicines should be taken and when their  dosage should be changed. ? Instructions about using a device called a peak flow meter. A peak flow meter measures how well the lungs are working and the severity of your asthma. It helps you monitor your condition. Follow these instructions at home: Controlling your home environment Control your home environment in the following ways to help avoid triggers and prevent asthma attacks:  Change your heating and air conditioning filter regularly.  Limit your use of fireplaces and wood stoves.  Get rid of pests (such as roaches and mice) and their droppings.  Throw away plants if you see mold on them.  Clean floors and dust surfaces regularly. Use unscented cleaning products.  Try to have someone else vacuum for you regularly. Stay out of rooms while they are being vacuumed and for a short while afterward. If you vacuum, use a dust mask from a hardware store, a  double-layered or microfilter vacuum cleaner bag, or a vacuum cleaner with a HEPA filter.  Replace carpet with wood, tile, or vinyl flooring. Carpet can trap dander and dust.  Use allergy-proof pillows, mattress covers, and box spring covers.  Keep your bedroom a trigger-free room.  Avoid pets and keep windows closed when allergens are in the air.  Wash beddings every week in hot water and dry them in a dryer.  Use blankets that are made of polyester or cotton.  Clean bathrooms and kitchens with bleach. If possible, have someone repaint the walls in these rooms with mold-resistant paint. Stay out of the rooms that are being cleaned and painted.  Wash your hands often with soap and water. If soap and water are not available, use hand sanitizer.  Do not allow anyone to smoke in your home. General instructions  Take over-the-counter and prescription medicines only as told by your health care provider. ? Speak with your health care provider if you have questions about how or when to take the medicines. ? Make note if you are  requiring more frequent dosages.  Do not use any products that contain nicotine or tobacco, such as cigarettes and e-cigarettes. If you need help quitting, ask your health care provider. Also, avoid being exposed to secondhand smoke.  Use a peak flow meter as told by your health care provider. Record and keep track of the readings.  Understand and use the asthma action plan to help minimize, or stop an asthma attack, without needing to seek medical care.  Make sure you stay up to date on your yearly vaccinations as told by your health care provider. This may include vaccines for the flu and pneumonia.  Avoid outdoor activities when allergen counts are high and when air quality is low.  Wear a ski mask that covers your nose and mouth during outdoor winter activities. Exercise indoors on cold days if you can.  Warm up before exercising, and take time for a cool-down period after exercise.  Keep all follow-up visits as told by your health care provider. This is important. Where to find more information  For information about asthma, turn to the Centers for Disease Control and Prevention at http://www.clark.net/.htm  For air quality information, turn to [employer] at WeightRating.nl Contact a health care provider if:  You have wheezing, shortness of breath, or a cough even while you are taking medicine to prevent attacks.  The mucus you cough up (sputum) is thicker than usual.  Your sputum changes from clear or white to yellow, green, gray, or bloody.  Your medicines are causing side effects, such as a rash, itching, swelling, or trouble breathing.  You need to use a reliever medicine more than 2-3 times a week.  Your peak flow reading is still at 50-79% of your personal best after following your action plan for 1 hour.  You have a fever. Get help right away if:  You are getting worse and do not respond to treatment during an asthma attack.  You are short of breath when at rest  or when doing very little physical activity.  You have difficulty eating, drinking, or talking.  You have chest pain or tightness.  You develop a fast heartbeat or palpitations.  You have a bluish color to your lips or fingernails.  You are light-headed or dizzy, or you faint.  Your peak flow reading is less than 50% of your personal best.  You feel too tired to breathe normally. Summary  Asthma  is a long-term (chronic) condition that causes recurrent episodes in which the airways become tight and narrow. These episodes can cause coughing, wheezing, shortness of breath, and chest pain.  Asthma cannot be cured, but medicines and lifestyle changes can help control it and treat acute attacks.  Make sure you understand how to avoid triggers and how and when to use your medicines.  Asthma attacks can range from minor to life threatening. Get help right away if you have an asthma attack and do not respond to treatment with your usual rescue medicines. This information is not intended to replace advice given to you by your health care provider. Make sure you discuss any questions you have with your health care provider. Document Released: 02/22/2005 Document Revised: 04/27/2018 Document Reviewed: 03/29/2016 Elsevier Patient Education  [employer].   If you have lab work done today you will be contacted with your lab results within the next 2 weeks.  If you have not heard from [place] then please contact us. The fastest way to get your results is to register for My Chart.   IF you received an x-ray today, you will receive an invoice from [facility]. Please contact [facility] at [phone] with questions or concerns regarding your invoice.   IF you received labwork today, you will receive an invoice from [facility]. Please contact [facility] at [phone] with questions or concerns regarding your invoice.   Our billing staff will not be able to assist you with  questions regarding bills from these companies.  You will be contacted with the lab results as soon as they are available. The fastest way to get your results is to activate your My Chart account. Instructions are located on the last page of this paperwork. If you have not heard from [place] regarding the results in 2 weeks, please contact this office.

## 2018-12-13 NOTE — Progress Notes (Signed)
Subjective:    Patient ID: Samantha Golden, female    DOB: 31-Jan-1966, 53 y.o.   MRN: [id number]  HPI Samantha Golden is a 53 y.o. female Presents today for: Chief Complaint  Patient presents with  . Establish Care    Transfer of care from [place]. Also need refill on asthma meds. Patient also would like to know if Dr Carlota Raspberry could manage my thyroid and refills on thyroid   Hypothyroidism: Lab Results  Component Value Date   TSH 0.79 09/18/2018  hx of Hashimotos' thyroiditis.  Takes armour thyroid 50m daily. Same dose for past 6 months to a year.  Endocrinology Dr.  GCruzita Lederer usually followed every 6 months or so. Would like to have testing/rx provided here.  Prior on synthroid for years. Did not feel like synthroid in past.   Asthma:  Mild intermittent prior.  flovent 422m 2 puffs BID. Occasionally misses nighttime flovent dose.  proair respiclick as needed. Last used few nights ago.  history of bronchitis in the fall typically.  Uses more if active outdoors. 3 times per week.  Not waking up with asthma symptoms, but sometimes notes dyspnea iup at night for other reasons.   October-November is when cough starts. even when taking flovent.  ElAutomotive engineer flonase in past helped, no recent use. Chronic runny nose and congestion, sneezing in the am. Dust allergy.  Dog at home.  No air purifier.  Runny nose more in am.  Has neb machine at home if needed. No nebules.  Has not had flu vaccine - will consider, but may have done at pharmacy.   GYN: NaElon AlasNP.  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   Patient Active Problem List   Diagnosis Date Noted  . Alcohol use disorder, severe, dependence ([place]07/16/2020  . Chronic diarrhea 11/08/2017  . Lower abdominal pain 11/08/2017  . Bloating 11/08/2017  . Generalized postprandial abdominal pain 11/08/2017  . Colon cancer screening 11/08/2017  . Perimenopause 04/07/2017  . BV (bacterial vaginosis)  12/06/2016  . 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 Hypothyroidism due to Hashimoto's thyroiditis    Past Medical History:  Diagnosis Date  . Abnormal cervical Papanicolaou smear    ? LASER TREATMENT   . Allergy   . Anxiety   . BRCA negative 08/09  . Cancer ([facility]   skin ca  . Cervical dysplasia   . Hashimoto's disease    Past Surgical History:  Procedure Laterality Date  . ADENOIDECTOMY    . CERVIX LESION DESTRUCTION    . COLPOSCOPY    . INTRAUTERINE DEVICE (IUD) INSERTION  09/04/2014   Mirena   Allergies  Allergen Reactions  . Latex Shortness Of Breath, Itching and Rash    Wheezing when she removes latex gloves after working, does not use anything latex !   . Augmentin [Amoxicillin-Pot Clavulanate]   . Other     "CLEANING MATERIALS" (bleach, ammonia)   Prior to Admission medications   Medication Sig Start Date End Date Taking? Authorizing Provider  Albuterol Sulfate (PROAIR RESPICLICK) [id number] Base) MCG/ACT AEPB Inhale 1 Inhaler into the lungs every 4 (four) hours as needed. 03/06/18  Yes GrWendie AgresteMD  ARMOUR THYROID 90 MG tablet TAKE 1 TABLET ONCE DAILY. 11/24/18  Yes GhPhilemon KingdomMD  FLOVENT HFA 44 MCG/ACT inhaler USE 2 PUFFS TWICE A DAY. 04/17/18  Yes SaRutherford GuysMD  [facility] ([id number]) MCG/ACT inhaler INHALE 1-2  PUFFS INTO THE LUNGS EVERY SIX HOURS AS NEEDED FOR WHEEZING OR SHORTNESS OF BREATH. 03/23/18  Yes Rutherford Guys, MD   Social History   Socioeconomic History  . Marital status: Divorced    Spouse name: Not on file  . Number of children: 3  . Years of education: Not on file  . Highest education level: Not on file  Occupational History  . Not on file  Social Needs  . Financial resource strain: Not on file  . Food insecurity    Worry: Not on file    Inability: Not on file  . Transportation needs    Medical: Not on file    Non-medical: Not on file  Tobacco Use  . Smoking status:  Current Every Day Smoker    Packs/day: 0.25    Years: 10.00    Pack years: 2.50    Types: Cigarettes  . Smokeless tobacco: Never Used  Substance and Sexual Activity  . Alcohol use: Yes    Alcohol/week: 28.0 - 42.0 standard drinks    Types: 28 - 42 Shots of liquor per week    Frequency: Never    Comment: 5 to 6 drinks per day  . Drug use: No  . Sexual activity: Yes    Birth control/protection: I.U.D.    Comment: MIRENA 09/04/2014- first sexual encounter 20 , fewer than 5 partners  Lifestyle  . Physical activity    Days per week: Not on file    Minutes per session: Not on file  . Stress: Not on file  Relationships  . Social Herbalist on phone: Not on file    Gets together: Not on file    Attends religious service: Not on file    Active member of club or organization: Not on file    Attends meetings of clubs or organizations: Not on file    Relationship status: Not on file  . Intimate partner violence    Fear of current or ex partner: Not on file    Emotionally abused: Not on file    Physically abused: Not on file    Forced sexual activity: Not on file  Other Topics Concern  . Not on file  Social History Narrative  . Not on file    Review of Systems Per HPI.      Objective:   Physical Exam Vitals signs reviewed.  Constitutional:      Appearance: She is well-developed.  HENT:     Head: Normocephalic and atraumatic.  Eyes:     Conjunctiva/sclera: Conjunctivae normal.     Pupils: Pupils are equal, round, and reactive to light.  Neck:     Vascular: No carotid bruit.  Cardiovascular:     Rate and Rhythm: Normal rate and regular rhythm.     Heart sounds: Normal heart sounds.  Pulmonary:     Effort: Pulmonary effort is normal. No respiratory distress.     Breath sounds: Normal breath sounds. No wheezing or rales.  Abdominal:     Palpations: Abdomen is soft. There is no pulsatile mass.     Tenderness: There is no abdominal tenderness.  Skin:     General: Skin is warm and dry.  Neurological:     Mental Status: She is alert and oriented to person, place, and time.  Psychiatric:        Behavior: Behavior normal.    Vitals:   12/13/18 1112  BP: 129/82  Pulse: 70  Temp: 98.4 F (36.9  C)  TempSrc: Oral  SpO2: 97%  Weight: 156 lb 12.8 oz (71.1 kg)      Assessment & Plan:   ILYANNA BAILLARGEON is a 53 y.o. female Hypothyroidism, unspecified type - Plan: ARMOUR THYROID 90 MG tablet  - stable by prior TSH. Continue same regimen, but if new symptoms or variation in tsh, recommend discussion with endocrinologist.   Mild intermittent asthma with acute exacerbation - Plan: fluticasone (FLOVENT HFA) 110 MCG/ACT inhaler, Albuterol Sulfate (PROAIR RESPICLICK) 250 (90 Base) MCG/ACT AEPB Allergic rhinitis, unspecified seasonality, unspecified trigger - Plan: fluticasone (FLONASE) 50 MCG/ACT nasal spray  - improved control of allergies may also help with asthma prevention, but also some frequent use of albuterol.   - start flonase, otc antihistamine if needed.   - increase flovent to 193mg BID with option of 2245m BID if still frequent need of albuterol  - proair if needed. Recheck 3 months, rtc precautions.   - flu vaccine recommended.   - option to meet with allergist.  Will wear mask at work, hand washing recommended.   Meds ordered this encounter  Medications  . ARMOUR THYROID 90 MG tablet    Sig: Take 1 tablet (90 mg total) by mouth daily.    Dispense:  90 tablet    Refill:  0  . fluticasone (FLONASE) 50 MCG/ACT nasal spray    Sig: Place 1-2 sprays into both nostrils daily.    Dispense:  16 g    Refill:  11  . fluticasone (FLOVENT HFA) 110 MCG/ACT inhaler    Sig: Inhale 1-2 puffs into the lungs 2 (two) times daily.    Dispense:  1 Inhaler    Refill:  11  . Albuterol Sulfate (PROAIR RESPICLICK) [id number] Base) MCG/ACT AEPB    Sig: Inhale 1 Inhaler into the lungs every 4 (four) hours as needed.    Dispense:  1 each    Refill:  1    Patient Instructions    For asthma/allergies and prevention of attacks: Restart flonase 1-2 sprays each nostril DAILY.  Allegra, claritin or Zyrtec if needed for persistent allergy symptoms.  Increase flovent to 11074mdose - 1-2 puffs twice per day (2 puffs if still requiring albuterol frequently in next few weeks).  Proair respiclick IF needed for breakthrough symptoms.  recheck in 3 months.  If any flair in asthma symptoms sooner - be seen here or other medical provider if needed. Let me know if you would like to meet with allergist.   No change in thyroid med for now, but if new symptoms or abnormal labs in future, would ask that you discuss changes with your endocrinologist.   Return to the clinic or go to the nearest emergency room if any of your symptoms worsen or new symptoms occur.   Allergic Rhinitis, Adult Allergic rhinitis is an allergic reaction that affects the mucous membrane inside the nose. It causes sneezing, a runny or stuffy nose, and the feeling of mucus going down the back of the throat (postnasal drip). Allergic rhinitis can be mild to severe. There are two types of allergic rhinitis:  Seasonal. This type is also called hay fever. It happens only during certain seasons.  Perennial. This type can happen at any time of the year. What are the causes? This condition happens when the body's defense system (immune system) responds to certain harmless substances called allergens as though they were germs.  Seasonal allergic rhinitis is triggered by pollen, which can come from grasses, trees,  and weeds. Perennial allergic rhinitis may be caused by:  House dust mites.  Pet dander.  Mold spores. What are the signs or symptoms? Symptoms of this condition include:  Sneezing.  Runny or stuffy nose (nasal congestion).  Postnasal drip.  Itchy nose.  Tearing of the eyes.  Trouble sleeping.  Daytime sleepiness. How is this diagnosed? This condition may be  diagnosed based on:  Your medical history.  A physical exam.  Tests to check for related conditions, such as: ? Asthma. ? Pink eye. ? Ear infection. ? Upper respiratory infection.  Tests to find out which allergens trigger your symptoms. These may include skin or blood tests. How is this treated? There is no cure for this condition, but treatment can help control symptoms. Treatment may include:  Taking medicines that block allergy symptoms, such as antihistamines. Medicine may be given as a shot, nasal spray, or pill.  Avoiding the allergen.  Desensitization. This treatment involves getting ongoing shots until your body becomes less sensitive to the allergen. This treatment may be done if other treatments do not help.  If taking medicine and avoiding the allergen does not work, new, stronger medicines may be prescribed. Follow these instructions at home:  Find out what you are allergic to. Common allergens include smoke, dust, and pollen.  Avoid the things you are allergic to. These are some things you can do to help avoid allergens: ? Replace carpet with wood, tile, or vinyl flooring. Carpet can trap dander and dust. ? Do not smoke. Do not allow smoking in your home. ? Change your heating and air conditioning filter at least once a month. ? During allergy season:  Keep windows closed as much as possible.  Plan outdoor activities when pollen counts are lowest. This is usually during the evening hours.  When coming indoors, change clothing and shower before sitting on furniture or bedding.  Take over-the-counter and prescription medicines only as told by your health care provider.  Keep all follow-up visits as told by your health care provider. This is important. Contact a health care provider if:  You have a fever.  You develop a persistent cough.  You make whistling sounds when you breathe (you wheeze).  Your symptoms interfere with your normal daily activities.  Get help right away if:  You have shortness of breath. Summary  This condition can be managed by taking medicines as directed and avoiding allergens.  Contact your health care provider if you develop a persistent cough or fever.  During allergy season, keep windows closed as much as possible. This information is not intended to replace advice given to you by your health care provider. Make sure you discuss any questions you have with your health care provider. Document Released: 11/17/2000 Document Revised: 02/04/2017 Document Reviewed: 04/01/2016 Elsevier Patient Education  2020 [place] Prevention, Adult Although you may not be able to control the fact that you have asthma, you can take actions to prevent episodes of asthma (asthma attacks). These actions include:  Creating a written plan for managing and treating your asthma attacks (asthma action plan).  Monitoring your asthma.  Avoiding things that can irritate your airways or make your asthma symptoms worse (asthma triggers).  Taking your medicines as directed.  Acting quickly if you have signs or symptoms of an asthma attack. What are some ways to prevent an asthma attack? Create a plan Work with your health care provider to create an asthma action plan. This plan should  include:  A list of your asthma triggers and how to avoid them.  A list of symptoms that you experience during an asthma attack.  Information about when to take medicine and how much medicine to take.  Information to help you understand your peak flow measurements.  Contact information for your health care providers.  Daily actions that you can take to control asthma. Monitor your asthma To monitor your asthma:  Use your peak flow meter every morning and every evening for 2-3 weeks. Record the results in a journal. A drop in your peak flow numbers on one or more days may mean that you are starting to have an asthma attack, even  if you are not having symptoms.  When you have asthma symptoms, write them down in a journal.  Avoid asthma triggers Work with your health care provider to find out what your asthma triggers are. This can be done by:  Being tested for allergies.  Keeping a journal that notes when asthma attacks occur and what may have contributed to them.  Asking your health care provider whether other medical conditions make your asthma worse. Common asthma triggers include:  Dust.  Smoke. This includes campfire smoke and secondhand smoke from tobacco products.  Pet dander.  Trees, grasses or pollens.  Very cold, dry, or humid air.  Mold.  Foods that contain high amounts of sulfites.  Strong smells.  Engine exhaust and air pollution.  Aerosol sprays and fumes from household cleaners.  Household pests and their droppings, including dust mites and cockroaches.  Certain medicines, including NSAIDs. Once you have determined your asthma triggers, take steps to avoid them. Depending on your triggers, you may be able to reduce the chance of an asthma attack by:  Keeping your home clean. Have someone dust and vacuum your home for you 1 or 2 times a week. If possible, have them use a high-efficiency particulate arrestance (HEPA) vacuum.  Washing your sheets weekly in hot water.  Using allergy-proof mattress covers and casings on your bed.  Keeping pets out of your home.  Taking care of mold and water problems in your home.  Avoiding areas where people smoke.  Avoiding using strong perfumes or odor sprays.  Avoid spending a lot of time outdoors when pollen counts are high and on very windy days.  Talking with your health care provider before stopping or starting any new medicines. Medicines Take over-the-counter and prescription medicines only as told by your health care provider. Many asthma attacks can be prevented by carefully following your medicine schedule. Taking your medicines  correctly is especially important when you cannot avoid certain asthma triggers. Even if you are doing well, do not stop taking your medicine and do not take less medicine. Act quickly If an asthma attack happens, acting quickly can decrease how severe it is and how long it lasts. Take these actions:  Pay attention to your symptoms. If you are coughing, wheezing, or having difficulty breathing, do not wait to see if your symptoms go away on their own. Follow your asthma action plan.  If you have followed your asthma action plan and your symptoms are not improving, call your health care provider or seek immediate medical care at the nearest hospital. It is important to write down how often you need to use your fast-acting rescue inhaler. You can track how often you use an inhaler in your journal. If you are using your rescue inhaler more often, it may mean that your asthma  is not under control. Adjusting your asthma treatment plan may help you to prevent future asthma attacks and help you to gain better control of your condition. How can I prevent an asthma attack when I exercise? Exercise is a common asthma trigger. To prevent asthma attacks during exercise:  Follow advice from your health care provider about whether you should use your fast-acting inhaler before exercising. Many people with asthma experience exercise-induced bronchoconstriction (EIB). This condition often worsens during vigorous exercise in cold, humid, or dry environments. Usually, people with EIB can stay very active by using a fast-acting inhaler before exercising.  Avoid exercising outdoors in very cold or humid weather.  Avoid exercising outdoors when pollen counts are high.  Warm up and cool down when exercising.  Stop exercising right away if asthma symptoms start. Consider taking part in exercises that are less likely to cause asthma symptoms such as:  Indoor swimming.  Biking.  Walking.  Hiking.  Playing  football. This information is not intended to replace advice given to you by your health care provider. Make sure you discuss any questions you have with your health care provider. Document Released: 02/10/2009 Document Revised: 02/04/2017 Document Reviewed: 08/09/2015 Elsevier Patient Education  2020 [place].    Asthma, Adult  Asthma is a long-term (chronic) condition that causes recurrent episodes in which the airways become tight and narrow. The airways are the passages that lead from the nose and mouth down into the lungs. Asthma episodes, also called asthma attacks, can cause coughing, wheezing, shortness of breath, and chest pain. The airways can also fill with mucus. During an attack, it can be difficult to breathe. Asthma attacks can range from minor to life threatening. Asthma cannot be cured, but medicines and lifestyle changes can help control it and treat acute attacks. What are the causes? This condition is believed to be caused by inherited (genetic) and environmental factors, but its exact cause is not known. There are many things that can bring on an asthma attack or make asthma symptoms worse (triggers). Asthma triggers are different for each person. Common triggers include:  Mold.  Dust.  Cigarette smoke.  Cockroaches.  Things that can cause allergy symptoms (allergens), such as animal dander or pollen from trees or grass.  Air pollutants such as household cleaners, wood smoke, smog, or Advertising account planner.  Cold air, weather changes, and winds (which increase molds and pollen in the air).  Strong emotional expressions such as crying or laughing hard.  Stress.  Certain medicines (such as aspirin) or types of medicines (such as beta-blockers).  Sulfites in foods and drinks. Foods and drinks that may contain sulfites include dried fruit, potato chips, and sparkling grape juice.  Infections or inflammatory conditions such as the flu, a cold, or inflammation of the  nasal membranes (rhinitis).  Gastroesophageal reflux disease (GERD).  Exercise or strenuous activity. What are the signs or symptoms? Symptoms of this condition may occur right after asthma is triggered or many hours later. Symptoms include:  Wheezing. This can sound like whistling when you breathe.  Excessive nighttime or early morning coughing.  Frequent or severe coughing with a common cold.  Chest tightness.  Shortness of breath.  Tiredness (fatigue) with minimal activity. How is this diagnosed? This condition is diagnosed based on:  Your medical history.  A physical exam.  Tests, which may include: ? Lung function studies and pulmonary studies (spirometry). These tests can evaluate the flow of air in your lungs. ? Allergy tests. ?  Imaging tests, such as X-rays. How is this treated? There is no cure for this condition, but treatment can help control your symptoms. Treatment for asthma usually involves:  Identifying and avoiding your asthma triggers.  Using medicines to control your symptoms. Generally, two types of medicines are used to treat asthma: ? Controller medicines. These help prevent asthma symptoms from occurring. They are usually taken every day. ? Fast-acting reliever or rescue medicines. These quickly relieve asthma symptoms by widening the narrow and tight airways. They are used as needed and provide short-term relief.  Using supplemental oxygen. This may be needed during a severe episode.  Using other medicines, such as: ? Allergy medicines, such as antihistamines, if your asthma attacks are triggered by allergens. ? Immune medicines (immunomodulators). These are medicines that help control the immune system.  Creating an asthma action plan. An asthma action plan is a written plan for managing and treating your asthma attacks. This plan includes: ? A list of your asthma triggers and how to avoid them. ? Information about when medicines should be taken  and when their dosage should be changed. ? Instructions about using a device called a peak flow meter. A peak flow meter measures how well the lungs are working and the severity of your asthma. It helps you monitor your condition. Follow these instructions at home: Controlling your home environment Control your home environment in the following ways to help avoid triggers and prevent asthma attacks:  Change your heating and air conditioning filter regularly.  Limit your use of fireplaces and wood stoves.  Get rid of pests (such as roaches and mice) and their droppings.  Throw away plants if you see mold on them.  Clean floors and dust surfaces regularly. Use unscented cleaning products.  Try to have someone else vacuum for you regularly. Stay out of rooms while they are being vacuumed and for a short while afterward. If you vacuum, use a dust mask from a hardware store, a double-layered or microfilter vacuum cleaner bag, or a vacuum cleaner with a HEPA filter.  Replace carpet with wood, tile, or vinyl flooring. Carpet can trap dander and dust.  Use allergy-proof pillows, mattress covers, and box spring covers.  Keep your bedroom a trigger-free room.  Avoid pets and keep windows closed when allergens are in the air.  Wash beddings every week in hot water and dry them in a dryer.  Use blankets that are made of polyester or cotton.  Clean bathrooms and kitchens with bleach. If possible, have someone repaint the walls in these rooms with mold-resistant paint. Stay out of the rooms that are being cleaned and painted.  Wash your hands often with soap and water. If soap and water are not available, use hand sanitizer.  Do not allow anyone to smoke in your home. General instructions  Take over-the-counter and prescription medicines only as told by your health care provider. ? Speak with your health care provider if you have questions about how or when to take the medicines. ? Make note  if you are requiring more frequent dosages.  Do not use any products that contain nicotine or tobacco, such as cigarettes and e-cigarettes. If you need help quitting, ask your health care provider. Also, avoid being exposed to secondhand smoke.  Use a peak flow meter as told by your health care provider. Record and keep track of the readings.  Understand and use the asthma action plan to help minimize, or stop an asthma attack, without needing  to seek medical care.  Make sure you stay up to date on your yearly vaccinations as told by your health care provider. This may include vaccines for the flu and pneumonia.  Avoid outdoor activities when allergen counts are high and when air quality is low.  Wear a ski mask that covers your nose and mouth during outdoor winter activities. Exercise indoors on cold days if you can.  Warm up before exercising, and take time for a cool-down period after exercise.  Keep all follow-up visits as told by your health care provider. This is important. Where to find more information  For information about asthma, turn to the Centers for Disease Control and Prevention at http://www.clark.net/.htm  For air quality information, turn to AirNow at WeightRating.nl Contact a health care provider if:  You have wheezing, shortness of breath, or a cough even while you are taking medicine to prevent attacks.  The mucus you cough up (sputum) is thicker than usual.  Your sputum changes from clear or white to yellow, green, gray, or bloody.  Your medicines are causing side effects, such as a rash, itching, swelling, or trouble breathing.  You need to use a reliever medicine more than 2-3 times a week.  Your peak flow reading is still at 50-79% of your personal best after following your action plan for 1 hour.  You have a fever. Get help right away if:  You are getting worse and do not respond to treatment during an asthma attack.  You are short of breath  when at rest or when doing very little physical activity.  You have difficulty eating, drinking, or talking.  You have chest pain or tightness.  You develop a fast heartbeat or palpitations.  You have a bluish color to your lips or fingernails.  You are light-headed or dizzy, or you faint.  Your peak flow reading is less than 50% of your personal best.  You feel too tired to breathe normally. Summary  Asthma is a long-term (chronic) condition that causes recurrent episodes in which the airways become tight and narrow. These episodes can cause coughing, wheezing, shortness of breath, and chest pain.  Asthma cannot be cured, but medicines and lifestyle changes can help control it and treat acute attacks.  Make sure you understand how to avoid triggers and how and when to use your medicines.  Asthma attacks can range from minor to life threatening. Get help right away if you have an asthma attack and do not respond to treatment with your usual rescue medicines. This information is not intended to replace advice given to you by your health care provider. Make sure you discuss any questions you have with your health care provider. Document Released: 02/22/2005 Document Revised: 04/27/2018 Document Reviewed: 03/29/2016 Elsevier Patient Education  [employer].   If you have lab work done today you will be contacted with your lab results within the next 2 weeks.  If you have not heard from [place] then please contact us. The fastest way to get your results is to register for My Chart.   IF you received an x-ray today, you will receive an invoice from [facility]. Please contact [facility] at [phone] with questions or concerns regarding your invoice.   IF you received labwork today, you will receive an invoice from [facility]. Please contact [facility] at [phone] with questions or concerns regarding your invoice.   Our billing staff will not be able to assist you  with questions regarding bills from  these companies.  You will be contacted with the lab results as soon as they are available. The fastest way to get your results is to activate your My Chart account. Instructions are located on the last page of this paperwork. If you have not heard from [place] regarding the results in 2 weeks, please contact this office.       Signed,   Merri Ray, MD [facility] [place].  12/13/18 12:37 PM

## 2018-12-20 ENCOUNTER — Other Ambulatory Visit: Payer: Self-pay | Admitting: Family Medicine

## 2018-12-20 DIAGNOSIS — J4521 Mild intermittent asthma with (acute) exacerbation: Secondary | ICD-10-CM

## 2018-12-20 MED ORDER — ALBUTEROL SULFATE HFA 108 (90 BASE) MCG/ACT IN AERS: 1.0000 | INHALATION_SPRAY | RESPIRATORY_TRACT | 0 refills | Status: DC | PRN

## 2019-02-27 ENCOUNTER — Telehealth: Payer: Self-pay | Admitting: *Deleted

## 2019-02-27 DIAGNOSIS — E7800 Pure hypercholesterolemia, unspecified: Secondary | ICD-10-CM

## 2019-02-27 NOTE — Telephone Encounter (Signed)
Patient called appointment desk to schedule lab appointment to repeat AST/ALT and told Claudia she thought she was to repeat at lipid profile. Per result note 09/18/18, no repeat mentioned in call. Please advise

## 2019-02-27 NOTE — Telephone Encounter (Signed)
Left detailed message on cell, order placed for lipid profile

## 2019-02-27 NOTE — Telephone Encounter (Signed)
Yes we want to recheck liver enzymes history of an elevated AST and ALT.  Lipid panel also elevated can recheck a fasting lipid panel and liver enzymes have her come fasting.  She was trying to make some lifestyle changes.

## 2019-03-16 ENCOUNTER — Other Ambulatory Visit: Payer: Self-pay

## 2019-03-16 ENCOUNTER — Telehealth (INDEPENDENT_AMBULATORY_CARE_PROVIDER_SITE_OTHER): Payer: BC Managed Care – PPO | Admitting: Family Medicine

## 2019-03-16 VITALS — Temp 97.3°F | Wt 150.0 lb

## 2019-03-16 DIAGNOSIS — E039 Hypothyroidism, unspecified: Secondary | ICD-10-CM | POA: Diagnosis not present

## 2019-03-16 DIAGNOSIS — J309 Allergic rhinitis, unspecified: Secondary | ICD-10-CM | POA: Diagnosis not present

## 2019-03-16 DIAGNOSIS — F1090 Alcohol use, unspecified, uncomplicated: Secondary | ICD-10-CM

## 2019-03-16 DIAGNOSIS — R7989 Other specified abnormal findings of blood chemistry: Secondary | ICD-10-CM

## 2019-03-16 DIAGNOSIS — Z7289 Other problems related to lifestyle: Secondary | ICD-10-CM

## 2019-03-16 DIAGNOSIS — Z789 Other specified health status: Secondary | ICD-10-CM

## 2019-03-16 DIAGNOSIS — R109 Unspecified abdominal pain: Secondary | ICD-10-CM | POA: Diagnosis not present

## 2019-03-16 DIAGNOSIS — J4521 Mild intermittent asthma with (acute) exacerbation: Secondary | ICD-10-CM

## 2019-03-16 MED ORDER — FLUTICASONE PROPIONATE 50 MCG/ACT NA SUSP: 1.0000 | Freq: Every day | NASAL | 11 refills | Status: DC

## 2019-03-16 MED ORDER — ALBUTEROL SULFATE HFA 108 (90 BASE) MCG/ACT IN AERS: 1.0000 | INHALATION_SPRAY | RESPIRATORY_TRACT | 0 refills | Status: DC | PRN

## 2019-03-16 MED ORDER — FLOVENT HFA 110 MCG/ACT IN AERO: 1.0000 | INHALATION_SPRAY | Freq: Two times a day (BID) | RESPIRATORY_TRACT | 11 refills | Status: DC

## 2019-03-16 MED ORDER — ARMOUR THYROID 90 MG PO TABS: 90.0000 mg | ORAL_TABLET | Freq: Every day | ORAL | 0 refills | Status: DC

## 2019-03-16 NOTE — Patient Instructions (Addendum)
I will recheck liver tests with labs. I do recommend cutting back on alcohol. If you have trouble cutting back - here are some resources.    [facility]  Address: [address], [place], [place] [address]  Phone: [phone]  [facility]  Address: [address], [place], [place] [address]  Phone: [phone]  [facility] Address: [place], [place], [place] [address]  Phone: [phone]  I do recommend following up with gastroenterology about the cramping and diarrhea.  They may also be able to help with workup of liver tests.   Let her asthma is improved. Continue Flovent 1 to 2 puffs twice per day every day, albuterol only as needed. Continue Flonase and other allergy treatments as also is likely improving your asthma control.  Thyroid refilled same dose, will check labs. If elevated can follow-up with Dr. Cruzita Lederer.   Follow-up in 2 weeks to review some of the results and plan of above. Please let me know if you have questions during that time and thank you for taking my call today

## 2019-03-16 NOTE — Progress Notes (Signed)
Virtual Visit via Video Note  I connected with Samantha Golden on 03/16/19 at 10:09 AM by a video enabled telemedicine application and verified that I am speaking with the correct person using two identifiers. Video difficulty during visit - reverted to phone call part way through visit.    I discussed the limitations, risks, security and privacy concerns of performing an evaluation and management service by telephone and the availability of in person appointments. I also discussed with the patient that there may be a patient responsible charge related to this service. The patient expressed understanding and agreed to proceed, consent obtained  Chief complaint: Chief Complaint  Patient presents with  . Medical Management of Chronic Issues    3 month f/u. Patient wasnt refill on meds      History of Present Illness: Samantha Golden is a 54 y.o. female  Hypothyroidism: Lab Results  Component Value Date   TSH 0.79 09/18/2018   Taking medication daily.  Armour Thyroid, 90 mg tablet.  Previously stable, but has met with endocrinology in the past.  Discussed if new symptoms or variation in TSH, plan for endocrinology follow-up.  No new hot or cold intolerance. No new hair or skin changes, heart palpitations or new fatigue. No new weight changes. Occasional fatigue at times, overall stable. Has follow up with endocrine at end of the month.   Mild intermittent asthma Last discussed in October 2020.  Increase Flovent 110 mcg twice daily with option of 220 mcg twice daily if still frequent use of albuterol.  Started Flonase, over-the-counter pain medicine as needed for allergies. Currently using flovent 1-2 puffs BID. Most of the time using 2 puffs. Albuterol 0-2 times per week.  flonase and otc allergy med. Feels like asthma better better controlled.   Elevated LFT's: noted by OBGYN last year. Higher than in past. Had some stomach cramping, diarrhea in past, still some cramping daily. Saw GI  prior 01/2018 -  Drinks more alcohol during the summer, not during the school year. 3-5 drinks per day during the summer.  Met with behavioral health - option of medical detox, [facility]. Did not have any specific treatment, has been more self aware.  Thought other approaches were too extreme.  Current alcohol: 2 drinks per day currently, drinking daily, few more on weekends. Possible feeling of addiction?     Lab Results  Component Value Date   ALT 167 (H) 09/18/2018   AST 157 (H) 09/18/2018   ALKPHOS 54 08/31/2017   BILITOT 0.6 09/18/2018   AST 32, ALT 34 in June 2019.      Patient Active Problem List   Diagnosis Date Noted  . Alcohol use disorder, severe, dependence ([place]) 09/21/2018  . Chronic diarrhea 11/08/2017  . Lower abdominal pain 11/08/2017  . Bloating 11/08/2017  . Generalized postprandial abdominal pain 11/08/2017  . Colon cancer screening 11/08/2017  . Perimenopause 04/07/2017  . BV (bacterial vaginosis) 12/06/2016  . 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 Hypothyroidism due to Hashimoto's thyroiditis    Past Medical History:  Diagnosis Date  . Abnormal cervical Papanicolaou smear    ? LASER TREATMENT   . Allergy   . Anxiety   . BRCA negative 08/09  . Cancer ([place])    skin ca  . Cervical dysplasia   . Hashimoto's disease    Past Surgical History:  Procedure Laterality Date  . ADENOIDECTOMY    . CERVIX LESION DESTRUCTION    . COLPOSCOPY    .  INTRAUTERINE DEVICE (IUD) INSERTION  09/04/2014   Mirena   Allergies  Allergen Reactions  . Latex Shortness Of Breath, Itching and Rash    Wheezing when she removes latex gloves after working, does not use anything latex !   . Augmentin [Amoxicillin-Pot Clavulanate]   . Other     "CLEANING MATERIALS" (bleach, ammonia)   Prior to Admission medications   Medication Sig Start Date End Date Taking? Authorizing Provider  albuterol (VENTOLIN HFA) 108 (90 Base)  MCG/ACT inhaler Inhale 1-2 puffs into the lungs every 4 (four) hours as needed for wheezing or shortness of breath. 12/20/18  Yes Wendie Agreste, MD  ARMOUR THYROID 90 MG tablet Take 1 tablet (90 mg total) by mouth daily. 12/13/18  Yes Wendie Agreste, MD  fluticasone (FLONASE) 50 MCG/ACT nasal spray Place 1-2 sprays into both nostrils daily. 12/13/18  Yes Wendie Agreste, MD  fluticasone (FLOVENT HFA) 110 MCG/ACT inhaler Inhale 1-2 puffs into the lungs 2 (two) times daily. 12/13/18  Yes Wendie Agreste, MD   Social History   Socioeconomic History  . Marital status: Divorced    Spouse name: Not on file  . Number of children: 3  . Years of education: Not on file  . Highest education level: Not on file  Occupational History  . Not on file  Tobacco Use  . Smoking status: Current Every Day Smoker    Packs/day: 0.25    Years: 10.00    Pack years: 2.50    Types: Cigarettes  . Smokeless tobacco: Never Used  Substance and Sexual Activity  . Alcohol use: Yes    Alcohol/week: 28.0 - 42.0 standard drinks    Types: 28 - 42 Shots of liquor per week    Comment: 5 to 6 drinks per day  . Drug use: No  . Sexual activity: Yes    Birth control/protection: I.U.D.    Comment: MIRENA 09/04/2014- first sexual encounter 20 , fewer than 5 partners  Other Topics Concern  . Not on file  Social History Narrative  . Not on file   Social Determinants of Health   Financial Resource Strain:   . Difficulty of Paying Living Expenses: Not on file  Food Insecurity:   . Worried About Charity fundraiser in the Last Year: Not on file  . Ran Out of Food in the Last Year: Not on file  Transportation Needs:   . Lack of Transportation (Medical): Not on file  . Lack of Transportation (Non-Medical): Not on file  Physical Activity:   . Days of Exercise per Week: Not on file  . Minutes of Exercise per Session: Not on file  Stress:   . Feeling of Stress : Not on file  Social Connections:   . Frequency of  Communication with Friends and Family: Not on file  . Frequency of Social Gatherings with Friends and Family: Not on file  . Attends Religious Services: Not on file  . Active Member of Clubs or Organizations: Not on file  . Attends Archivist Meetings: Not on file  . Marital Status: Not on file  Intimate Partner Violence:   . Fear of Current or Ex-Partner: Not on file  . Emotionally Abused: Not on file  . Physically Abused: Not on file  . Sexually Abused: Not on file    Observations/Objective: No distress, appropriate responses. All questions answered.   Assessment and Plan: Alcohol use - Plan: Hepatic Function Panel Elevated LFTs - Plan: Hepatic Function  Panel, Hepatitis B surface antibody, Hepatitis B surface antigen, Hepatitis C antibody Abdominal cramping  -Suspected alcohol use disorder may be contributing to elevated LFTs. Significant increase from 2019 to last summer, heavier drinking in the summer. Resources provided if difficulty with cessation. Check LFTs, hepatitis testing, follow-up with gastroenterology for chronic abdominal cramping and diarrhea which was thought to be IBS but may also need assistance in further work-up of her elevated LFTs. Consider ultrasound if still elevated. ER/RTC precautions, recheck 2 weeks   Hypothyroidism, unspecified type - Plan: ARMOUR THYROID 90 MG tablet, TSH + free T4  -Overall stable, continue Armour Thyroid same dose, check TSH, free T4, if abnormal then endocrinology follow-up needed.  Allergic rhinitis, unspecified seasonality, unspecified trigger - Plan: fluticasone (FLONASE) 50 MCG/ACT nasal spray Mild intermittent asthma with acute exacerbation - Plan: albuterol (VENTOLIN HFA) 108 (90 Base) MCG/ACT inhaler, fluticasone (FLOVENT HFA) 110 MCG/ACT inhaler  -Asthma stable with improved allergy treatment and Flovent HFA. Continue same.  Follow Up Instructions: 2 weeks.    I discussed the assessment and treatment plan with the  patient. The patient was provided an opportunity to ask questions and all were answered. The patient agreed with the plan and demonstrated an understanding of the instructions.   The patient was advised to call back or seek an in-person evaluation if the symptoms worsen or if the condition fails to improve as anticipated.  I provided 32 minutes of non-face-to-face time during this encounter.   Wendie Agreste, MD

## 2019-03-26 ENCOUNTER — Other Ambulatory Visit: Payer: Self-pay

## 2019-03-26 ENCOUNTER — Ambulatory Visit (INDEPENDENT_AMBULATORY_CARE_PROVIDER_SITE_OTHER): Payer: BC Managed Care – PPO | Admitting: Family Medicine

## 2019-03-26 DIAGNOSIS — E039 Hypothyroidism, unspecified: Secondary | ICD-10-CM

## 2019-03-26 DIAGNOSIS — Z7289 Other problems related to lifestyle: Secondary | ICD-10-CM

## 2019-03-26 DIAGNOSIS — R7989 Other specified abnormal findings of blood chemistry: Secondary | ICD-10-CM

## 2019-03-26 DIAGNOSIS — Z789 Other specified health status: Secondary | ICD-10-CM

## 2019-03-27 LAB — HEPATITIS C ANTIBODY: Hep C Virus Ab: <0.1 s/co ratio (ref 0.0–0.9)

## 2019-03-27 LAB — HEPATITIS B SURFACE ANTIGEN: Hepatitis B Surface Ag: NEGATIVE

## 2019-03-27 LAB — TSH+FREE T4
Free T4: 0.98 ng/dL (ref 0.82–1.77)
TSH: 0.266 u[IU]/mL — ABNORMAL LOW (ref 0.450–4.500)

## 2019-03-27 LAB — HEPATIC FUNCTION PANEL
ALT: 155 IU/L — ABNORMAL HIGH (ref 0–32)
AST: 134 IU/L — ABNORMAL HIGH (ref 0–40)
Albumin: 4.7 g/dL (ref 3.8–4.9)
Alkaline Phosphatase: 81 IU/L (ref 39–117)
Bilirubin Total: 0.7 mg/dL (ref 0.0–1.2)
Bilirubin, Direct: 0.2 mg/dL (ref 0.00–0.40)
Total Protein: 7.4 g/dL (ref 6.0–8.5)

## 2019-03-27 LAB — HEPATITIS B SURFACE ANTIBODY, QUANTITATIVE: Hepatitis B Surf Ab Quant: <3.1 m[IU]/mL — ABNORMAL LOW (ref >9.9)

## 2019-03-29 ENCOUNTER — Encounter: Payer: Self-pay | Admitting: Internal Medicine

## 2019-03-29 ENCOUNTER — Ambulatory Visit (INDEPENDENT_AMBULATORY_CARE_PROVIDER_SITE_OTHER): Payer: BC Managed Care – PPO | Admitting: Internal Medicine

## 2019-03-29 DIAGNOSIS — Z5329 Procedure and treatment not carried out because of patient's decision for other reasons: Secondary | ICD-10-CM

## 2019-03-29 NOTE — Progress Notes (Signed)
NO SHOW  Patient ID: Samantha Golden, female   DOB: 1965/06/28, 54 y.o.   MRN: [id number]   HPI  Samantha Golden is a 54 y.o.-year-old female, initially referred by Dr Phineas Real, returning for f/u for uncontrolled Hashimoto's hypothyroidism. Last visit 1 year ago.  Reviewed history: Pt. has been dx with hypothyroidism in ~1990.   Previously on Synthroid DAW 225 mcg >> 300 mcg >> ... >> Currently on Armour  She was previously on Armour 90 mg alternating with 120 mg every other day.  In 09/2017 we had to decrease the dose to 90 mg daily.  Subsequent TSH was slightly abnormal, but improved.  We continued the same dose of Armour due to her symptoms and she had 2 normal sets of TFTs afterwards.  However, at last check 3 days ago, TSH was again slightly suppressed.  She takes the Armour: - in am - fasting - at least 30 min from b'fast - no Ca, Fe, PPIs - + Multivitamin later in the day - not on Biotin  Reviewed patient's TFTs: Lab Results  Component Value Date   TSH 0.266 (L) 03/26/2019   TSH 0.79 09/18/2018   TSH 0.50 04/07/2018   TSH 0.26 (L) 12/02/2017   TSH 0.05 (L) 10/03/2017   TSH 0.03 (L) 04/07/2017   TSH 0.174 (L) 09/15/2016   TSH 0.61 04/08/2016   TSH 0.05 (L) 05/12/2015   TSH 0.08 (L) 03/26/2015   FREET4 0.98 03/26/2019   FREET4 0.59 (L) 04/07/2018   FREET4 0.62 12/02/2017   FREET4 0.65 10/03/2017   FREET4 0.92 04/07/2017   FREET4 1.04 09/15/2016   FREET4 1.08 03/26/2015   FREET4 0.39 (L) 09/03/2014   FREET4 0.64 05/13/2014   FREET4 0.84 04/08/2014  TSH 4.26 in 2012. She has been out of the LT4 for ~ 1 mo before the test in 10/2013.  She had positive TPO antibodies in the past, confirming Hashimoto's thyroiditis: Component     Latest Ref Rng & Units 10/23/2013  Thyroperoxidase Ab SerPl-aCnc     <35.0 IU/mL 265.0 (H)  Thyroglobulin Ab     <40.0 IU/mL 38.2   She got palpitations when she took Adderall with the LT4 300 mcg.  Pt denies: - feeling nodules in  neck - hoarseness - dysphagia - choking - SOB with lying down ROS: Constitutional: no weight gain/no weight loss, + fatigue, + subjective hyperthermia, no subjective hypothermia Eyes: no blurry vision, no xerophthalmia ENT: no sore throat, + see HPI Cardiovascular: no CP/no SOB/no palpitations/no leg swelling Respiratory: no cough/no SOB/no wheezing Gastrointestinal: no N/no V/no D/no C/no acid reflux Musculoskeletal: no muscle aches/no joint aches Skin: no rashes, no hair loss Neurological: no tremors/no numbness/no tingling/no dizziness  I reviewed pt's medications, allergies, PMH, social hx, family hx, and changes were documented in the history of present illness. Otherwise, unchanged from my initial visit note.  Past Medical History:  Diagnosis Date  . Abnormal cervical Papanicolaou smear    ? LASER TREATMENT   . Allergy   . Anxiety   . BRCA negative 08/09  . Cancer ([place])    skin ca  . Cervical dysplasia   . Hashimoto's disease    Past Surgical History:  Procedure Laterality Date  . ADENOIDECTOMY    . CERVIX LESION DESTRUCTION    . COLPOSCOPY    . INTRAUTERINE DEVICE (IUD) INSERTION  09/04/2014   Mirena   History   Social History  . Marital Status: Divorced    Spouse Name: N/A  Number of Children: 3   Occupational History  . teacher   Social History Main Topics  . Smoking status: Former Research scientist (life sciences)  . Smokeless tobacco: Never Used  . Alcohol Use: 1.0 oz/week    2 drink(s) per week  . Drug Use: No  . Sexual Activity: Yes    Birth Control/ Protection: IUD     Comment: ParaGard 08/2012   Current Outpatient Medications on File Prior to Visit  Medication Sig Dispense Refill  . albuterol (VENTOLIN HFA) 108 (90 Base) MCG/ACT inhaler Inhale 1-2 puffs into the lungs every 4 (four) hours as needed for wheezing or shortness of breath. 18 g 0  . ARMOUR THYROID 90 MG tablet Take 1 tablet (90 mg total) by mouth daily. 90 tablet 0  . fluticasone (FLONASE) 50 MCG/ACT  nasal spray Place 1-2 sprays into both nostrils daily. 16 g 11  . fluticasone (FLOVENT HFA) 110 MCG/ACT inhaler Inhale 1-2 puffs into the lungs 2 (two) times daily. 1 Inhaler 11   Current Facility-Administered Medications on File Prior to Visit  Medication Dose Route Frequency Provider Last Rate Last Admin  . levonorgestrel (MIRENA) 20 MCG/24HR IUD   Intrauterine Once Fontaine, Belinda Block, MD       Allergies  Allergen Reactions  . Latex Shortness Of Breath, Itching and Rash    Wheezing when she removes latex gloves after working, does not use anything latex !   . Augmentin [Amoxicillin-Pot Clavulanate]   . Other     "CLEANING MATERIALS" (bleach, ammonia)   Family History  Problem Relation Age of Onset  . Breast cancer Mother        Age 50  . Breast cancer Maternal Grandmother        Age 67's  . Diabetes Maternal Grandfather   . Healthy Father   . Alcohol abuse Brother   . Cancer Paternal Aunt        Cervical  . Diabetes Paternal Uncle   . Breast cancer Paternal Aunt        Age 49's  . Colon cancer Neg Hx   . Esophageal cancer Neg Hx   . Stomach cancer Neg Hx   . Pancreatic cancer Neg Hx   . Liver disease Neg Hx   . Inflammatory bowel disease Neg Hx    PE: There were no vitals taken for this visit. There is no height or weight on file to calculate BMI.  Wt Readings from Last 3 Encounters:  03/16/19 150 lb (68 kg)  12/13/18 156 lb 12.8 oz (71.1 kg)  09/18/18 150 lb (68 kg)   Constitutional: normal weight, in NAD Eyes: PERRLA, EOMI, no exophthalmos ENT: moist mucous membranes, no thyromegaly, no cervical lymphadenopathy Cardiovascular: RRR, No MRG Respiratory: CTA B Gastrointestinal: abdomen soft, NT, ND, BS+ Musculoskeletal: no deformities, strength intact in all 4 Skin: moist, warm, no rashes Neurological: + fine tremor with outstretched hands, DTR normal in all 4  ASSESSMENT: 1. Hashimoto's Hypothyroidism - did not feel good on Synthroid  PLAN:  1. Patient  with longstanding, uncontrolled, Hashimoto's hypothyroidism, now on Armour Thyroid.  She did try levothyroxine generic and brand name and did not feel good on these.  She felt better on Armour but she continues to have nonspecific symptoms, possibly also related to menopause.  In the past she was on 90 mg of Armour alternating with 120 mg every other day.  We decreased the dose to only 90 mg of Armour in 09/2017.  Subsequent labs showed an improved TSH  but still suppressed but based on her symptoms, we decided to continue the same dose of Armour pending a repeat set of TFTs.  To subsequent sets of TFTs returned normal, but the latest TSH was still suppressed 3 days ago. -She is usually reticent to decrease the dose of Armour even after discussions about possible risks from taking too much thyroid hormones, including Palpitations, tremors, anxiety, heat intolerance, insomnia, weight loss, but in the long run: Arrhythmia, hypercoagulability with risk of strokes and heart attacks, osteoporosis -At this visit, she  - latest thyroid labs reviewed with pt >> TSH suppressed: Lab Results  Component Value Date   TSH 0.266 (L) 03/26/2019   - she continues on Armour 90 mg daily - pt feels good on this dose. - we discussed about taking the thyroid hormone every day, with water, >30 minutes before breakfast, separated by >4 hours from acid reflux medications, calcium, iron, multivitamins. Pt. is taking it correctly. - will check thyroid tests in 1 month: TSH, free T3 and fT4 - If labs are abnormal, she will need to return for repeat TFTs in 1.5 months - Otherwise, I will see her back in 1 year   Philemon Kingdom, MD PhD [facility]

## 2019-04-02 ENCOUNTER — Other Ambulatory Visit: Payer: Self-pay

## 2019-04-02 ENCOUNTER — Encounter: Payer: Self-pay | Admitting: Family Medicine

## 2019-04-02 ENCOUNTER — Ambulatory Visit: Payer: BC Managed Care – PPO | Admitting: Family Medicine

## 2019-04-02 VITALS — BP 108/70 | HR 75 | Temp 98.2°F | Ht 66.0 in | Wt 156.8 lb

## 2019-04-02 DIAGNOSIS — R7989 Other specified abnormal findings of blood chemistry: Secondary | ICD-10-CM | POA: Diagnosis not present

## 2019-04-02 DIAGNOSIS — R110 Nausea: Secondary | ICD-10-CM | POA: Diagnosis not present

## 2019-04-02 NOTE — Patient Instructions (Addendum)
Decrease alcohol intake - taper down to no alcohol as that can make liver tests worse. I will order an ultrasound and refer you to gastroenterology to evaluate this further and discuss next steps in workup. Please let me know if there are questions.  Return to the clinic or go to the nearest emergency room if any of your symptoms worsen or new symptoms occur.  Liver Function Tests Why am I having this test? Liver function tests are done to see how well your liver is working. The proteins and enzymes measured in the test can alert your health care provider to inflammation, damage, or disease in your liver. It is common to have liver function tests:  When you are taking certain medicines.  If you have liver disease.  If you drink a lot of alcohol.  When you are not feeling well.  When you have other conditions that may affect your liver.  During annual physical exams.  If you have symptoms such as yellowing of the skin (jaundice), abdominal pain, or nausea and vomiting. What is being tested? These tests measure various substances in your blood. This may include:  Alanine transaminase (ALT). This is an enzyme in the liver.  Aspartate transaminase (AST). This is an enzyme in the liver, heart, and muscles.  Alkaline phosphatase (ALP). This is a protein in the liver, bile ducts, bone, and other body tissues.  Total bilirubin. This is a yellow pigment in bile.  Albumin. This is a protein in the liver.  Prothrombin time and international normalized ratio (PT and INR). PT measures the time it takes for your blood to clot. INR is a calculation of blood clotting time based on your PT result. It is also calculated based on normal ranges defined by the lab that processed your test.  Total protein. This includes two proteins, albumin and globulin, found in the blood. What kind of sample is taken?  A blood sample is required for this test. It is usually collected by inserting a needle into  a blood vessel. How do I prepare for this test? How you prepare will depend on which tests are being done and the reason for doing them. You may need to:  Avoid eating for 4-6 hours before the test, or as told by your health care provider.  Stop taking certain medicines before your blood test, as told by your health care provider. Tell a health care provider about:  All medicines you are taking, including vitamins, herbs, eye drops, creams, and over-the-counter medicines.  Any medical conditions you have.  Whether you are pregnant or may be pregnant. How are the results reported? Your test results will be reported as values. Your health care provider will compare your results to normal ranges that were established after testing a large group of people (reference ranges). Reference ranges may vary among labs and hospitals. For the substances measured in liver function tests, common reference ranges are: ALT  Infant: 10-40 international units/L.  Child or adult: 4-36 international units/L at 37C or 4-36 units/L (SI units).  Reference ranges may be higher for older adults. AST  Newborn 11-42 days old: 35-140 units/L.  Child younger than 58 years old: 15-60 units/L.  24-76 years old: 15-50 units/L.  53-64 years old: 10-50 units/L.  84-23 years old: 10-40 units/L.  Adult: 0-35 units/L or 0-0.58 microkatals/L (SI units).  Reference ranges may be higher for older adults. ALP  Child younger than 61 years old: 85-235 units/L.  47-42 years old: 20-210  units/L.  18-46 years old: 60-300 units/L.  28-37 years old: 30-200 units/L.  Adult: 30-120 units/L or 0.5-2.0 microkatals/L (SI units).  Reference ranges may be higher for older adults. Total bilirubin  Newborn: 1.0-12.0 mg/dL or 17.1-205 micromoles/L (SI units).  Child or adult: 0.3-1.0 mg/dL or 5.1-17 micromoles/L. Albumin  Premature infant: 3.0-4.2 g/dL.  Newborn: 3.5-5.4 g/dL.  Infant: 4.4-5.4 g/dL.  Child: 4.0-5.9  g/dL.  Adult: 3.5-5.0 g/dL or 35-50 g/L (SI units). PT  11.0-12.5 seconds; 85%-100%. INR  0.8-1.1. Total protein  Premature infant: 4.2-7.6 g/dL.  Newborn: 4.6-7.4 g/dL.  Infant: 6.0-6.7 g/dL.  Child: 6.2-8.0 g/dL.  Adult: 6.4-8.3 g/dL or 64-83 g/L (SI units). What do the results mean? Results that are within the reference ranges are considered normal. For each substance measured, results outside the reference range can indicate various health issues. ALT  Levels above the normal range may indicate liver disease. AST  Levels above the normal range may indicate liver disease. Sometimes levels also increase after burns, surgery, heart attack, muscle damage, or seizure. ALP  Levels above the normal range may be seen in biliary obstruction, liver diseases, bone disease, thyroid disease, tumors, fractures, leukemia, lymphoma, or several other conditions. People with blood type O or B may show higher levels after a fatty meal.  Levels below the normal range may indicate bone and teeth conditions, malnutrition, protein deficiency, or Wilson's disease. Total bilirubin  Levels above the normal range may indicate problems with the liver, gallbladder, or bile ducts. Albumin  Levels above the normal range may indicate dehydration. They may also be caused by a diet that is high in protein.  Levels below the normal range may indicate kidney disease, liver disease, or malabsorption of nutrients. PT and INR  Levels above the normal range mean that your blood is clotting slower than normal. This may be due to blood disorders, liver disorders, or low levels of vitamin K. Total protein  Levels above the normal range may be due to infection or other diseases.  Levels below the normal range may be due to an immune system disorder, bleeding, burns, kidney disorder, liver disease, trouble absorbing or getting nutrients, or other conditions that affect the intestines. Talk with your health  care provider about what your results mean. Questions to ask your health care provider Ask your health care provider, or the department that is doing the test:  When will my results be ready?  How will I get my results?  What are my treatment options?  What other tests do I need?  What are my next steps? Summary  Liver function tests are done to see how well your liver is working.  These tests measure various proteins and enzymes in your blood. The results can alert your health care provider to inflammation, damage, or disease in your liver.  Talk with your health care provider about what your results mean. This information is not intended to replace advice given to you by your health care provider. Make sure you discuss any questions you have with your health care provider. Document Revised: 10/12/2017 Document Reviewed: 12/07/2016 Elsevier Patient Education  [employer].    If you have lab work done today you will be contacted with your lab results within the next 2 weeks.  If you have not heard from [place] then please contact us. The fastest way to get your results is to register for My Chart.   IF you received an x-ray today, you will receive an invoice from  [facility]. Please contact [facility] at [phone] with questions or concerns regarding your invoice.   IF you received labwork today, you will receive an invoice from [place]. Please contact [facility] at [phone] with questions or concerns regarding your invoice.   Our billing staff will not be able to assist you with questions regarding bills from these companies.  You will be contacted with the lab results as soon as they are available. The fastest way to get your results is to activate your My Chart account. Instructions are located on the last page of this paperwork. If you have not heard from [place] regarding the results in 2 weeks, please contact this office.

## 2019-04-03 ENCOUNTER — Encounter: Payer: Self-pay | Admitting: Family Medicine

## 2019-04-03 NOTE — Progress Notes (Signed)
Subjective:  Patient ID: Samantha Golden, female    DOB: [birth year]  Age: 54 y.o. MRN: [id number]  CC:  Chief Complaint  Patient presents with  . Elevated LFT    f/u appt. (maybe due to a fall in June 2020)    HPI Samantha Golden presents for    Elevated LFTs  Most recently discussed January 8 at telemedicine visit. Discussed increased alcohol over the summer, 2 drinks per day recently. Few more on weekends. Discussed concerns of possible alcohol use disorder, resources provided for cessation if needed. Recommend she follow-up with gastroenterology regarding chronic abdominal pain and diarrhea likely IBS.  Does note that she did have an injury after an accident and rib injury 08/16/2018 - Rib contusion. Rib XR without fracture. Wondered if that injury may be a part since levels had been closer to normal prior to that injury.  No regular tylenol use.  Current alcohol - 2 per day.  No current abdominal pain.  Some nausea at times after eating.  Recent Labs                                        History      Patient Active Problem List   Diagnosis Date Noted  . Alcohol use disorder, severe, dependence ([place]) 09/21/2018  . Chronic diarrhea 11/08/2017  . Lower abdominal pain 11/08/2017  . Bloating 11/08/2017  . Generalized postprandial abdominal pain 11/08/2017  . Colon cancer screening 11/08/2017  . Perimenopause 04/07/2017  . BV (bacterial vaginosis) 12/06/2016  . 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 Hypothyroidism due to Hashimoto's thyroiditis        Past Medical History:  Diagnosis Date  . Abnormal cervical Papanicolaou smear    ? LASER TREATMENT   . Allergy   . Anxiety   . BRCA negative 08/09  . Cancer ([place])    skin ca  . Cervical dysplasia   . Hashimoto's disease         Past Surgical History:  Procedure Laterality Date  . ADENOIDECTOMY    . CERVIX LESION DESTRUCTION    . COLPOSCOPY    . INTRAUTERINE DEVICE  (IUD) INSERTION  09/04/2014   Mirena        Allergies  Allergen Reactions  . Latex Shortness Of Breath, Itching and Rash    Wheezing when she removes latex gloves after working, does not use anything latex !   . Augmentin [Amoxicillin-Pot Clavulanate]   . Other     "CLEANING MATERIALS" (bleach, ammonia)          Prior to Admission medications   Medication Sig Start Date End Date Taking? Authorizing Provider  albuterol (VENTOLIN HFA) 108 (90 Base) MCG/ACT inhaler Inhale 1-2 puffs into the lungs every 4 (four) hours as needed for wheezing or shortness of breath. 03/16/19  Yes Wendie Agreste, MD  ARMOUR THYROID 90 MG tablet Take 1 tablet (90 mg total) by mouth daily. 03/16/19  Yes Wendie Agreste, MD  fluticasone (FLONASE) 50 MCG/ACT nasal spray Place 1-2 sprays into both nostrils daily. 03/16/19  Yes Wendie Agreste, MD  fluticasone (FLOVENT HFA) 110 MCG/ACT inhaler Inhale 1-2 puffs into the lungs 2 (two) times daily. 03/16/19  Yes Wendie Agreste, MD   Social History        Socioeconomic History  . Marital status: Divorced    Spouse  name: Not on file  . Number of children: 3  . Years of education: Not on file  . Highest education level: Not on file  Occupational History  . Not on file  Tobacco Use  . Smoking status: Former Smoker    Packs/day: 0.25    Years: 10.00    Pack years: 2.50    Types: Cigarettes    Quit date: 07/10/2011    Years since quitting: 7.7  . Smokeless tobacco: Never Used  Substance and Sexual Activity  . Alcohol use: Yes    Alcohol/week: 28.0 - 42.0 standard drinks    Types: 28 - 42 Shots of liquor per week    Comment: 5 to 6 drinks per day  . Drug use: No  . Sexual activity: Yes    Birth control/protection: I.U.D.    Comment: MIRENA 09/04/2014- first sexual encounter 20 , fewer than 5 partners  Other Topics Concern  . Not on file  Social History Narrative  . Not on file   Social Determinants of Health      Financial Resource Strain:   .  Difficulty of Paying Living Expenses: Not on file  Food Insecurity:   . Worried About Charity fundraiser in the Last Year: Not on file  . Ran Out of Food in the Last Year: Not on file  Transportation Needs:   . Lack of Transportation (Medical): Not on file  . Lack of Transportation (Non-Medical): Not on file  Physical Activity:   . Days of Exercise per Week: Not on file  . Minutes of Exercise per Session: Not on file  Stress:   . Feeling of Stress : Not on file  Social Connections:   . Frequency of Communication with Friends and Family: Not on file  . Frequency of Social Gatherings with Friends and Family: Not on file  . Attends Religious Services: Not on file  . Active Member of Clubs or Organizations: Not on file  . Attends Archivist Meetings: Not on file  . Marital Status: Not on file  Intimate Partner Violence:   . Fear of Current or Ex-Partner: Not on file  . Emotionally Abused: Not on file  . Physically Abused: Not on file  . Sexually Abused: Not on file   Review of Systems  Objective:      Vitals:   04/02/19 1420  BP: 108/70  Pulse: 75  Temp: 98.2 F (36.8 C)  TempSrc: Temporal  SpO2: 96%  Weight: 156 lb 12.8 oz (71.1 kg)  Height: '5\' 6"'  (1.676 m)   Physical Exam  Vitals reviewed.  Constitutional:  Appearance: She is well-developed.  HENT:  Head: Normocephalic and atraumatic.  Eyes:  Conjunctiva/sclera: Conjunctivae normal.  Pupils: Pupils are equal, round, and reactive to light.  Neck:  Vascular: No carotid bruit.  Cardiovascular:  Rate and Rhythm: Normal rate and regular rhythm.  Heart sounds: Normal heart sounds.  Pulmonary:  Effort: Pulmonary effort is normal.  Breath sounds: Normal breath sounds.  Abdominal:  General: There is no distension.  Palpations: Abdomen is soft. There is no pulsatile mass.  Tenderness: There is no abdominal tenderness. There is no guarding.  Skin:  General: Skin is warm and dry.  Neurological:  Mental  Status: She is alert and oriented to person, place, and time.  Psychiatric:  Behavior: Behavior normal.    Assessment & Plan:   Samantha Golden is a 54 y.o. female Elevated LFTs - Plan: US Abdomen Limited RUQ, Ambulatory referral  to Gastroenterology  Nausea without vomiting - Plan: US Abdomen Limited RUQ, Ambulatory referral to Gastroenterology  Persistent elevated LFTs.  Hepatitis testing reassuring.  Does note previous injury prior to elevated readings last year.  Hepatic injury possible at that time, but would not expect persistent elevated LFTs.  Episodic nausea, abdominal discomfort with eating.  Differential includes gallbladder cause/cholelithiasis.  No pain on abdominal exam, negative Murphy's in office.    -Did discuss potential impact of alcohol on LFTs whether or not that is the cause -taper down to no alcohol use, and avoid Tylenol for now.    -Check right upper quadrant ultrasound, refer to gastroenterology, ER precautions given if acute worsening pain/nausea/vomiting/fever.  -refer to gastroenterology  No orders of the defined types were placed in this encounter.  Patient Instructions    Decrease alcohol intake - taper down to no alcohol as that can make liver tests worse. I will order an ultrasound and refer you to gastroenterology to evaluate this further and discuss next steps in workup. Please let me know if there are questions.  Return to the clinic or go to the nearest emergency room if any of your symptoms worsen or new symptoms occur.  Liver Function Tests Why am I having this test? Liver function tests are done to see how well your liver is working. The proteins and enzymes measured in the test can alert your health care provider to inflammation, damage, or disease in your liver. It is common to have liver function tests:  When you are taking certain medicines.  If you have liver disease.  If you drink a lot of alcohol.  When you are not feeling well.  When  you have other conditions that may affect your liver.  During annual physical exams.  If you have symptoms such as yellowing of the skin (jaundice), abdominal pain, or nausea and vomiting. What is being tested? These tests measure various substances in your blood. This may include:  Alanine transaminase (ALT). This is an enzyme in the liver.  Aspartate transaminase (AST). This is an enzyme in the liver, heart, and muscles.  Alkaline phosphatase (ALP). This is a protein in the liver, bile ducts, bone, and other body tissues.  Total bilirubin. This is a yellow pigment in bile.  Albumin. This is a protein in the liver.  Prothrombin time and international normalized ratio (PT and INR). PT measures the time it takes for your blood to clot. INR is a calculation of blood clotting time based on your PT result. It is also calculated based on normal ranges defined by the lab that processed your test.  Total protein. This includes two proteins, albumin and globulin, found in the blood. What kind of sample is taken?  A blood sample is required for this test. It is usually collected by inserting a needle into a blood vessel. How do I prepare for this test? How you prepare will depend on which tests are being done and the reason for doing them. You may need to:  Avoid eating for 4-6 hours before the test, or as told by your health care provider.  Stop taking certain medicines before your blood test, as told by your health care provider. Tell a health care provider about:  All medicines you are taking, including vitamins, herbs, eye drops, creams, and over-the-counter medicines.  Any medical conditions you have.  Whether you are pregnant or may be pregnant. How are the results reported? Your test results will be reported as values.  Your health care provider will compare your results to normal ranges that were established after testing a large group of people (reference ranges). Reference  ranges may vary among labs and hospitals. For the substances measured in liver function tests, common reference ranges are: ALT  Infant: 10-40 international units/L.  Child or adult: 4-36 international units/L at 37C or 4-36 units/L (SI units).  Reference ranges may be higher for older adults. AST  Newborn 35-30 days old: 35-140 units/L.  Child younger than 52 years old: 15-60 units/L.  25-34 years old: 15-50 units/L.  45-56 years old: 10-50 units/L.  7-71 years old: 10-40 units/L.  Adult: 0-35 units/L or 0-0.58 microkatals/L (SI units).  Reference ranges may be higher for older adults. ALP  Child younger than 52 years old: 85-235 units/L.  66-72 years old: 65-210 units/L.  24-11 years old: 60-300 units/L.  19-49 years old: 30-200 units/L.  Adult: 30-120 units/L or 0.5-2.0 microkatals/L (SI units).  Reference ranges may be higher for older adults. Total bilirubin  Newborn: 1.0-12.0 mg/dL or 17.1-205 micromoles/L (SI units).  Child or adult: 0.3-1.0 mg/dL or 5.1-17 micromoles/L. Albumin  Premature infant: 3.0-4.2 g/dL.  Newborn: 3.5-5.4 g/dL.  Infant: 4.4-5.4 g/dL.  Child: 4.0-5.9 g/dL.  Adult: 3.5-5.0 g/dL or 35-50 g/L (SI units). PT  11.0-12.5 seconds; 85%-100%. INR  0.8-1.1. Total protein  Premature infant: 4.2-7.6 g/dL.  Newborn: 4.6-7.4 g/dL.  Infant: 6.0-6.7 g/dL.  Child: 6.2-8.0 g/dL.  Adult: 6.4-8.3 g/dL or 64-83 g/L (SI units). What do the results mean? Results that are within the reference ranges are considered normal. For each substance measured, results outside the reference range can indicate various health issues. ALT  Levels above the normal range may indicate liver disease. AST  Levels above the normal range may indicate liver disease. Sometimes levels also increase after burns, surgery, heart attack, muscle damage, or seizure. ALP  Levels above the normal range may be seen in biliary obstruction, liver diseases, bone disease,  thyroid disease, tumors, fractures, leukemia, lymphoma, or several other conditions. People with blood type O or B may show higher levels after a fatty meal.  Levels below the normal range may indicate bone and teeth conditions, malnutrition, protein deficiency, or Wilson's disease. Total bilirubin  Levels above the normal range may indicate problems with the liver, gallbladder, or bile ducts. Albumin  Levels above the normal range may indicate dehydration. They may also be caused by a diet that is high in protein.  Levels below the normal range may indicate kidney disease, liver disease, or malabsorption of nutrients. PT and INR  Levels above the normal range mean that your blood is clotting slower than normal. This may be due to blood disorders, liver disorders, or low levels of vitamin K. Total protein  Levels above the normal range may be due to infection or other diseases.  Levels below the normal range may be due to an immune system disorder, bleeding, burns, kidney disorder, liver disease, trouble absorbing or getting nutrients, or other conditions that affect the intestines. Talk with your health care provider about what your results mean. Questions to ask your health care provider Ask your health care provider, or the department that is doing the test:  When will my results be ready?  How will I get my results?  What are my treatment options?  What other tests do I need?  What are my next steps? Summary  Liver function tests are done to see how well your liver is working.  These tests  measure various proteins and enzymes in your blood. The results can alert your health care provider to inflammation, damage, or disease in your liver.  Talk with your health care provider about what your results mean. This information is not intended to replace advice given to you by your health care provider. Make sure you discuss any questions you have with your health care  provider. Document Revised: 10/12/2017 Document Reviewed: 12/07/2016 Elsevier Patient Education  [employer].    If you have lab work done today you will be contacted with your lab results within the next 2 weeks.  If you have not heard from [place] then please contact us. The fastest way to get your results is to register for My Chart.   IF you received an x-ray today, you will receive an invoice from [facility]. Please contact [facility] at [phone] with questions or concerns regarding your invoice.   IF you received labwork today, you will receive an invoice from [place]. Please contact [facility] at [phone] with questions or concerns regarding your invoice.   Our billing staff will not be able to assist you with questions regarding bills from these companies.  You will be contacted with the lab results as soon as they are available. The fastest way to get your results is to activate your My Chart account. Instructions are located on the last page of this paperwork. If you have not heard from [place] regarding the results in 2 weeks, please contact this office.       Signed,   Merri Ray, MD [facility].  04/03/19 12:30 PM

## 2019-04-09 ENCOUNTER — Other Ambulatory Visit: Payer: Self-pay

## 2019-04-09 ENCOUNTER — Ambulatory Visit
Admission: RE | Admit: 2019-04-09 | Discharge: 2019-04-09 | Disposition: A | Discharge status: Home / Self Care | Payer: BC Managed Care – PPO | Source: Ambulatory Visit | Attending: Family Medicine | Admitting: Family Medicine

## 2019-04-09 DIAGNOSIS — R7989 Other specified abnormal findings of blood chemistry: Secondary | ICD-10-CM

## 2019-04-09 DIAGNOSIS — R110 Nausea: Secondary | ICD-10-CM

## 2019-04-10 ENCOUNTER — Encounter: Payer: Self-pay | Admitting: Family Medicine

## 2019-04-14 ENCOUNTER — Other Ambulatory Visit: Payer: Self-pay | Admitting: Family Medicine

## 2019-04-14 DIAGNOSIS — R93429 Abnormal radiologic findings on diagnostic imaging of unspecified kidney: Secondary | ICD-10-CM

## 2019-04-14 NOTE — Progress Notes (Signed)
Cortical thinning right kidney, eval by nephrology ordered.

## 2019-04-18 ENCOUNTER — Other Ambulatory Visit: Payer: Self-pay

## 2019-04-19 ENCOUNTER — Encounter: Payer: Self-pay | Admitting: Internal Medicine

## 2019-04-19 ENCOUNTER — Ambulatory Visit: Payer: BC Managed Care – PPO | Admitting: Internal Medicine

## 2019-04-19 VITALS — BP 112/62 | HR 66 | Ht 66.0 in | Wt 154.0 lb

## 2019-04-19 DIAGNOSIS — E038 Other specified hypothyroidism: Secondary | ICD-10-CM

## 2019-04-19 DIAGNOSIS — E063 Autoimmune thyroiditis: Secondary | ICD-10-CM | POA: Diagnosis not present

## 2019-04-19 DIAGNOSIS — R7989 Other specified abnormal findings of blood chemistry: Secondary | ICD-10-CM | POA: Diagnosis not present

## 2019-04-19 DIAGNOSIS — R188 Other ascites: Secondary | ICD-10-CM

## 2019-04-19 LAB — T4, FREE: Free T4: 0.7 ng/dL (ref 0.60–1.60)

## 2019-04-19 LAB — T3, FREE: T3, Free: 4.7 pg/mL — ABNORMAL HIGH (ref 2.3–4.2)

## 2019-04-19 LAB — TSH: TSH: 0.14 u[IU]/mL — ABNORMAL LOW (ref 0.35–4.50)

## 2019-04-19 NOTE — Patient Instructions (Signed)
Please stop at the lab.  Please continue Armour 90 daily for now.  Take the thyroid hormone every day, with water, at least 30 minutes before breakfast, separated by at least 4 hours from: - acid reflux medications - calcium - iron - multivitamins  Please return in 6 months.

## 2019-04-19 NOTE — Progress Notes (Signed)
Patient ID: Samantha Golden, female   DOB: 04-15-1965, 54 y.o.   MRN: [id number]  This visit occurred during the SARS-CoV-2 public health emergency.  Safety protocols were in place, including screening questions prior to the visit, additional usage of staff PPE, and extensive cleaning of exam room while observing appropriate contact time as indicated for disinfecting solutions.   HPI  Samantha Golden is a 54 y.o.-year-old female, initially referred by Dr Phineas Real, returning for f/u for uncontrolled Hashimoto's hypothyroidism. Last visit 1 year ago.  Reviewed history: Pt. has been dx with hypothyroidism in ~1990.   Previously on Synthroid DAW 225 mcg >> 300 mcg >> ... >> Currently on Armour  At last visit she was on Armour 90 mg alternating with 120 mg every other day.  In 09/2017 we had to decrease the dose to 90 mg daily.  Subsequent TSH was still slightly abnormal but improved.  Due to her symptoms, we continued the same dose of Armour at that time.  She takes Armour: - in am - fasting - at least 30 min from b'fast - no Ca, Fe, PPIs, + vitamins  with dinner: Mg, Zn, CoQ10 - not on Biotin  Reviewed patient's TFTs: Lab Results  Component Value Date   TSH 0.266 (L) 03/26/2019   TSH 0.79 09/18/2018   TSH 0.50 04/07/2018   TSH 0.26 (L) 12/02/2017   TSH 0.05 (L) 10/03/2017   TSH 0.03 (L) 04/07/2017   TSH 0.174 (L) 09/15/2016   TSH 0.61 04/08/2016   TSH 0.05 (L) 05/12/2015   TSH 0.08 (L) 03/26/2015   FREET4 0.98 03/26/2019   FREET4 0.59 (L) 04/07/2018   FREET4 0.62 12/02/2017   FREET4 0.65 10/03/2017   FREET4 0.92 04/07/2017   FREET4 1.04 09/15/2016   FREET4 1.08 03/26/2015   FREET4 0.39 (L) 09/03/2014   FREET4 0.64 05/13/2014   FREET4 0.84 04/08/2014  TSH 4.26 in 2012. She has been out of the LT4 for ~ 1 mo before the test in 10/2013.  She has a diagnosis of Hashimoto's thyroiditis: Component     Latest Ref Rng & Units 10/23/2013  Thyroperoxidase Ab SerPl-aCnc     <35.0  IU/mL 265.0 (H)  Thyroglobulin Ab     <40.0 IU/mL 38.2   She had palpitations when she took Adderall with the LT4 300 mcg.  Pt denies: - feeling nodules in neck - hoarseness - dysphagia - choking - SOB with lying down  ROS: Constitutional: no weight gain/no weight loss, + fatigue, + subjective hyperthermia, no subjective hypothermia Eyes: no blurry vision, no xerophthalmia ENT: no sore throat, + see HPI Cardiovascular: no CP/no SOB/no palpitations/no leg swelling Respiratory: no cough/no SOB/no wheezing Gastrointestinal: no N/no V/+ D/+ C/no acid reflux, + bloating Musculoskeletal: no muscle aches /+ joint aches Skin: no rashes, no hair loss Neurological: no tremors/no numbness/no tingling/no dizziness  I reviewed pt's medications, allergies, PMH, social hx, family hx, and changes were documented in the history of present illness. Otherwise, unchanged from my initial visit note.  Past Medical History:  Diagnosis Date  . Abnormal cervical Papanicolaou smear    ? LASER TREATMENT   . Allergy   . Anxiety   . BRCA negative 08/09  . Cancer ([place])    skin ca  . Cervical dysplasia   . Hashimoto's disease    Past Surgical History:  Procedure Laterality Date  . ADENOIDECTOMY    . CERVIX LESION DESTRUCTION    . COLPOSCOPY    . INTRAUTERINE DEVICE (IUD)  INSERTION  09/04/2014   Mirena   History   Social History  . Marital Status: Divorced    Spouse Name: N/A    Number of Children: 3   Occupational History  . teacher   Social History Main Topics  . Smoking status: Former Research scientist (life sciences)  . Smokeless tobacco: Never Used  . Alcohol Use: 1.0 oz/week    2 drink(s) per week  . Drug Use: No  . Sexual Activity: Yes    Birth Control/ Protection: IUD     Comment: ParaGard 08/2012   Current Outpatient Medications on File Prior to Visit  Medication Sig Dispense Refill  . albuterol (VENTOLIN HFA) 108 (90 Base) MCG/ACT inhaler Inhale 1-2 puffs into the lungs every 4 (four) hours as  needed for wheezing or shortness of breath. 18 g 0  . ARMOUR THYROID 90 MG tablet Take 1 tablet (90 mg total) by mouth daily. 90 tablet 0  . fluticasone (FLONASE) 50 MCG/ACT nasal spray Place 1-2 sprays into both nostrils daily. 16 g 11  . fluticasone (FLOVENT HFA) 110 MCG/ACT inhaler Inhale 1-2 puffs into the lungs 2 (two) times daily. 1 Inhaler 11   Current Facility-Administered Medications on File Prior to Visit  Medication Dose Route Frequency Provider Last Rate Last Admin  . levonorgestrel (MIRENA) 20 MCG/24HR IUD   Intrauterine Once Fontaine, Belinda Block, MD       Allergies  Allergen Reactions  . Latex Shortness Of Breath, Itching and Rash    Wheezing when she removes latex gloves after working, does not use anything latex !   . Augmentin [Amoxicillin-Pot Clavulanate]   . Other     "CLEANING MATERIALS" (bleach, ammonia)   Family History  Problem Relation Age of Onset  . Breast cancer Mother        Age 72  . Breast cancer Maternal Grandmother        Age 13's  . Diabetes Maternal Grandfather   . Healthy Father   . Alcohol abuse Brother   . Cancer Paternal Aunt        Cervical  . Diabetes Paternal Uncle   . Breast cancer Paternal Aunt        Age 32's  . Colon cancer Neg Hx   . Esophageal cancer Neg Hx   . Stomach cancer Neg Hx   . Pancreatic cancer Neg Hx   . Liver disease Neg Hx   . Inflammatory bowel disease Neg Hx    PE: BP 112/62   Pulse 66   Ht '5\' 6"'  (1.676 m)   Wt 154 lb (69.9 kg)   SpO2 97%   BMI 24.86 kg/m  Body mass index is 24.86 kg/m.  Wt Readings from Last 3 Encounters:  04/19/19 154 lb (69.9 kg)  04/02/19 156 lb 12.8 oz (71.1 kg)  03/16/19 150 lb (68 kg)   Constitutional: normal weight, in NAD Eyes: PERRLA, EOMI, no exophthalmos ENT: moist mucous membranes, no thyromegaly, no cervical lymphadenopathy Cardiovascular: RRR, No MRG Respiratory: CTA B Gastrointestinal: abdomen soft, NT, ND, BS+ Musculoskeletal: no deformities, strength intact in all  4 Skin: moist, warm, no rashes Neurological: no tremor with outstretched hands, DTR normal in all 4  ASSESSMENT: 1. Hashimoto's Hypothyroidism - did not feel good on Synthroid  2.  Elevated LFTs  3.  Ascites  PLAN:  1. Patient with longstanding, uncontrolled, Hashimoto's hypothyroidism, now on Armour Thyroid.  She tried generic levothyroxine and brand-name Synthroid and did not feel good on these.  She felt better on  Armour, but she continues to have nonspecific symptoms, possibly also related to menopause.  In the past, she was on 90 mg of Armour alternating with 120 mg every other day.  We decreased the dose to only 90 mg of Armour in 09/2017.  Subsequent labs showed an improvement in her TSH but this was still suppressed.  Based on her symptoms, however, we decided to continue the same dose of Armour pending a repeat set of TFTs. 2 subsequent sets of TFTs returned normal, but the latest TSH was still suppressed at the latest check. -She is usually reticent to decrease the dose of Armour, even after discussions about possible risks from taking too much thyroid hormones, including: Palpitations: Tremors, anxiety, heat intolerance, insomnia, weight loss and in the long run: Arrhythmia, hypercoagulability with increased risk of strokes and heart attacks, osteoporosis. - latest thyroid labs reviewed with pt >> TSH was suppressed, however, due to previous normal TSH levels and her symptoms, we continued the same dose of Armour at that time Lab Results  Component Value Date   TSH 0.266 (L) 03/26/2019   - she continues on Armour 90 mg daily - pt feels still fatigued on this dose and feels she cannot lose weight - we discussed about taking the thyroid hormone every day, with water, >30 minutes before breakfast, separated by >4 hours from acid reflux medications, calcium, iron, multivitamins. Pt. is taking it correctly. - will check thyroid tests today: TSH, free T3 and fT4. If thyrotoxic, will try to  decrease the dose of Armour by small amounts - If labs are abnormal, she will need to return for repeat TFTs in 1.5 months -I will see her back in 6 months.  2.  Elevated LFTs -We reviewed together her previous LFTs and I also reviewed the results of her right upper quadrant ultrasound.  This was read as fatty liver.  She will see gastroenterology. -She is curious whether the elevated LFTs can be related to her thyroid condition.  I explained that severe Graves' disease can cause elevated AST and ALT, but this is unusual in the setting of controlled hypothyroidism, even though her latest TSH was slightly suppressed.  3.  Ascites -She also had a small amount of ascites around her liver on the recent ultrasound and I explained that overtreatment with thyroid hormones have been found to be able to cause pericarditis, however, I doubt that mild thyrotoxicosis can be related to her ascites.  Component     Latest Ref Rng & Units 04/19/2019  TSH     0.35 - 4.50 uIU/mL 0.14 (L)  T4,Free(Direct)     0.60 - 1.60 ng/dL 0.70  Triiodothyronine,Free,Serum     2.3 - 4.2 pg/mL 4.7 (H)   TSH is lower than before.  We will need to decrease the Armour dose but slowly, so that she does not have increased hypothyroid symptoms.  For now, I would suggest 75 mg daily.  We will need to repeat her TFTs in 5 to 6 weeks.  Philemon Kingdom, MD PhD [facility] End[facility]

## 2019-04-20 ENCOUNTER — Encounter: Payer: Self-pay | Admitting: Internal Medicine

## 2019-04-20 MED ORDER — ARMOUR THYROID 60 MG PO TABS: 60.0000 mg | ORAL_TABLET | Freq: Every day | ORAL | 3 refills | Status: DC

## 2019-04-20 MED ORDER — ARMOUR THYROID 15 MG PO TABS: 15.0000 mg | ORAL_TABLET | Freq: Every day | ORAL | 3 refills | Status: DC

## 2019-04-26 ENCOUNTER — Ambulatory Visit: Payer: BC Managed Care – PPO | Admitting: Gastroenterology

## 2019-05-02 ENCOUNTER — Ambulatory Visit: Payer: BC Managed Care – PPO | Admitting: Nurse Practitioner

## 2019-05-02 ENCOUNTER — Encounter: Payer: Self-pay | Admitting: Nurse Practitioner

## 2019-05-02 ENCOUNTER — Other Ambulatory Visit (INDEPENDENT_AMBULATORY_CARE_PROVIDER_SITE_OTHER): Payer: BC Managed Care – PPO

## 2019-05-02 VITALS — BP 110/80 | HR 76 | Temp 99.1°F | Ht 66.0 in | Wt 156.0 lb

## 2019-05-02 DIAGNOSIS — R7989 Other specified abnormal findings of blood chemistry: Secondary | ICD-10-CM

## 2019-05-02 DIAGNOSIS — K529 Noninfective gastroenteritis and colitis, unspecified: Secondary | ICD-10-CM

## 2019-05-02 LAB — HEPATIC FUNCTION PANEL
ALT: 108 U/L — ABNORMAL HIGH (ref 0–35)
AST: 93 U/L — ABNORMAL HIGH (ref 0–37)
Albumin: 4.7 g/dL (ref 3.5–5.2)
Alkaline Phosphatase: 64 U/L (ref 39–117)
Bilirubin, Direct: 0.1 mg/dL (ref 0.0–0.3)
Total Bilirubin: 0.5 mg/dL (ref 0.2–1.2)
Total Protein: 8 g/dL (ref 6.0–8.3)

## 2019-05-02 LAB — IBC + FERRITIN
Ferritin: 512.3 ng/mL — ABNORMAL HIGH (ref 10.0–291.0)
Iron: 78 ug/dL (ref 42–145)
Saturation Ratios: 20.5 % (ref 20.0–50.0)
Transferrin: 272 mg/dL (ref 212.0–360.0)

## 2019-05-02 NOTE — Progress Notes (Signed)
IMPRESSION and PLAN:    Samantha Golden is a 54 y.o. female with a pmh significant for, not necessarily limited to Hashimoto's     # Abnormal liver tests -ALT minimally elevated since 2017 then with acute rise in ALT to 167 and rise in AST from normal to 157 in July 2020. Just prior to those labs patient had been taking ibuprofen, Tylenol, Ultram and consuming 3-4 cocktails a day.  Only mild improvement in liver enzymes in January (  6 months after discontinuation of above meds)  -US shows fatty liver disease and small amount of perihepatic ascites -Repeat liver test today.  -Given chronicity of elevated ALT will check for other etiologies of underlying liver disease.   -Obtain Hepatitis A total.  -HBV, HCV already checked.  -Ferritin, TIBC.  -ANA, AMA, ASMA, Alpha 1 antitrypsin, Ceruloplasm -TTG, total IgA -She has already reduced Etoh to only 2 cocktails on some of the days she drinks.  Her goal right now is to have no more than 2 cocktails on any given day.  Overall she has consumed Etoh for ~ 10 years but initially it was to a lesser degree.   # Chronic diarrhea -Previously worked up by Dr. Rush Landmark.  Colonoscopy with random biopsies, EGD with small bowel biopsies, stool studies and labs including pancreatic elastase were unremarkable.  -Recommend trial of Imodium as needed  HPI:    Primary GI: Dr. Rush Landmark  Chief complaint :  Diarrhea and abnormal liver tests  **History comes from the chart and patient  Samantha Golden is a 54 year old female evaluated here last year for bloating and chronic diarrhea.  She had an unremarkable EGD with small bowel biopsies and colonoscopy with random colon biopsies.  Fecal elastase was normal as were stool studies.  Samantha Golden is here for evaluation of abnormal liver test.  Her ALT has been mildly elevated for the last few years but in July of this year her AST rose to 157 (from normal) and ALT rose to 167.  In the few weeks prior to those  labs she had been taking combination of ibuprofen, tramadol and Tylenol for injury to her back and left ribs.  Following results of these labs Samantha Golden stopped above medications.  She had repeat liver test done in January of this year with minimal improvement to liver enzymes.  Her AST declined to 134 and ALT to 155.  Ultrasound showed small amount of perihepatic ascites and fatty liver disease.  Hepatitis B surface antigen was negative.  Hepatitis B surface antibody low titer at 3.1.  Hepatitis C antibody negative.  Samantha Golden gives a 10-year history of alcohol consumption.  Over the last several years she has started drinking more.  She averages 3-4 cocktails a day.  Given problems with her liver enzymes Samantha Golden has recently cut back to drinking only 2 cocktails (most days she drinks) .  Goal is to drink no more than 2 cocktails on any given day.  Samantha Golden continues to have crampy diarrhea 1-3 times a day.  She was concerned about exocrine pancreatic insufficiency but I did explain that her pancreatic elastase was not consistent with that.  She has been hesitant to try fiber  Data Reviewed:   Oct 2019   EGD - Z-line regular, 39 cm from the incisors. - Multiple gastric polyps. Biopsied to rule out adenoma. - No gross lesions in the stomach. Biopsied for HP. - No gross lesions in the duodenal bulb, in  the first portion of the duodenum and in the second portion of the duodenum. Biopsied for Celiac and Enteropathy evaluation.  Oct 2019 Colonoscopy  Non-thrombosed external hemorrhoids found on digital rectal exam. - The examined portion of the ileum was normal. Biopsied. - Two 2 to 4 mm polyps in the rectum and in the transverse colon, removed with a cold snare. Resected and retrieved. - Normal mucosa in the entire examined colon. Biopsied. - Diverticulosis in the ascending colon. - Non-bleeding non-thrombosed external and internal hemorrhoids  Surgical [P], duodenal - BENIGN SMALL BOWEL MUCOSA. - NO ACTIVE  INFLAMMATION OR VILLOUS ATROPHY IDENTIFIED. 2. Surgical [P], gastric antrum and gastric body - CHRONIC INACTIVE GASTRITIS, MILD. - THERE IS NO EVIDENCE OF HELICOBACTER PYLORI, DYSPLASIA OR MALIGNANCY. - SEE COMMENT. 3. Surgical [P], gastric, polyp (3) - FRAGMENTS OF BENIGN POLYPOID GASTRIC-TYPE MUCOSA. - THERE IS NO EVIDENCE OF MALIGNANCY IN THE SECTIONS EXAMINED. 4. Surgical [P], ileal - BENIGN SMALL BOWEL MUCOSA. - NO VILLOUS ATROPHY, INFLAMMATION OR OTHER ABNORMALITIES PRESENT. 5. Surgical [P], random sites - BENIGN COLONIC MUCOSA. - NO SIGNIFICANT INFLAMMATION OR OTHER ABNORMALITIES IDENTIFIED. 6. Surgical [P], transverse, rectosigmoid, polyp (2) - HYPERPLASTIC POLYP(S). - THERE IS NO EVIDENCE OF MALIGNANCY   Review of systems:    Positive for back pain.  No chest pain, no SOB, no fevers, no urinary sx   Past Medical History:  Diagnosis Date  . Abnormal cervical Papanicolaou smear    ? LASER TREATMENT   . Allergy   . Anxiety   . BRCA negative 08/09  . Cancer ([place])    skin ca  . Cervical dysplasia   . Hashimoto's disease     Patient's surgical history, family medical history, social history, medications and allergies were all reviewed in Epic   Creatinine clearance cannot be calculated (Patient's most recent lab result is older than the maximum 21 days allowed.)  Current Outpatient Medications  Medication Sig Dispense Refill  . albuterol (VENTOLIN HFA) 108 (90 Base) MCG/ACT inhaler Inhale 1-2 puffs into the lungs every 4 (four) hours as needed for wheezing or shortness of breath. 18 g 0  . ARMOUR THYROID 15 MG tablet Take 1 tablet (15 mg total) by mouth daily. 45 tablet 3  . ARMOUR THYROID 60 MG tablet Take 1 tablet (60 mg total) by mouth daily before breakfast. 45 tablet 3  . fluticasone (FLONASE) 50 MCG/ACT nasal spray Place 1-2 sprays into both nostrils daily. 16 g 11  . fluticasone (FLOVENT HFA) 110 MCG/ACT inhaler Inhale 1-2 puffs into the lungs 2 (two) times  daily. 1 Inhaler 11   Current Facility-Administered Medications  Medication Dose Route Frequency Provider Last Rate Last Admin  . levonorgestrel (MIRENA) 20 MCG/24HR IUD   Intrauterine Once Fontaine, Belinda Block, MD        Physical Exam:     BP 110/80   Pulse 76   Temp 99.1 F (37.3 C)   Ht _0  (1.676 m)   Wt 156 lb (70.8 kg)   BMI 25.18 kg/m   GENERAL:  Pleasant female in NAD PSYCH: : Cooperative, normal affect CARDIAC:  RRR, no peripheral edema PULM: Normal respiratory effort, lungs CTA bilaterally, no wheezing ABDOMEN:  Nondistended, soft, nontender. No obvious masses, no hepatomegaly,  normal bowel sounds SKIN:  turgor, no lesions seen Musculoskeletal:  Normal muscle tone, normal strength NEURO: Alert and oriented x 3, no focal neurologic deficits   Tye Savoy , NP 05/02/2019, 3:43 PM

## 2019-05-02 NOTE — Patient Instructions (Signed)
If you are age 52 or older, your body mass index should be between 23-30. Your Body mass index is 25.18 kg/m. If this is out of the aforementioned range listed, please consider follow up with your Primary Care Provider.  If you are age 12 or younger, your body mass index should be between 19-25. Your Body mass index is 25.18 kg/m. If this is out of the aformentioned range listed, please consider follow up with your Primary Care Provider.   Your provider has requested that you go to the basement level for lab work before leaving today. Press "B" on the elevator. The lab is located at the first door on the left as you exit the elevator.  Imodium as needed.  We will call you with results.  Thank you for choosing me and [facility].   Tye Savoy, NP

## 2019-05-03 NOTE — Progress Notes (Signed)
Attending Physician's Attestation   I have reviewed the chart.   I agree with the Advanced Practitioner's note, impression, and recommendations with any updates as below.  Agree with additional work-up for hepatocellular etiology of liver biochemical test elevation.  Patient needs to try to completely abstain from alcohol completely although the liver testing does not suggest an alcoholic hepatitis or typical alcohol use pattern.  Further recommendations based on findings of the laboratories and potential need for liver biopsy if these liver test abnormalities remain as liver tests have remained abnormal for greater than 6 months.  We will see what happens in the course of the next few months.  Justice Britain, MD [facility] # [phone]

## 2019-05-09 LAB — MITOCHONDRIAL ANTIBODIES: Mitochondrial M2 Ab, IgG: <=20 U

## 2019-05-09 LAB — CERULOPLASMIN: Ceruloplasmin: 32 mg/dL (ref 18–53)

## 2019-05-09 LAB — ALPHA-1-ANTITRYPSIN: A-1 Antitrypsin, Ser: 177 mg/dL (ref 83–199)

## 2019-05-09 LAB — ANTI-SMOOTH MUSCLE ANTIBODY, IGG: Actin (Smooth Muscle) Antibody (IGG): <20 U (ref <20)

## 2019-05-09 LAB — HEPATITIS A ANTIBODY, TOTAL: Hepatitis A AB,Total: NONREACTIVE

## 2019-05-09 LAB — ANA: Anti Nuclear Antibody (ANA): NEGATIVE

## 2019-05-17 ENCOUNTER — Telehealth: Payer: Self-pay

## 2019-05-17 ENCOUNTER — Other Ambulatory Visit: Payer: Self-pay

## 2019-05-17 DIAGNOSIS — A048 Other specified bacterial intestinal infections: Secondary | ICD-10-CM

## 2019-05-17 NOTE — Telephone Encounter (Signed)
-----   Message from Willia Craze, NP sent at 05/15/2019  3:26 PM EST ----- Marykay Lex, I posted to mychart her lastest lab results / comments. I called her a week or so ago about the ferritin she had questions about. Will you get her a follow up with [facility] in ~ 4 weeks?  Thanks

## 2019-05-17 NOTE — Telephone Encounter (Signed)
I called her. No answer. Left a message with appointment information. Thursday 07/05/19 at 3:30 pm

## 2019-05-21 ENCOUNTER — Encounter: Payer: Self-pay | Admitting: Internal Medicine

## 2019-05-21 DIAGNOSIS — E063 Autoimmune thyroiditis: Secondary | ICD-10-CM

## 2019-05-21 DIAGNOSIS — E038 Other specified hypothyroidism: Secondary | ICD-10-CM

## 2019-06-01 ENCOUNTER — Other Ambulatory Visit: Payer: BC Managed Care – PPO

## 2019-06-01 ENCOUNTER — Encounter: Payer: Self-pay | Admitting: Internal Medicine

## 2019-06-01 DIAGNOSIS — E063 Autoimmune thyroiditis: Secondary | ICD-10-CM

## 2019-06-01 DIAGNOSIS — E038 Other specified hypothyroidism: Secondary | ICD-10-CM

## 2019-06-01 LAB — T4, FREE: Free T4: 0.51 ng/dL — ABNORMAL LOW (ref 0.60–1.60)

## 2019-06-01 LAB — TSH: TSH: 2.07 u[IU]/mL (ref 0.35–4.50)

## 2019-07-05 ENCOUNTER — Ambulatory Visit: Payer: BC Managed Care – PPO | Admitting: Gastroenterology

## 2019-07-19 ENCOUNTER — Other Ambulatory Visit: Payer: Self-pay | Admitting: Nephrology

## 2019-07-19 DIAGNOSIS — R93429 Abnormal radiologic findings on diagnostic imaging of unspecified kidney: Secondary | ICD-10-CM

## 2019-07-30 ENCOUNTER — Other Ambulatory Visit: Payer: BC Managed Care – PPO

## 2019-08-10 ENCOUNTER — Ambulatory Visit: Payer: BC Managed Care – PPO | Admitting: Gastroenterology

## 2019-08-13 ENCOUNTER — Ambulatory Visit
Admission: RE | Admit: 2019-08-13 | Discharge: 2019-08-13 | Disposition: A | Discharge status: Home / Self Care | Payer: BC Managed Care – PPO | Source: Ambulatory Visit | Attending: Nephrology | Admitting: Nephrology

## 2019-08-13 DIAGNOSIS — R93429 Abnormal radiologic findings on diagnostic imaging of unspecified kidney: Secondary | ICD-10-CM

## 2019-09-06 HISTORY — PX: OTHER SURGICAL HISTORY: SHX169

## 2019-09-20 ENCOUNTER — Encounter: Payer: Self-pay | Admitting: Nurse Practitioner

## 2019-09-20 ENCOUNTER — Ambulatory Visit: Payer: BC Managed Care – PPO | Admitting: Nurse Practitioner

## 2019-09-20 ENCOUNTER — Telehealth: Payer: Self-pay | Admitting: *Deleted

## 2019-09-20 ENCOUNTER — Other Ambulatory Visit: Payer: Self-pay

## 2019-09-20 VITALS — BP 120/82 | Ht 66.0 in | Wt 151.4 lb

## 2019-09-20 DIAGNOSIS — Z30432 Encounter for removal of intrauterine contraceptive device: Secondary | ICD-10-CM | POA: Diagnosis not present

## 2019-09-20 DIAGNOSIS — N644 Mastodynia: Secondary | ICD-10-CM

## 2019-09-20 DIAGNOSIS — Z01419 Encounter for gynecological examination (general) (routine) without abnormal findings: Secondary | ICD-10-CM

## 2019-09-20 DIAGNOSIS — Z13220 Encounter for screening for lipoid disorders: Secondary | ICD-10-CM | POA: Diagnosis not present

## 2019-09-20 NOTE — Telephone Encounter (Signed)
-----   Message from Tamela Gammon, NP sent at 09/20/2019  9:33 AM EDT ----- Please send referral for bilateral diagnostic mammogram for breast pain and first degree relative with breast cancer. She is also due for her annual screening in August.

## 2019-09-20 NOTE — Telephone Encounter (Deleted)
-----   Message from Tamela Gammon, NP sent at 09/20/2019  9:33 AM EDT ----- Please send referral for bilateral diagnostic mammogram for breast pain and first degree relative with breast cancer. She is also due for her annual screening in August.

## 2019-09-20 NOTE — Telephone Encounter (Signed)
Patient scheduled at [facility] 10/17/19 @ 10:00am , order faxed. Sent my chart message.

## 2019-09-20 NOTE — Progress Notes (Signed)
   Samantha Golden February 04, 1966 [id number]   History:  54 y.o. G3 P3 presents for annual exam. Mirena IUD placed 08/2014, due to be removed. Elevated [place] in 2019, having hot flashes. Normal mammogram history. Complains of bilateral breast tenderness, thought she felt lump on left breast. Mother, maternal grandmother breast cancer, patient BRCA negative.  Hypothyroidism managed by endocrinology.   Gynecologic History No LMP recorded. (Menstrual status: IUD).   Contraception: IUD Last Pap: 09/18/2018. Results were: +ASCUS, negative HPV Last mammogram: 10/18/2018. Results were: normal Last colonoscopy: 12/13/2017. Results were: polyps  Past medical history, past surgical history, family history and social history were all reviewed and documented in the EPIC chart.  ROS:  A ROS was performed and pertinent positives and negatives are included.  Exam:  Vitals:   09/20/19 0849  BP: 120/82  Weight: 151 lb 6.4 oz (68.7 kg)  Height: _0  (1.676 m)   Body mass index is 24.44 kg/m.  General appearance:  Normal Thyroid:  Symmetrical, normal in size, without palpable masses or nodularity. Respiratory  Auscultation:  Clear without wheezing or rhonchi Cardiovascular  Auscultation:  Regular rate, without rubs, murmurs or gallops  Edema/varicosities:  Not grossly evident Abdominal  Soft,nontender, without masses, guarding or rebound.  Liver/spleen:  No organomegaly noted  Hernia:  None appreciated  Skin  Inspection:  Grossly normal   Breasts: Examined lying and sitting.   Right: Without masses, retractions, discharge or axillary adenopathy.   Left: Without masses, retractions, discharge or axillary adenopathy. Gentitourinary   Inguinal/mons:  Normal without inguinal adenopathy  External genitalia:  Normal  BUS/Urethra/Skene's glands:  Normal  Vagina:  Normal  Cervix:  Normal, IUD string visible in os  Uterus:  Anteverted, normal in size, shape and contour.  Midline and  mobile  Adnexa/parametria:     Rt: Without masses or tenderness.   Lt: Without masses or tenderness.  Anus and perineum: Normal  Digital rectal exam: Normal sphincter tone without palpated masses or tenderness  Assessment/Plan:  54 y.o. G3 P3 for annual exam.    Well woman exam with routine gynecological exam - Plan: Comprehensive metabolic panel, CBC with Differential/Platelet, PAP,TP IMGw/HPV RNA,rflx HPVTYPE16,18/45. Education provided on SBEs, importance of preventative screenings, current guidelines, high calcium diet, regular exercise, and multivitamin daily.   Encounter for IUD removal - string visible in os, removed, shown to patient, and discarded.   Lipid screening - Plan: Lipid panel  Pain of both breasts - tenderness with palpation of left lower breast. No masses or lymphadenopathy felt, no retractions or nipple discharge. Will send referral for bilateral diagnostic mammogram due to family history and change.   Follow up in 1 year for annual.        Tamela Gammon [facility], 9:03 AM 09/20/2019

## 2019-09-20 NOTE — Patient Instructions (Signed)

## 2019-09-21 LAB — COMPREHENSIVE METABOLIC PANEL
AG Ratio: 1.7 (calc) (ref 1.0–2.5)
ALT: 105 U/L — ABNORMAL HIGH (ref 6–29)
AST: 92 U/L — ABNORMAL HIGH (ref 10–35)
Albumin: 4.7 g/dL (ref 3.6–5.1)
Alkaline phosphatase (APISO): 72 U/L (ref 37–153)
BUN: 11 mg/dL (ref 7–25)
CO2: 25 mmol/L (ref 20–32)
Calcium: 10.5 mg/dL — ABNORMAL HIGH (ref 8.6–10.4)
Chloride: 102 mmol/L (ref 98–110)
Creat: 0.6 mg/dL (ref 0.50–1.05)
Globulin: 2.8 g/dL (calc) (ref 1.9–3.7)
Glucose, Bld: 86 mg/dL (ref 65–99)
Potassium: 3.8 mmol/L (ref 3.5–5.3)
Sodium: 141 mmol/L (ref 135–146)
Total Bilirubin: 0.7 mg/dL (ref 0.2–1.2)
Total Protein: 7.5 g/dL (ref 6.1–8.1)

## 2019-09-21 LAB — CBC WITH DIFFERENTIAL/PLATELET
Absolute Monocytes: 613 cells/uL (ref 200–950)
Basophils Absolute: 29 cells/uL (ref 0–200)
Basophils Relative: 0.6 %
Eosinophils Absolute: 181 cells/uL (ref 15–500)
Eosinophils Relative: 3.7 %
HCT: 45 % (ref 35.0–45.0)
Hemoglobin: 15.8 g/dL — ABNORMAL HIGH (ref 11.7–15.5)
Lymphs Abs: 1201 cells/uL (ref 850–3900)
MCH: 34.7 pg — ABNORMAL HIGH (ref 27.0–33.0)
MCHC: 35.1 g/dL (ref 32.0–36.0)
MCV: 98.9 fL (ref 80.0–100.0)
MPV: 9.8 fL (ref 7.5–12.5)
Monocytes Relative: 12.5 %
Neutro Abs: 2876 cells/uL (ref 1500–7800)
Neutrophils Relative %: 58.7 %
Platelets: 203 10*3/uL (ref 140–400)
RBC: 4.55 10*6/uL (ref 3.80–5.10)
RDW: 12 % (ref 11.0–15.0)
Total Lymphocyte: 24.5 %
WBC: 4.9 10*3/uL (ref 3.8–10.8)

## 2019-09-21 LAB — LIPID PANEL
Cholesterol: 251 mg/dL — ABNORMAL HIGH (ref <200)
HDL: 112 mg/dL (ref >=50)
LDL Cholesterol (Calc): 123 mg/dL (calc) — ABNORMAL HIGH
Non-HDL Cholesterol (Calc): 139 mg/dL (calc) — ABNORMAL HIGH (ref <130)
Total CHOL/HDL Ratio: 2.2 (calc) (ref <5.0)
Triglycerides: 70 mg/dL (ref <150)

## 2019-09-24 LAB — PAP, TP IMAGING W/ HPV RNA, RFLX HPV TYPE 16,18/45: HPV DNA High Risk: NOT DETECTED

## 2019-10-18 ENCOUNTER — Encounter: Payer: Self-pay | Admitting: Nurse Practitioner

## 2019-10-22 ENCOUNTER — Encounter: Payer: Self-pay | Admitting: Internal Medicine

## 2019-10-22 ENCOUNTER — Ambulatory Visit (INDEPENDENT_AMBULATORY_CARE_PROVIDER_SITE_OTHER): Payer: BC Managed Care – PPO | Admitting: Internal Medicine

## 2019-10-22 DIAGNOSIS — Z5329 Procedure and treatment not carried out because of patient's decision for other reasons: Secondary | ICD-10-CM

## 2019-10-22 NOTE — Progress Notes (Signed)
NO SHOW

## 2019-10-24 ENCOUNTER — Other Ambulatory Visit: Payer: Self-pay | Admitting: Internal Medicine

## 2019-10-24 ENCOUNTER — Telehealth: Payer: Self-pay | Admitting: Internal Medicine

## 2019-10-25 NOTE — Telephone Encounter (Signed)
Patient called to check in on the refill for the Armour 15.

## 2019-10-25 NOTE — Telephone Encounter (Signed)
She needs an appointment.

## 2019-10-26 ENCOUNTER — Other Ambulatory Visit: Payer: Self-pay

## 2019-10-26 MED ORDER — ARMOUR THYROID 15 MG PO TABS
15.0000 mg | ORAL_TABLET | Freq: Every day | ORAL | 0 refills | Status: DC
Start: 2019-10-26 — End: 2019-12-13

## 2019-10-26 MED ORDER — ARMOUR THYROID 60 MG PO TABS
60.0000 mg | ORAL_TABLET | Freq: Every day | ORAL | 0 refills | Status: DC
Start: 2019-10-26 — End: 2019-12-13

## 2019-10-26 NOTE — Telephone Encounter (Signed)
Patient scheduled for 12/27/19

## 2019-12-12 ENCOUNTER — Other Ambulatory Visit: Payer: Self-pay | Admitting: Internal Medicine

## 2019-12-19 ENCOUNTER — Other Ambulatory Visit: Payer: Self-pay

## 2019-12-19 ENCOUNTER — Ambulatory Visit (INDEPENDENT_AMBULATORY_CARE_PROVIDER_SITE_OTHER): Payer: BC Managed Care – PPO | Admitting: Registered Nurse

## 2019-12-19 ENCOUNTER — Encounter: Payer: Self-pay | Admitting: Registered Nurse

## 2019-12-19 ENCOUNTER — Ambulatory Visit: Payer: BC Managed Care – PPO | Admitting: Family Medicine

## 2019-12-19 DIAGNOSIS — R059 Cough, unspecified: Secondary | ICD-10-CM | POA: Diagnosis not present

## 2019-12-19 DIAGNOSIS — J4521 Mild intermittent asthma with (acute) exacerbation: Secondary | ICD-10-CM

## 2019-12-19 DIAGNOSIS — J309 Allergic rhinitis, unspecified: Secondary | ICD-10-CM

## 2019-12-19 MED ORDER — FLUTICASONE PROPIONATE 50 MCG/ACT NA SUSP: 1.0000 | Freq: Every day | NASAL | 11 refills | Status: DC

## 2019-12-19 MED ORDER — FLOVENT HFA 110 MCG/ACT IN AERO: 1.0000 | INHALATION_SPRAY | Freq: Two times a day (BID) | RESPIRATORY_TRACT | 10 refills | Status: DC

## 2019-12-19 MED ORDER — ALBUTEROL SULFATE HFA 108 (90 BASE) MCG/ACT IN AERS: 1.0000 | INHALATION_SPRAY | RESPIRATORY_TRACT | 0 refills | Status: DC | PRN

## 2019-12-19 NOTE — Patient Instructions (Signed)
° ° ° °  If you have lab work done today you will be contacted with your lab results within the next 2 weeks.  If you have not heard from us then please contact us. The fastest way to get your results is to register for My Chart. ° ° °IF you received an x-ray today, you will receive an invoice from [facility]. Please contact [facility] at [phone] with questions or concerns regarding your invoice.  ° °IF you received labwork today, you will receive an invoice from [facility]. Please contact [facility] at [phone] with questions or concerns regarding your invoice.  ° °Our billing staff will not be able to assist you with questions regarding bills from these companies. ° °You will be contacted with the lab results as soon as they are available. The fastest way to get your results is to activate your My Chart account. Instructions are located on the last page of this paperwork. If you have not heard from us regarding the results in 2 weeks, please contact this office. °  ° ° ° °

## 2019-12-19 NOTE — Progress Notes (Signed)
Acute Office Visit  Subjective:    Patient ID: Samantha Golden, female    DOB: 03/23/1965, 54 y.o.   MRN: [id number]  Chief Complaint  Patient presents with   Medication Refill    Patient states she needs medications on Inhalers. PAtient would like to discuss Respiratory issue.    HPI Patient is in today for refill on inhalers  Noted that breathing has gotten a lot worse over the past few days - weeks. Notes that this happens every year with allergies and the start of the school year. She is having some yellow/brown sputum. In the past, has had 1-2 episodes of blood streaking in sputum, but has not happened recently. Is having some increased shortness of breath, easily getting out of breath, and feeling that she can't get a satisfying breath in. This is giving her a lot of anxiety. 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 Also has a history of light smoking. Has since quit, but often was down to just a few cigarettes a day - less than half a pack.  In the past has been on daily flovent, daily flonase nasal spray, and albuterol inhaler PRN.   Past Medical History:  Diagnosis Date   Abnormal cervical Papanicolaou smear    ? LASER TREATMENT    Allergy    Anxiety    BRCA negative 08/09   Cancer ([place])    skin ca   Cervical dysplasia    Hashimoto's disease     Past Surgical History:  Procedure Laterality Date   ADENOIDECTOMY     CERVIX LESION DESTRUCTION     COLPOSCOPY     INTRAUTERINE DEVICE (IUD) INSERTION  09/04/2014   Mirena   SKIN CANCER REMOVED  09/2019    Family History  Problem Relation Age of Onset   Breast cancer Mother        Age 38   Breast cancer Maternal Grandmother        Age 68's   Diabetes Maternal Grandfather    Healthy Father    Alcohol abuse Brother    Cancer Paternal Aunt        Cervical    Diabetes Paternal Uncle    Breast cancer Paternal Aunt        Age 3's   Colon cancer Neg Hx    Esophageal cancer Neg Hx    Stomach cancer Neg Hx    Pancreatic cancer Neg Hx    Liver disease Neg Hx    Inflammatory bowel disease Neg Hx     Social History   Socioeconomic History   Marital status: Divorced    Spouse name: Not on file   Number of children: 3   Years of education: Not on file   Highest education level: Not on file  Occupational History   Not on file  Tobacco Use   Smoking status: Former Smoker    Packs/day: 0.25    Years: 10.00    Pack years: 2.50    Types: Cigarettes    Quit date: 07/10/2011    Years since quitting: 8.4   Smokeless tobacco: Never Used  Vaping Use   Vaping Use: Never used  Substance and Sexual Activity   Alcohol use: Yes    Alcohol/week: 28.0 - 42.0 standard drinks    Types: 28 - 42 Shots of  liquor per week    Comment: 5 to 6 drinks per day   Drug use: No   Sexual activity: Yes    Birth control/protection: I.U.D.    Comment: MIRENA 09/04/2014- first sexual encounter 20 , fewer than 5 partners  Other Topics Concern   Not on file  Social History Narrative   Not on file   Social Determinants of Health   Financial Resource Strain:    Difficulty of Paying Living Expenses: Not on file  Food Insecurity:    Worried About [place] in the Last Year: Not on file   Ran Out of Food in the Last Year: Not on file  Transportation Needs:    Lack of Transportation (Medical): Not on file   Lack of Transportation (Non-Medical): Not on file  Physical Activity:    Days of Exercise per Week: Not on file   Minutes of Exercise per Session: Not on file  Stress:    Feeling of Stress : Not on file  Social Connections:    Frequency of Communication with Friends and Family: Not on file   Frequency of Social Gatherings with Friends and Family: Not on file   Attends Religious Services: Not on file   Active Member  of Clubs or Organizations: Not on file   Attends Archivist Meetings: Not on file   Marital Status: Not on file  Intimate Partner Violence:    Fear of Current or Ex-Partner: Not on file   Emotionally Abused: Not on file   Physically Abused: Not on file   Sexually Abused: Not on file    Outpatient Medications Prior to Visit  Medication Sig Dispense Refill   ARMOUR THYROID 15 MG tablet TAKE 1 TABLET ONCE DAILY. 45 tablet 0   ARMOUR THYROID 60 MG tablet TAKE 1 TABLET ONCE DAILY BEFORE BREAKFAST. 45 tablet 0   albuterol (VENTOLIN HFA) 108 (90 Base) MCG/ACT inhaler Inhale 1-2 puffs into the lungs every 4 (four) hours as needed for wheezing or shortness of breath. 18 g 0   fluticasone (FLONASE) 50 MCG/ACT nasal spray Place 1-2 sprays into both nostrils daily. 16 g 11   fluticasone (FLOVENT HFA) 110 MCG/ACT inhaler Inhale 1-2 puffs into the lungs 2 (two) times daily. 1 Inhaler 11   Facility-Administered Medications Prior to Visit  Medication Dose Route Frequency Provider Last Rate Last Admin   levonorgestrel (MIRENA) 20 MCG/24HR IUD   Intrauterine Once Fontaine, Belinda Block, MD        Allergies  Allergen Reactions   Latex Shortness Of Breath, Itching and Rash    Wheezing when she removes latex gloves after working, does not use anything latex !    Augmentin [Amoxicillin-Pot Clavulanate]    Other     "CLEANING MATERIALS" (bleach, ammonia)    Review of Systems  Constitutional: Negative.   HENT: Negative.   Eyes: Negative.   Respiratory: Positive for cough, shortness of breath and wheezing.   Cardiovascular: Negative.   Gastrointestinal: Negative.   Genitourinary: Negative.   Musculoskeletal: Negative.   Skin: Negative.   Neurological: Negative.   Psychiatric/Behavioral: Negative.        Objective:    Physical Exam Vitals and nursing note reviewed.  Constitutional:      Appearance: Normal appearance. She is normal weight.  Cardiovascular:     Rate  and Rhythm: Normal rate and regular rhythm.  Pulmonary:     Effort: Pulmonary effort is normal. No respiratory distress.  Neurological:  General: No focal deficit present.     Mental Status: She is alert and oriented to person, place, and time. Mental status is at baseline.  Psychiatric:        Mood and Affect: Mood normal.        Behavior: Behavior normal.        Thought Content: Thought content normal.        Judgment: Judgment normal.     BP 131/88    Pulse 63    Temp 98.1 F (36.7 C) (Temporal)    Resp 18    Ht '5\' 6"'  (1.676 m)    Wt 154 lb 9.6 oz (70.1 kg)    SpO2 98%    BMI 24.95 kg/m  Wt Readings from Last 3 Encounters:  12/19/19 154 lb 9.6 oz (70.1 kg)  09/20/19 151 lb 6.4 oz (68.7 kg)  05/02/19 156 lb (70.8 kg)    There are no preventive care reminders to display for this patient.  There are no preventive care reminders to display for this patient.   Lab Results  Component Value Date   TSH 2.07 06/01/2019   Lab Results  Component Value Date   WBC 4.9 09/20/2019   HGB 15.8 (H) 09/20/2019   HCT 45.0 09/20/2019   MCV 98.9 09/20/2019   PLT 203 09/20/2019   Lab Results  Component Value Date   NA 141 09/20/2019   K 3.8 09/20/2019   CO2 25 09/20/2019   GLUCOSE 86 09/20/2019   BUN 11 09/20/2019   CREATININE 0.60 09/20/2019   BILITOT 0.7 09/20/2019   ALKPHOS 64 05/02/2019   AST 92 (H) 09/20/2019   ALT 105 (H) 09/20/2019   PROT 7.5 09/20/2019   ALBUMIN 4.7 05/02/2019   CALCIUM 10.5 (H) 09/20/2019   Lab Results  Component Value Date   CHOL 251 (H) 09/20/2019   Lab Results  Component Value Date   HDL 112 09/20/2019   Lab Results  Component Value Date   LDLCALC 123 (H) 09/20/2019   Lab Results  Component Value Date   TRIG 70 09/20/2019   Lab Results  Component Value Date   CHOLHDL 2.2 09/20/2019   No results found for: HGBA1C     Assessment & Plan:   Problem List Items Addressed This Visit    None    Visit Diagnoses    Mild  intermittent asthma with acute exacerbation       Relevant Medications   albuterol (VENTOLIN HFA) 108 (90 Base) MCG/ACT inhaler   fluticasone (FLOVENT HFA) 110 MCG/ACT inhaler   Allergic rhinitis, unspecified seasonality, unspecified trigger       Relevant Medications   fluticasone (FLONASE) 50 MCG/ACT nasal spray       Meds ordered this encounter  Medications   albuterol (VENTOLIN HFA) 108 (90 Base) MCG/ACT inhaler    Sig: Inhale 1-2 puffs into the lungs every 4 (four) hours as needed for wheezing or shortness of breath.    Dispense:  18 g    Refill:  0    Order Specific Question:   Supervising Provider    Answer:   Carlota Raspberry, JEFFREY R [[id number]]   fluticasone (FLONASE) 50 MCG/ACT nasal spray    Sig: Place 1-2 sprays into both nostrils daily.    Dispense:  16 g    Refill:  11    Order Specific Question:   Supervising Provider    Answer:   Carlota Raspberry, JEFFREY R [[id number]]   fluticasone (FLOVENT HFA) 110 MCG/ACT inhaler  Sig: Inhale 1-2 puffs into the lungs 2 (two) times daily.    Dispense:  24 g    Refill:  10    Order Specific Question:   Supervising Provider    Answer:   Carlota Raspberry, JEFFREY R [[id number]]   PLAN  Refill flovent, flonase, and albuterol. Resume use. Continue OTC allergy med use.   Return precautions reviewed  Will order chest CT given smoking hx and brown sputum, as well as worsening shob  Refer to pulmonology given these recurrent symptoms that seem to be gradually worsening. Patient may benefit from PFT   Patient encouraged to call clinic with any questions, comments, or concerns.  I spent 33 minutes with this patient, more than 50% of which was spent counseling and/or educating.  Maximiano Coss, NP

## 2019-12-27 ENCOUNTER — Encounter: Payer: Self-pay | Admitting: Internal Medicine

## 2019-12-27 ENCOUNTER — Ambulatory Visit: Payer: BC Managed Care – PPO | Admitting: Internal Medicine

## 2019-12-27 ENCOUNTER — Other Ambulatory Visit: Payer: Self-pay

## 2019-12-27 VITALS — BP 122/82 | HR 66 | Ht 66.0 in | Wt 154.4 lb

## 2019-12-27 DIAGNOSIS — E038 Other specified hypothyroidism: Secondary | ICD-10-CM | POA: Diagnosis not present

## 2019-12-27 DIAGNOSIS — E063 Autoimmune thyroiditis: Secondary | ICD-10-CM

## 2019-12-27 LAB — T4, FREE: Free T4: 0.65 ng/dL (ref 0.60–1.60)

## 2019-12-27 LAB — TSH: TSH: 2.61 u[IU]/mL (ref 0.35–4.50)

## 2019-12-27 LAB — T3, FREE: T3, Free: 4.2 pg/mL (ref 2.3–4.2)

## 2019-12-27 NOTE — Patient Instructions (Signed)
Please stop at the lab.  Please continue Armour 75 daily for now.  Take the thyroid hormone every day, with water, at least 30 minutes before breakfast, separated by at least 4 hours from: - acid reflux medications - calcium - iron - multivitamins  Please return in 6 months.

## 2019-12-27 NOTE — Progress Notes (Signed)
Patient ID: Samantha Golden, female   DOB: 11/15/1965, 54 y.o.   MRN: [id number]  This visit occurred during the SARS-CoV-2 public health emergency.  Safety protocols were in place, including screening questions prior to the visit, additional usage of staff PPE, and extensive cleaning of exam room while observing appropriate contact time as indicated for disinfecting solutions.   HPI  Samantha Golden is a 54 y.o.-year-old female, initially referred by Dr Phineas Real, returning for f/u for uncontrolled Hashimoto's hypothyroidism. Last visit 8 months ago.  Reviewed and addended history: Pt. has been dx with hypothyroidism in ~1990.   Previously on Synthroid DAW 225 mcg >> 300 mcg >> ... >> Currently on Armour  In 09/2017, she was on Armour 90 mg alternating with 120 mg every other day >> we had to decrease the dose to 90 mg daily.  Subsequent TSH was still slightly abnormal but improved.  Due to her symptoms, we continued the same dose of Armour at that time.  In 04/2019, we decreased the dose of Armour to 75 mg daily.  She takes Armour 75 mg daily: - in am - fasting - at least 30 min from b'fast - no Ca, Fe, PPIs, + magnesium, zinc, co-Q10 with dinner - not on Biotin  Reviewed her TFTs: Lab Results  Component Value Date   TSH 2.07 06/01/2019   TSH 0.14 (L) 04/19/2019   TSH 0.266 (L) 03/26/2019   TSH 0.79 09/18/2018   TSH 0.50 04/07/2018   TSH 0.26 (L) 12/02/2017   TSH 0.05 (L) 10/03/2017   TSH 0.03 (L) 04/07/2017   TSH 0.174 (L) 09/15/2016   TSH 0.61 04/08/2016   FREET4 0.51 (L) 06/01/2019   FREET4 0.70 04/19/2019   FREET4 0.98 03/26/2019   FREET4 0.59 (L) 04/07/2018   FREET4 0.62 12/02/2017   FREET4 0.65 10/03/2017   FREET4 0.92 04/07/2017   FREET4 1.04 09/15/2016   FREET4 1.08 03/26/2015   FREET4 0.39 (L) 09/03/2014  TSH 4.26 in 2012. She has been out of the LT4 for ~ 1 mo before the test in 10/2013.  She had elevated TPO antibodies, confirming Hashimoto's  thyroiditis: Component     Latest Ref Rng & Units 10/23/2013  Thyroperoxidase Ab SerPl-aCnc     <35.0 IU/mL 265.0 (H)  Thyroglobulin Ab     <40.0 IU/mL 38.2   She had palpitations when she took Adderall with the LT4 300 mcg.  Pt denies: - feeling nodules in neck - hoarseness - dysphagia - choking - SOB with lying down  She also has a history of elevated LFTs and ascites.  ROS: Constitutional: no weight gain/no weight loss, + fatigue, + subjective hyperthermia, no subjective hypothermia Eyes: no blurry vision, no xerophthalmia ENT: no sore throat, + see HPI Cardiovascular: no CP/no SOB/no palpitations/no leg swelling Respiratory: no cough/no SOB/no wheezing Gastrointestinal: no N/no V/+ D/+ C/no acid reflux Musculoskeletal: no muscle aches/ joint aches Skin: no rashes, no hair loss Neurological: no tremors/no numbness/no tingling/no dizziness  I reviewed pt's medications, allergies, PMH, social hx, family hx, and changes were documented in the history of present illness. Otherwise, unchanged from my initial visit note.  Past Medical History:  Diagnosis Date  . Abnormal cervical Papanicolaou smear    ? LASER TREATMENT   . Allergy   . Anxiety   . BRCA negative 08/09  . Cancer ([place])    skin ca  . Cervical dysplasia   . Hashimoto's disease    Past Surgical History:  Procedure Laterality Date  .  ADENOIDECTOMY    . CERVIX LESION DESTRUCTION    . COLPOSCOPY    . INTRAUTERINE DEVICE (IUD) INSERTION  09/04/2014   Mirena  . SKIN CANCER REMOVED  09/2019   History   Social History  . Marital Status: Divorced    Spouse Name: N/A    Number of Children: 3   Occupational History  . teacher   Social History Main Topics  . Smoking status: Former Research scientist (life sciences)  . Smokeless tobacco: Never Used  . Alcohol Use: 1.0 oz/week    2 drink(s) per week  . Drug Use: No  . Sexual Activity: Yes    Birth Control/ Protection: IUD     Comment: ParaGard 08/2012   Current Outpatient  Medications on File Prior to Visit  Medication Sig Dispense Refill  . albuterol (VENTOLIN HFA) 108 (90 Base) MCG/ACT inhaler Inhale 1-2 puffs into the lungs every 4 (four) hours as needed for wheezing or shortness of breath. 18 g 0  . ARMOUR THYROID 15 MG tablet TAKE 1 TABLET ONCE DAILY. 45 tablet 0  . ARMOUR THYROID 60 MG tablet TAKE 1 TABLET ONCE DAILY BEFORE BREAKFAST. 45 tablet 0  . fluticasone (FLONASE) 50 MCG/ACT nasal spray Place 1-2 sprays into both nostrils daily. 16 g 11  . fluticasone (FLOVENT HFA) 110 MCG/ACT inhaler Inhale 1-2 puffs into the lungs 2 (two) times daily. 24 g 10   Current Facility-Administered Medications on File Prior to Visit  Medication Dose Route Frequency Provider Last Rate Last Admin  . levonorgestrel (MIRENA) 20 MCG/24HR IUD   Intrauterine Once Fontaine, Belinda Block, MD       Allergies  Allergen Reactions  . Latex Shortness Of Breath, Itching and Rash    Wheezing when she removes latex gloves after working, does not use anything latex !   . Augmentin [Amoxicillin-Pot Clavulanate]   . Other     "CLEANING MATERIALS" (bleach, ammonia)   Family History  Problem Relation Age of Onset  . Breast cancer Mother        Age 52  . Breast cancer Maternal Grandmother        Age 25's  . Diabetes Maternal Grandfather   . Healthy Father   . Alcohol abuse Brother   . Cancer Paternal Aunt        Cervical  . Diabetes Paternal Uncle   . Breast cancer Paternal Aunt        Age 48's  . Colon cancer Neg Hx   . Esophageal cancer Neg Hx   . Stomach cancer Neg Hx   . Pancreatic cancer Neg Hx   . Liver disease Neg Hx   . Inflammatory bowel disease Neg Hx    PE: BP 122/82 (BP Location: Left Arm, Patient Position: Sitting, Cuff Size: Normal)   Pulse 66   Ht '5\' 6"'  (1.676 m)   Wt 154 lb 6.4 oz (70 kg)   SpO2 96%   BMI 24.92 kg/m  Body mass index is 24.92 kg/m.  Wt Readings from Last 3 Encounters:  12/27/19 154 lb 6.4 oz (70 kg)  12/19/19 154 lb 9.6 oz (70.1 kg)   09/20/19 151 lb 6.4 oz (68.7 kg)   Constitutional: normal weight, in NAD Eyes: PERRLA, EOMI, no exophthalmos ENT: moist mucous membranes, no thyromegaly, no cervical lymphadenopathy Cardiovascular: RRR, No MRG Respiratory: CTA B Gastrointestinal: abdomen soft, NT, ND, BS+ Musculoskeletal: no deformities, strength intact in all 4 Skin: moist, warm, no rashes Neurological: no tremor with outstretched hands, DTR normal in all  4  ASSESSMENT: 1. Hashimoto's Hypothyroidism - did not feel good on Synthroid  PLAN:  1. Patient with longstanding, uncontrolled, Hashimoto's hypothyroidism, on Armour Thyroid.  She tried generic levothyroxine and brand-name Synthroid but did not feel good on these.  She felt better on Armour, but continues to have nonspecific symptoms, possibly also related to menopause.  We decreased the dose of Armour from 105 mg daily gradually to 75 mg daily (at last visit). -She is usually reticent to decrease the dose of Viagra, even after discussions about possible risks from taking too much thyroid hormones, including: Palpitations: Tremors, anxiety, heat intolerance, insomnia, weight loss and in the long run: Arrhythmia, hypercoagulability with increased risk of strokes and heart attacks, osteoporosis. - latest thyroid labs reviewed with pt >> normal, after decreasing the dose of Armour: Lab Results  Component Value Date   TSH 2.07 06/01/2019   - she continues on Armour 75 daily - pt feels good on this dose, but she is still has some fatigue and is wondering whether this is related to the dose of Armour or other conditions.  We discussed in detail about Hashimoto's thyroiditis -mechanism, evolution, and treatments available.   -We also discussed about best diet in improving her autoimmune condition.  I suggested a plant-based diet and recommended against the keto diet or a high-protein diet.  Explained how each of these influenceautoimmunity. - we discussed about taking the  thyroid hormone every day, with water, >30 minutes before breakfast, separated by >4 hours from acid reflux medications, calcium, iron, multivitamins. Pt. is taking it correctly. - will check thyroid tests today: TSH, free T3 and fT4.  She has multiple questions about how to interpret the these tests, to which I answered to the best of my ability.   - If labs are abnormal, she will need to return for repeat TFTs in 1.5 months -I will see her back in 6 months  - Total time spent for the visit: 25 min, in obtaining medical information from the chart, reviewing her  previous labs, imaging evaluations, and treatments, reviewing her symptoms, counseling her about her condition (please see the discussed topics above), and developing a plan to further investigate and treat it; she had a number of questions which I addressed.  Component     Latest Ref Rng & Units 12/27/2019  TSH     0.35 - 4.50 uIU/mL 2.61  T4,Free(Direct)     0.60 - 1.60 ng/dL 0.65  Triiodothyronine,Free,Serum     2.3 - 4.2 pg/mL 4.2  Thyroperoxidase Ab SerPl-aCnc     <9 IU/mL 272 (H)  Thyroglobulin Ab     < or = 1 IU/mL 5 (H)   TFTs are normal.  TPO antibodies are still elevated, approximately the same as before, all thyroglobulin antibodies are slightly high.  Philemon Kingdom, MD PhD [facility]

## 2019-12-28 LAB — THYROGLOBULIN ANTIBODY: Thyroglobulin Ab: 5 [IU]/mL — ABNORMAL HIGH

## 2019-12-28 LAB — THYROID PEROXIDASE ANTIBODY: Thyroperoxidase Ab SerPl-aCnc: 272 IU/mL — ABNORMAL HIGH (ref <9)

## 2020-01-03 ENCOUNTER — Other Ambulatory Visit: Payer: BC Managed Care – PPO

## 2020-01-09 ENCOUNTER — Institutional Professional Consult (permissible substitution): Payer: BC Managed Care – PPO | Admitting: Pulmonary Disease

## 2020-01-18 ENCOUNTER — Ambulatory Visit
Admission: RE | Admit: 2020-01-18 | Discharge: 2020-01-18 | Disposition: A | Discharge status: Home / Self Care | Payer: Self-pay | Source: Ambulatory Visit | Attending: Registered Nurse | Admitting: Registered Nurse

## 2020-01-18 DIAGNOSIS — J4521 Mild intermittent asthma with (acute) exacerbation: Secondary | ICD-10-CM

## 2020-01-18 DIAGNOSIS — R059 Cough, unspecified: Secondary | ICD-10-CM

## 2020-01-21 ENCOUNTER — Telehealth: Payer: Self-pay | Admitting: Family Medicine

## 2020-01-21 NOTE — Telephone Encounter (Signed)
Pt was seen on 12/19/19 by Orland Mustard. She had a CT Chest Wo Contrast (Accession [id number]) (Order [id number]) done on 01/18/20. Which says she has an-Upper Abdomen: Bilateral renal calculi, the largest at the left kidney measures 2.5 mm. She would like a referral placed for her to go to a urologist. She does not need a referral for a pulmonologist. Please advise pt at [phone].

## 2020-01-22 ENCOUNTER — Other Ambulatory Visit: Payer: Self-pay | Admitting: Registered Nurse

## 2020-01-22 DIAGNOSIS — N200 Calculus of kidney: Secondary | ICD-10-CM

## 2020-01-22 NOTE — Progress Notes (Signed)
Imaging noted large renal calculus Pt requests referral to urology  Kathrin Ruddy, NP

## 2020-01-22 NOTE — Telephone Encounter (Signed)
Please advise, pt requesting referral to uroology

## 2020-01-25 ENCOUNTER — Other Ambulatory Visit: Payer: Self-pay | Admitting: Internal Medicine

## 2020-01-25 ENCOUNTER — Other Ambulatory Visit: Payer: Self-pay

## 2020-01-25 ENCOUNTER — Telehealth: Payer: Self-pay | Admitting: Family Medicine

## 2020-01-25 DIAGNOSIS — N200 Calculus of kidney: Secondary | ICD-10-CM

## 2020-01-25 NOTE — Telephone Encounter (Signed)
Referral placed.

## 2020-01-25 NOTE — Telephone Encounter (Signed)
Patient calling back again stilll  waiting to hear from [place] . Patient is wanting a referral as soon as possible for kidney stones     Please advise

## 2020-01-29 ENCOUNTER — Telehealth: Payer: Self-pay | Admitting: Family Medicine

## 2020-01-29 ENCOUNTER — Ambulatory Visit: Payer: BC Managed Care – PPO | Admitting: Gastroenterology

## 2020-01-29 NOTE — Telephone Encounter (Signed)
Pt is calling in concerned with two bills she has gotten from [place]. The first issue is from 03/16/19 a virtual visit with Carlota Raspberry.[employer] denied this claim because it was coded as a lifestyle choice. The appointment was for a respiratory condition,but then turned to her blood work done for her liver enzyme. She stated she was drinking a lot maybe. She feels this was coded wrong because the appointment  was initially for her resp issue.   2nd issue-pt saw Orland Mustard on 12/19/19 for Mild intermittent asthma with acute exacerbation and got a referral for a ct scan that was done on 01/18/20. [employer] has deemed this procedure not medically necessary and will not pay. She feels this was coded wrong. She was seen for a respiratory issue with chest pains that could have scarring involved.  She feels this is medically necessary.   Please advise pt at [phone].  I also gave her [facility] billing's #.

## 2020-02-04 NOTE — Telephone Encounter (Signed)
Please advise on the codes from pts office visit. Was it billed correctly based on the visits? If it is, then I will call pt that it was billed correctly and if she has any additional questions to contact billing.

## 2020-02-05 ENCOUNTER — Telehealth: Payer: Self-pay

## 2020-02-05 NOTE — Telephone Encounter (Signed)
Pt referral for Uro was sent to [place] pt requesting closer office. Please assist with this would like [facility] if possible

## 2020-02-05 NOTE — Telephone Encounter (Signed)
Pt has been informed.

## 2020-02-05 NOTE — Telephone Encounter (Signed)
Pt CT was denied due to insufficient Dx pt has had chest pains and cough over the past several years. Many cases of bronchitis and other wonders if this can be recoded for a more covered Dx

## 2020-02-05 NOTE — Telephone Encounter (Signed)
I reviewed the January 8 visit.  Chief complaint was medical management of chronic issues and refill on meds.  Elevated liver function tests, and abdominal cramping were part of that visit, but we did discuss other chronic issues.  I have changed the primary diagnosis to abdominal cramping to see if that may help.  Let me know.

## 2020-02-06 ENCOUNTER — Telehealth: Payer: Self-pay | Admitting: Gastroenterology

## 2020-02-07 NOTE — Telephone Encounter (Addendum)
I called and explained to pt that I can not give a note when she has not been seen in the office recently excusing her from work. I will provide a note with diagnoses that she can provide to her employer. She is scheduled to follow-up with Dr.Mansouraty in a few weeks, if further notes or letter are needed at that time then we can discuss with Dr. Durel Salts.

## 2020-02-08 NOTE — Telephone Encounter (Signed)
Pt wants a call back to discuss note that she needs

## 2020-02-08 NOTE — Telephone Encounter (Signed)
Note placed and front desk for pt to pick up. Pt has been informed.

## 2020-02-11 ENCOUNTER — Encounter: Payer: Self-pay | Admitting: Pulmonary Disease

## 2020-02-11 ENCOUNTER — Ambulatory Visit: Payer: BC Managed Care – PPO | Admitting: Pulmonary Disease

## 2020-02-11 ENCOUNTER — Other Ambulatory Visit: Payer: Self-pay

## 2020-02-11 VITALS — BP 110/62 | HR 84 | Temp 97.5°F | Ht 66.0 in | Wt 154.0 lb

## 2020-02-11 DIAGNOSIS — J4530 Mild persistent asthma, uncomplicated: Secondary | ICD-10-CM | POA: Insufficient documentation

## 2020-02-11 MED ORDER — FLUTICASONE-SALMETEROL 250-50 MCG/DOSE IN AEPB: 1.0000 | INHALATION_SPRAY | Freq: Two times a day (BID) | RESPIRATORY_TRACT | 6 refills | Status: DC

## 2020-02-11 NOTE — Patient Instructions (Addendum)
Asthma STOP Flovent START Advair 230/50 ONE puff TWICE a day CONTINUE Albuterol and/or Advair as needed for shortness of breath or wheezing  Will arrange for Pulmonary function tests prior to next visit  Ground glass opacities Likely inflammatory, less likely infectious. I suspect this will self-resolve No recommendations for follow-up imaging  Follow-up in 3 months

## 2020-02-11 NOTE — Progress Notes (Signed)
Subjective:   PATIENT ID: Samantha Golden GENDER: female DOB: [birth year], MRN: [id number]   HPI  Chief Complaint  Patient presents with  . Consult    chronic bronchitis, chest pain, shortness of breath with exertion, wheezing, wake up at night go to bathroom she is more short of breath after waking up and walking.    Reason for Visit: New consult for shortness of breath  [name] is a 54 year old female who presents as a referral from her PCP. Reviewed note from 12/19/19 by NP Orland Mustard noting she has yearly allergies and chronic cough with sputum production. She has had recently worsening shortness of breath as well. She has a visit diagnosis of intermittent asthma and on flovent 117mg and PRN albuterol and on flonase for her allergic rhinitis.  She reports she has had episodes of bronchitis that will convert to pneumonia. Her asthma has been managed by her with flovent and albuterol with good results. She recently had sputum production that is pinkish but usually it is clear sputum production that occurs daily. Her bronchitis occurs from October to January every year. Denies shortness of breath with exertion during the day however has noticed that when walking in the night for bathroom breaks, she notices she is wheezing and more short of breath. NP MOrland Mustardordered CT for evaluation.  Social History: Negligible smoking history. 3 pack-years.  I have personally reviewed patient's past medical/family/social history, allergies, current medications.  Past Medical History:  Diagnosis Date  . Abnormal cervical Papanicolaou smear    ? LASER TREATMENT   . Allergy   . Anxiety   . BRCA negative 08/09  . [place]    skin ca  . Cervical dysplasia   . Hashimoto's disease      Family History  Problem Relation Age of Onset  . Breast cancer Mother        Age 52 . Breast cancer Maternal Grandmother        Age 52's . Diabetes Maternal Grandfather   . Healthy Father   .  Alcohol abuse Brother   . Cancer Paternal Aunt        Cervical  . Diabetes Paternal Uncle   . Breast cancer Paternal Aunt        Age 52's . Colon cancer Neg Hx   . Esophageal cancer Neg Hx   . Stomach cancer Neg Hx   . Pancreatic cancer Neg Hx   . Liver disease Neg Hx   . Inflammatory bowel disease Neg Hx      Social History   Occupational History  . Not on file  Tobacco Use  . Smoking status: Former Smoker    Packs/day: 0.25    Years: 10.00    Pack years: 2.50    Types: Cigarettes    Quit date: 07/10/2011    Years since quitting: 8.5  . Smokeless tobacco: Never Used  Vaping Use  . Vaping Use: Never used  Substance and Sexual Activity  . Alcohol use: Yes    Alcohol/week: 28.0 - 42.0 standard drinks    Types: 28 - 42 Shots of liquor per week    Comment: 5 to 6 drinks per day  . Drug use: No  . Sexual activity: Yes    Birth control/protection: I.U.D.    Comment: MIRENA 09/04/2014- first sexual encounter 20 , fewer than 5 partners    Allergies  Allergen Reactions  . Latex Shortness Of Breath, Itching and  Rash    Wheezing when she removes latex gloves after working, does not use anything latex !   . Augmentin [Amoxicillin-Pot Clavulanate]   . Other     "CLEANING MATERIALS" (bleach, ammonia)     Outpatient Medications Prior to Visit  Medication Sig Dispense Refill  . albuterol (VENTOLIN HFA) 108 (90 Base) MCG/ACT inhaler Inhale 1-2 puffs into the lungs every 4 (four) hours as needed for wheezing or shortness of breath. 18 g 0  . ARMOUR THYROID 15 MG tablet TAKE 1 TABLET ONCE DAILY. 45 tablet 3  . ARMOUR THYROID 60 MG tablet TAKE 1 TABLET ONCE DAILY BEFORE BREAKFAST. 45 tablet 3  . fluticasone (FLONASE) 50 MCG/ACT nasal spray Place 1-2 sprays into both nostrils daily. 16 g 11  . fluticasone (FLOVENT HFA) 110 MCG/ACT inhaler Inhale 1-2 puffs into the lungs 2 (two) times daily. 24 g 10   Facility-Administered Medications Prior to Visit  Medication Dose Route Frequency  Provider Last Rate Last Admin  . levonorgestrel (MIRENA) 20 MCG/24HR IUD   Intrauterine Once Fontaine, Belinda Block, MD        Review of Systems  Constitutional: Negative for chills, diaphoresis, fever, malaise/fatigue and weight loss.  HENT: Negative for congestion, ear pain and sore throat.   Respiratory: Positive for cough, sputum production, shortness of breath and wheezing. Negative for hemoptysis.   Cardiovascular: Negative for chest pain, palpitations and leg swelling.  Gastrointestinal: Negative for abdominal pain, heartburn and nausea.  Genitourinary: Negative for frequency.  Musculoskeletal: Negative for joint pain and myalgias.  Skin: Negative for itching and rash.  Neurological: Negative for dizziness, weakness and headaches.  Endo/Heme/Allergies: Positive for environmental allergies. Does not bruise/bleed easily.  Psychiatric/Behavioral: Negative for depression. The patient is not nervous/anxious.      Objective:   Vitals:   02/11/20 1605  BP: 110/62  Pulse: 84  Temp: (!) 97.5 F (36.4 C)  TempSrc: Temporal  SpO2: 97%  Weight: 154 lb (69.9 kg)  Height: _0  (1.676 m)      Physical Exam: General: Well-appearing, no acute distress HENT: [place], AT Eyes: EOMI, no scleral icterus Respiratory: Clear to auscultation bilaterally.  No crackles, wheezing or rales Cardiovascular: RRR, -M/R/G, no JVD GI: BS+, soft, nontender Extremities:-Edema,-tenderness Neuro: AAO x4, CNII-XII grossly intact Skin: Intact, no rashes or bruising Psych: Normal mood, normal affect  Data Reviewed:  Imaging: CT Chest 01/18/20 - very minimal GGO in RML and lingula. Area of air trapping in RLL  PFT: None on file  Labs: CBC    Component Value Date/Time   WBC 4.9 09/20/2019 0912   RBC 4.55 09/20/2019 0912   HGB 15.8 (H) 09/20/2019 0912   HGB 15.3 09/15/2016 0952   HCT 45.0 09/20/2019 0912   HCT 44.8 09/15/2016 0952   PLT 203 09/20/2019 0912   PLT 219 09/15/2016 0952   MCV 98.9  09/20/2019 0912   MCV 99 (H) 09/15/2016 0952   MCH 34.7 (H) 09/20/2019 0912   MCHC 35.1 09/20/2019 0912   RDW 12.0 09/20/2019 0912   RDW 12.6 09/15/2016 0952   LYMPHSABS 1,201 09/20/2019 0912   LYMPHSABS 1.3 09/15/2016 0952   MONOABS 0.6 12/02/2017 1112   EOSABS 181 09/20/2019 0912   EOSABS 0.1 09/15/2016 0952   BASOSABS 29 09/20/2019 0912   BASOSABS 0.0 09/15/2016 0952   Absolute eos: 181  Imaging, labs and test noted above have been reviewed independently by me.     Assessment & Plan:   Discussion: 54 year old female  with hx of intermittent asthma that becomes more persistent in the fall and winter. Counseled on benefit of stepping up on therapy for increased asthma symptoms. Reviewed CT with patient and addressed questions. Would not recommend further imaging as this will likely self resolve with minimal long term effect  Asthma - uncontrolled STOP Flovent START Advair 230/50 ONE puff TWICE a day CONTINUE Albuterol and/or Advair as needed for shortness of breath or wheezing  Will arrange for Pulmonary function tests prior to next visit  Ground glass opacities Likely inflammatory, less likely infectious. I suspect this will self-resolve No recommendations for follow-up imaging Follow-up in 3 months  Health Maintenance  There is no immunization history on file for this patient. CT Lung Screen - not indicated. Insignificant smoking history  Orders Placed This Encounter  Procedures  . Pulmonary function test    Standing Status:   Future    Standing Expiration Date:   02/10/2021    Scheduling Instructions:     Please schedule in 3 months along with follow up with Dr Loanne Drilling    Order Specific Question:   Where should this test be performed?    Answer:   [facility]    Order Specific Question:   Full PFT: includes the following: basic spirometry, spirometry pre & post bronchodilator, diffusion capacity (DLCO), lung volumes    Answer:   Full PFT   Meds ordered this  encounter  Medications  . Fluticasone-Salmeterol (ADVAIR DISKUS) 250-50 MCG/DOSE AEPB    Sig: Inhale 1 puff into the lungs in the morning and at bedtime.    Dispense:  60 each    Refill:  6    Return in about 3 months (around 05/11/2020).  I have spent a total time of 45-minutes on the day of the appointment reviewing prior documentation, coordinating care and discussing medical diagnosis and plan with the patient/family. Imaging, labs and tests included in this note have been reviewed and interpreted independently by me.  Ewa Gentry, MD [facility] 02/11/2020 4:03 PM  Office Number [phone]

## 2020-02-13 ENCOUNTER — Telehealth: Payer: Self-pay | Admitting: Gastroenterology

## 2020-02-13 NOTE — Telephone Encounter (Signed)
Pt is requesting a call back from a nurse to discuss her IBS symptoms, pt would like some recommendations as to what she can do prior to her upcoming appt.

## 2020-02-13 NOTE — Telephone Encounter (Signed)
Pt states she requested a letter for work and it mentioned chronic diarrhea. Pt states she would like this changed to states IBS symptoms as that is less embarrassing. Letter changed and left up front for pickup. Pt also states she is having a lot of bloating despite trying to watch what she eats. She wanted to know if there is anything available OTC. Suggested pt try gas-x otc and/or IB guard. Pt verbalized understanding.

## 2020-02-22 ENCOUNTER — Telehealth: Payer: Self-pay | Admitting: *Deleted

## 2020-02-22 MED ORDER — SULFAMETHOXAZOLE-TRIMETHOPRIM 800-160 MG PO TABS: 1.0000 | ORAL_TABLET | Freq: Two times a day (BID) | ORAL | 0 refills | Status: DC

## 2020-02-22 NOTE — Telephone Encounter (Signed)
Samantha Golden patient) Patient called c/o UTI, urgency and pressure, not burning with urination yet. Patient asked if Rx could be sent to [facility]? Please advise

## 2020-02-22 NOTE — Telephone Encounter (Signed)
Bactrim DS 1 tab PO daily x 3 days.  #6, no refill.

## 2020-02-22 NOTE — Telephone Encounter (Signed)
Dr.Lavoie gave me a verbal order to sent bactrim ds bid x 3 days. Rx sent patient aware.

## 2020-03-25 ENCOUNTER — Ambulatory Visit: Payer: BC Managed Care – PPO | Admitting: Gastroenterology

## 2020-07-03 ENCOUNTER — Ambulatory Visit: Payer: Self-pay | Admitting: Internal Medicine

## 2020-07-03 ENCOUNTER — Encounter: Payer: Self-pay | Admitting: Internal Medicine

## 2020-07-03 ENCOUNTER — Other Ambulatory Visit: Payer: Self-pay

## 2020-07-03 VITALS — BP 120/82 | HR 70 | Ht 66.0 in | Wt 153.6 lb

## 2020-07-03 DIAGNOSIS — E038 Other specified hypothyroidism: Secondary | ICD-10-CM

## 2020-07-03 DIAGNOSIS — E063 Autoimmune thyroiditis: Secondary | ICD-10-CM

## 2020-07-03 LAB — T4, FREE: Free T4: 0.45 ng/dL — ABNORMAL LOW (ref 0.60–1.60)

## 2020-07-03 LAB — T3, FREE: T3, Free: 3.6 pg/mL (ref 2.3–4.2)

## 2020-07-03 LAB — TSH: TSH: 2.42 u[IU]/mL (ref 0.35–4.50)

## 2020-07-03 NOTE — Progress Notes (Signed)
Patient ID: Samantha Golden, female   DOB: 01-01-1966, 55 y.o.   MRN: [id number]  This visit occurred during the SARS-CoV-2 public health emergency.  Safety protocols were in place, including screening questions prior to the visit, additional usage of staff PPE, and extensive cleaning of exam room while observing appropriate contact time as indicated for disinfecting solutions.   HPI  Samantha Golden is a 55 y.o.-year-old female, initially referred by Dr Phineas Real, returning for f/u for uncontrolled Hashimoto's hypothyroidism. Last visit 6 months ago.  Interim history: Since last visit, she had bilateral nephrolithiasis on CT from 01/2020. 3 weeks ago, she had more fatigue and brain fog  - persistent. These are chronic for her. She is not sleeping well. Also, forgetful. She also has dry skin and IBS.   Reviewed and addended history: Pt. has been dx with hypothyroidism in ~1990 (college).   Previously on Synthroid DAW 225 mcg >> 300 mcg >> ... >> Currently on Armour  In 09/2017, she was on Armour 90 mg alternating with 120 mg every other day >> we had to decrease the dose to 90 mg daily.  Subsequent TSH was still slightly abnormal but improved.  Due to her symptoms, we continued the same dose of Armour at that time.  In 04/2019, we decreased the dose of Armour to 75 mg daily.  She takes Armour 75 mg daily: - in am - fasting - at least  45 from coffee and b'fast - no Ca, Fe, PPIs - not on Biotin  Reviewed her TFTs: Lab Results  Component Value Date   TSH 2.61 12/27/2019   TSH 2.07 06/01/2019   TSH 0.14 (L) 04/19/2019   TSH 0.266 (L) 03/26/2019   TSH 0.79 09/18/2018   TSH 0.50 04/07/2018   TSH 0.26 (L) 12/02/2017   TSH 0.05 (L) 10/03/2017   TSH 0.03 (L) 04/07/2017   TSH 0.174 (L) 09/15/2016   FREET4 0.65 12/27/2019   FREET4 0.51 (L) 06/01/2019   FREET4 0.70 04/19/2019   FREET4 0.98 03/26/2019   FREET4 0.59 (L) 04/07/2018   FREET4 0.62 12/02/2017   FREET4 0.65 10/03/2017    FREET4 0.92 04/07/2017   FREET4 1.04 09/15/2016   FREET4 1.08 03/26/2015  TSH 4.26 in 2012. She has been out of the LT4 for ~ 1 mo before the test in 10/2013.  She had elevated TPO antibodies, confirming Hashimoto's thyroiditis:  Component     Latest Ref Rng & Units 12/27/2019  Thyroperoxidase Ab SerPl-aCnc     <9 IU/mL 272 (H)  Thyroglobulin Ab     < or = 1 IU/mL 5 (H)   Component     Latest Ref Rng & Units 10/23/2013  Thyroperoxidase Ab SerPl-aCnc     <35.0 IU/mL 265.0 (H)  Thyroglobulin Ab     <40.0 IU/mL 38.2   She had palpitations when she took Adderall with the LT4 300 mcg.  Pt denies: - feeling nodules in neck - hoarseness - dysphagia - choking - SOB with lying down  She also has a history of elevated LFTs and ascites.  ROS: Constitutional: no weight gain/no weight loss, + fatigue, + subjective hyperthermia, no subjective hypothermia Eyes: no blurry vision, no xerophthalmia ENT: no sore throat, + see HPI Cardiovascular: no CP/no SOB/no palpitations/no leg swelling Respiratory: no cough/no SOB/no wheezing Gastrointestinal: no N/no V/+ D/+ C/no acid reflux Musculoskeletal: no muscle aches/ joint aches Skin: no rashes, no hair loss Neurological: no tremors/no numbness/no tingling/no dizziness  I reviewed pt's medications, allergies,  PMH, social hx, family hx, and changes were documented in the history of present illness. Otherwise, unchanged from my initial visit note.  Past Medical History:  Diagnosis Date  . Abnormal cervical Papanicolaou smear    ? LASER TREATMENT   . Allergy   . Anxiety   . Asthma   . BRCA negative 08/09  . Cancer ([place]    skin ca  . Cervical dysplasia   . Chronic kidney disease   . Hashimoto's disease   . Hypothyroidism   . Pneumonia    Past Surgical History:  Procedure Laterality Date  . ADENOIDECTOMY    . CERVIX LESION DESTRUCTION    . COLPOSCOPY    . INTRAUTERINE DEVICE (IUD) INSERTION  09/04/2014   Mirena  . SKIN CANCER  REMOVED  09/2019   History   Social History  . Marital Status: Divorced    Spouse Name: N/A    Number of Children: 3   Occupational History  . teacher   Social History Main Topics  . Smoking status: Former Research scientist (life sciences)  . Smokeless tobacco: Never Used  . Alcohol Use: 1.0 oz/week    2 drink(s) per week  . Drug Use: No  . Sexual Activity: Yes    Birth Control/ Protection: IUD     Comment: ParaGard 08/2012   Current Outpatient Medications on File Prior to Visit  Medication Sig Dispense Refill  . albuterol (VENTOLIN HFA) 108 (90 Base) MCG/ACT inhaler Inhale 1-2 puffs into the lungs every 4 (four) hours as needed for wheezing or shortness of breath. 18 g 0  . ARMOUR THYROID 15 MG tablet TAKE 1 TABLET ONCE DAILY. 45 tablet 3  . ARMOUR THYROID 60 MG tablet TAKE 1 TABLET ONCE DAILY BEFORE BREAKFAST. 45 tablet 3  . fluticasone (FLONASE) 50 MCG/ACT nasal spray Place 1-2 sprays into both nostrils daily. 16 g 11  . Fluticasone-Salmeterol (ADVAIR DISKUS) 250-50 MCG/DOSE AEPB Inhale 1 puff into the lungs in the morning and at bedtime. 60 each 6   Current Facility-Administered Medications on File Prior to Visit  Medication Dose Route Frequency Provider Last Rate Last Admin  . levonorgestrel (MIRENA) 20 MCG/24HR IUD   Intrauterine Once Fontaine, Belinda Block, MD       Allergies  Allergen Reactions  . Latex Shortness Of Breath, Itching and Rash    Wheezing when she removes latex gloves after working, does not use anything latex !   . Augmentin [Amoxicillin-Pot Clavulanate]   . Other     "CLEANING MATERIALS" (bleach, ammonia)   Family History  Problem Relation Age of Onset  . Breast cancer Mother        Age 33  . Breast cancer Maternal Grandmother        Age 36's  . Diabetes Maternal Grandfather   . Healthy Father   . Alcohol abuse Brother   . Cancer Paternal Aunt        Cervical  . Diabetes Paternal Uncle   . Breast cancer Paternal Aunt        Age 88's  . Colon cancer Neg Hx   .  Esophageal cancer Neg Hx   . Stomach cancer Neg Hx   . Pancreatic cancer Neg Hx   . Liver disease Neg Hx   . Inflammatory bowel disease Neg Hx    PE: BP 120/82 (BP Location: Right Arm, Patient Position: Sitting, Cuff Size: Normal)   Pulse 70   Ht 5' 6" (1.676 m)   Wt 153 lb 9.6 oz (69.7  kg)   SpO2 96%   BMI 24.79 kg/m  Body mass index is 24.79 kg/m.  Wt Readings from Last 3 Encounters:  07/03/20 153 lb 9.6 oz (69.7 kg)  02/11/20 154 lb (69.9 kg)  12/27/19 154 lb 6.4 oz (70 kg)   Constitutional: normal weight, in NAD Eyes: PERRLA, EOMI, no exophthalmos ENT: moist mucous membranes, no thyromegaly, no cervical lymphadenopathy Cardiovascular: RRR, No MRG Respiratory: CTA B Gastrointestinal: abdomen soft, NT, ND, BS+ Musculoskeletal: no deformities, strength intact in all 4 Skin: moist, warm, no rashes Neurological: no tremor with outstretched hands, DTR normal in all 4  ASSESSMENT: 1. Hashimoto's Hypothyroidism - did not feel good on Synthroid, Levothyroxine  PLAN:  1. Patient with longstanding, uncontrolled, Hashimoto's hypothyroidism, on Armour Thyroid.  She tried generic levothyroxine and brand-name Synthroid but did not feel good on this.  She felt better on Armour, but she continues to have nonspecific symptoms, possibly also related to menopause.  We decreased the dose of Armour gradually from 105 mg daily to 75 mg daily.  On this dose, her TFTs remained normal at last 2 checks. -  She is usually resistant to decrease the dose of Armour even after discussion about possible side effects to include arrhythmia, hypercoagulability with increased risk of strokes and heart attacks, osteoporosis. - latest thyroid labs reviewed with pt >> normal: Lab Results  Component Value Date   TSH 2.61 12/27/2019   - she continues on Armour 75 mg daily - pt feels good on this dose but continues to have fatigue, which is chronic for her.  She also describes a variety of other symptoms  including inability to lose weight, diarrhea alternating with constipation, dry skin, memory problems and mental fog. - we reviewed her antithyroid antibodies and discussed about different ways to improve her immune system including improving diet (reducing animal protein, casein, and she is already reducing gluten), also, increasing exercise, improving sleep and rest.  I explained that we have no good, consistent methods to improve her antithyroid antibodies, but we can again try selenium, which she has tried in the past.  She would like to retry this.  I recommended 200 mcg daily.  We can recheck her antibodies at next visit. - we discussed about taking the thyroid hormone every day, with water, >30 minutes before breakfast, separated by >4 hours from acid reflux medications, calcium, iron, multivitamins. Pt. is taking it correctly. - will check thyroid tests today: TSH, free T3 and fT4 - If labs are abnormal, she will need to return for repeat TFTs in 1.5 months -I will see her back in 6 months  Needs refills - 3 mo.   Component     Latest Ref Rng & Units 07/03/2020  TSH     0.35 - 4.50 uIU/mL 2.42  T4,Free(Direct)     0.60 - 1.60 ng/dL 0.45 (L)  Triiodothyronine,Free,Serum     2.3 - 4.2 pg/mL 3.6  Thyroid tests are at goal.  Philemon Kingdom, MD PhD [facility]

## 2020-07-03 NOTE — Patient Instructions (Addendum)
Please stop at the lab.  Please continue Armour 75 daily for now.  Take the thyroid hormone every day, with water, at least 30 minutes before breakfast, separated by at least 4 hours from: - acid reflux medications - calcium - iron - multivitamins  You can retry Selenium 200 mcg daily.  Please return in 6 months.

## 2020-07-04 MED ORDER — ARMOUR THYROID 60 MG PO TABS: ORAL_TABLET | ORAL | 3 refills | Status: DC

## 2020-07-04 MED ORDER — ARMOUR THYROID 15 MG PO TABS
15.0000 mg | ORAL_TABLET | Freq: Every day | ORAL | 3 refills | Status: DC
Start: 2020-07-04 — End: 2021-08-06

## 2020-07-07 ENCOUNTER — Encounter: Payer: Self-pay | Admitting: Internal Medicine

## 2020-09-12 ENCOUNTER — Other Ambulatory Visit: Payer: Self-pay | Admitting: Nephrology

## 2020-09-12 DIAGNOSIS — R93429 Abnormal radiologic findings on diagnostic imaging of unspecified kidney: Secondary | ICD-10-CM

## 2020-09-22 ENCOUNTER — Ambulatory Visit: Payer: BC Managed Care – PPO | Admitting: Nurse Practitioner

## 2020-09-24 ENCOUNTER — Ambulatory Visit
Admission: RE | Admit: 2020-09-24 | Discharge: 2020-09-24 | Disposition: A | Discharge status: Home / Self Care | Payer: BC Managed Care – PPO | Source: Ambulatory Visit | Attending: Nephrology | Admitting: Nephrology

## 2020-09-24 DIAGNOSIS — R93429 Abnormal radiologic findings on diagnostic imaging of unspecified kidney: Secondary | ICD-10-CM

## 2020-10-23 ENCOUNTER — Other Ambulatory Visit: Payer: Self-pay

## 2020-10-23 ENCOUNTER — Ambulatory Visit (HOSPITAL_COMMUNITY)
Admission: EM | Admit: 2020-10-23 | Discharge: 2020-10-23 | Disposition: A | Discharge status: Home / Self Care | Payer: No Typology Code available for payment source | Attending: Family Medicine | Admitting: Family Medicine

## 2020-10-23 ENCOUNTER — Ambulatory Visit (INDEPENDENT_AMBULATORY_CARE_PROVIDER_SITE_OTHER): Payer: No Typology Code available for payment source

## 2020-10-23 DIAGNOSIS — M25531 Pain in right wrist: Secondary | ICD-10-CM

## 2020-10-23 DIAGNOSIS — S60211A Contusion of right wrist, initial encounter: Secondary | ICD-10-CM | POA: Diagnosis not present

## 2020-10-23 DIAGNOSIS — S6741XA Crushing injury of right wrist and hand, initial encounter: Secondary | ICD-10-CM | POA: Diagnosis not present

## 2020-10-23 MED ORDER — HYDROCODONE-ACETAMINOPHEN 5-325 MG PO TABS
1.0000 | ORAL_TABLET | Freq: Once | ORAL | Status: AC
  Administered 2020-10-23: 1 via ORAL

## 2020-10-23 MED ORDER — HYDROCODONE-ACETAMINOPHEN 5-325 MG PO TABS
ORAL_TABLET | ORAL | Status: AC
  Filled 2020-10-23: qty 1

## 2020-10-23 MED ORDER — HYDROCODONE-ACETAMINOPHEN 5-325 MG PO TABS: 1.0000 | ORAL_TABLET | Freq: Four times a day (QID) | ORAL | 0 refills | Status: DC | PRN

## 2020-10-23 NOTE — ED Triage Notes (Signed)
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

## 2020-10-23 NOTE — ED Provider Notes (Addendum)
[place]   [id number] 10/23/20 Arrival Time: 1497  ASSESSMENT & PLAN:  1. Right wrist pain   2. Contusion of right wrist, initial encounter    I have personally viewed the imaging studies ordered this visit. No fracture appreciated.  Brace and sling applied for comfort. Work note provided.   Follow-up Information     Call  [facility].   Why: [address], [place] [address]  [phone]               Meds ordered this encounter  Medications   HYDROcodone-acetaminophen (NORCO/VICODIN) 5-325 MG per tablet 1 tablet   HYDROcodone-acetaminophen (NORCO/VICODIN) 5-325 MG tablet    Sig: Take 1 tablet by mouth every 6 (six) hours as needed for moderate pain or severe pain.    Dispense:  8 tablet    Refill:  0     Discharge Instructions      Be aware, you have been prescribed pain medications that may cause drowsiness. While taking this medication, do not take any other medications containing acetaminophen (Tylenol). Do not combine with alcohol or other illicit drugs. Please do not drive, operate heavy machinery, or take part in activities that require making important decisions while on this medication as your judgement may be clouded.       [place] Controlled Substances Registry consulted for this patient. I feel the risk/benefit ratio today is favorable for proceeding with this prescription for a controlled substance. Medication sedation precautions given.  Reviewed expectations re: course of current medical issues. Questions answered. Outlined signs and symptoms indicating need for more acute intervention. Patient verbalized understanding. After Visit Summary given.  SUBJECTIVE: History from: patient. Samantha Golden is a 55 y.o. 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 Symptoms have progressed to a point and plateaued since beginning. Aggravating  factors:  any movement . Alleviating factors:  none . Associated symptoms: none reported. Extremity sensation changes or weakness: none. No tx PTA. History of similar: no.  Past Surgical History:  Procedure Laterality Date   ADENOIDECTOMY     CERVIX LESION DESTRUCTION     COLPOSCOPY     INTRAUTERINE DEVICE (IUD) INSERTION  09/04/2014   Mirena   SKIN CANCER REMOVED  09/2019     OBJECTIVE:  Vitals:   10/23/20 1804  BP: (!) 137/105  Pulse: 81  Resp: 20  SpO2: 97%    General appearance: alert; no distress; appears to be in pain HEENT: [place]; AT Neck: supple with FROM Resp: unlabored respirations Extremities: RUE: warm with well perfused appearance; diffuse marked tenderness over right wrist, dorsal and volar; without gross deformities; swelling: minimal; bruising: none; wrist ROM: limited by reported pain CV: brisk extremity capillary refill of RUE; 2+ radial pulse of RUE. Skin: warm and dry; no visible rashes Neurologic: gait normal; normal sensation and strength of RUE Psychological: alert and cooperative; normal mood and affect  Imaging: DG Wrist Complete Right  Result Date: 10/23/2020 CLINICAL DATA:  Crush injury to the right wrist, initial encounter EXAM: RIGHT WRIST - COMPLETE 3+ VIEW COMPARISON:  None. FINDINGS: There is no evidence of fracture or dislocation. There is no evidence of arthropathy or other focal bone abnormality. Soft tissues are unremarkable. IMPRESSION: No acute abnormality noted. Electronically Signed   By: Inez Catalina M.D.   On: 10/23/2020 18:37      Allergies  Allergen Reactions   Latex Shortness Of Breath, Itching  and Rash    Wheezing when she removes latex gloves after working, does not use anything latex !    Augmentin [Amoxicillin-Pot Clavulanate]    Other     "CLEANING MATERIALS" (bleach, ammonia)    Past Medical History:  Diagnosis Date   Abnormal cervical Papanicolaou smear    ? LASER TREATMENT    Allergy    Anxiety    Asthma     BRCA negative 08/09   Cancer ([place])    skin ca   Cervical dysplasia    Chronic kidney disease    Hashimoto's disease    Hypothyroidism    Pneumonia    Social History   Socioeconomic History   Marital status: Divorced    Spouse name: Not on file   Number of children: 3   Years of education: Not on file   Highest education level: Not on file  Occupational History   Not on file  Tobacco Use   Smoking status: Former    Packs/day: 0.25    Years: 20.00    Pack years: 5.00    Types: Cigarettes    Quit date: 07/10/2011    Years since quitting: 9.2   Smokeless tobacco: Never  Vaping Use   Vaping Use: Never used  Substance and Sexual Activity   Alcohol use: Yes    Alcohol/week: 28.0 - 42.0 standard drinks    Types: 28 - 42 Shots of liquor per week    Comment: 5 to 6 drinks per day   Drug use: No   Sexual activity: Yes    Birth control/protection: I.U.D.    Comment: MIRENA 09/04/2014- first sexual encounter 20 , fewer than 5 partners  Other Topics Concern   Not on file  Social History Narrative   Not on file   Social Determinants of Health   Financial Resource Strain: Not on file  Food Insecurity: Not on file  Transportation Needs: Not on file  Physical Activity: Not on file  Stress: Not on file  Social Connections: Not on file   Family History  Problem Relation Age of Onset   Breast cancer Mother        Age 69   Breast cancer Maternal Grandmother        Age 22's   Diabetes Maternal Grandfather    Healthy Father    Alcohol abuse Brother    Cancer Paternal Aunt        Cervical   Diabetes Paternal Uncle    Breast cancer Paternal 70        Age 32's   Colon cancer Neg Hx    Esophageal cancer Neg Hx    Stomach cancer Neg Hx    Pancreatic cancer Neg Hx    Liver disease Neg Hx    Inflammatory bowel disease Neg Hx    Past Surgical History:  Procedure Laterality Date   ADENOIDECTOMY     CERVIX LESION DESTRUCTION     COLPOSCOPY     INTRAUTERINE DEVICE (IUD)  INSERTION  09/04/2014   Mirena   SKIN CANCER REMOVED  09/2019       Vanessa Kick, MD 10/23/20 [facility]    Vanessa Kick, MD 10/23/20 [phone]

## 2020-10-23 NOTE — Discharge Instructions (Addendum)

## 2020-10-30 ENCOUNTER — Encounter: Payer: Self-pay | Admitting: Obstetrics & Gynecology

## 2020-12-04 ENCOUNTER — Other Ambulatory Visit: Payer: Self-pay

## 2020-12-04 ENCOUNTER — Other Ambulatory Visit (HOSPITAL_COMMUNITY)
Admission: RE | Admit: 2020-12-04 | Discharge: 2020-12-04 | Disposition: A | Discharge status: Home / Self Care | Payer: BC Managed Care – PPO | Source: Ambulatory Visit | Attending: Obstetrics & Gynecology | Admitting: Obstetrics & Gynecology

## 2020-12-04 ENCOUNTER — Encounter: Payer: Self-pay | Admitting: Obstetrics & Gynecology

## 2020-12-04 ENCOUNTER — Ambulatory Visit (INDEPENDENT_AMBULATORY_CARE_PROVIDER_SITE_OTHER): Payer: BC Managed Care – PPO | Admitting: Obstetrics & Gynecology

## 2020-12-04 VITALS — BP 108/70 | HR 70 | Resp 16 | Ht 65.5 in | Wt 145.0 lb

## 2020-12-04 DIAGNOSIS — Z01419 Encounter for gynecological examination (general) (routine) without abnormal findings: Secondary | ICD-10-CM | POA: Diagnosis not present

## 2020-12-04 DIAGNOSIS — N6323 Unspecified lump in the left breast, lower outer quadrant: Secondary | ICD-10-CM

## 2020-12-04 DIAGNOSIS — Z780 Asymptomatic menopausal state: Secondary | ICD-10-CM | POA: Diagnosis not present

## 2020-12-04 NOTE — Progress Notes (Signed)
Samantha Golden [birth year] [id number]   History:    55 y.o. G3P3L3  RP:  Established patient presenting for annual gyn exam   HPI: Postmenopausal, well on no HRT.  No PMB.  No pelvic pain.  Pap Neg 11/2019.  H/O ASCUS with Neg HPV HR.  Rt breast normal, Lt still feels a lump in the outer lower quadrant.  Mother with breast Ca in her 72's.  Mammo benign in 10/2020, but patient not reassured.  Colono 2019.  BMI 28.76.  Fitness and healthy nutrition.  Health labs with Dr Carlota Raspberry.  Past medical history,surgical history, family history and social history were all reviewed and documented in the EPIC chart.  Gynecologic History No LMP recorded (lmp unknown). Patient is postmenopausal.  Obstetric History OB History  Gravida Para Term Preterm AB Living  3 3 3     3   SAB IAB Ectopic Multiple Live Births               # Outcome Date GA Lbr Len/2nd Weight Sex Delivery Anes PTL Lv  3 Term           2 Term           1 Term              ROS: A ROS was performed and pertinent positives and negatives are included in the history.  GENERAL: No fevers or chills. HEENT: No change in vision, no earache, sore throat or sinus congestion. NECK: No pain or stiffness. CARDIOVASCULAR: No chest pain or pressure. No palpitations. PULMONARY: No shortness of breath, cough or wheeze. GASTROINTESTINAL: No abdominal pain, nausea, vomiting or diarrhea, melena or bright red blood per rectum. GENITOURINARY: No urinary frequency, urgency, hesitancy or dysuria. MUSCULOSKELETAL: No joint or muscle pain, no back pain, no recent trauma. DERMATOLOGIC: No rash, no itching, no lesions. ENDOCRINE: No polyuria, polydipsia, no heat or cold intolerance. No recent change in weight. HEMATOLOGICAL: No anemia or easy bruising or bleeding. NEUROLOGIC: No headache, seizures, numbness, tingling or weakness. PSYCHIATRIC: No depression, no loss of interest in normal activity or change in sleep pattern.     Exam:   BP 108/70   Pulse 70    Resp 16   Ht 5' 5.5" (1.664 m)   Wt 145 lb (65.8 kg)   LMP  (LMP Unknown)   BMI 23.76 kg/m   Body mass index is 23.76 kg/m.  General appearance : Well developed well nourished female. No acute distress HEENT: Eyes: no retinal hemorrhage or exudates,  Neck supple, trachea midline, no carotid bruits, no thyroidmegaly Lungs: Clear to auscultation, no rhonchi or wheezes, or rib retractions  Heart: Regular rate and rhythm, no murmurs or gallops Breast:Examined in sitting and supine position were symmetrical in appearance.  Rt breast no palpable masses or tenderness,  no skin retraction, no nipple inversion, no nipple discharge, no skin discoloration.  Lt breast with a mass at 4 O'Clock, close to the nipple about 2 x 4 cm.  No tenderness,  no skin retraction, no nipple inversion, no nipple discharge, no skin discoloration.  No axillary or supraclavicular lymphadenopathy bilaterally. Abdomen: no palpable masses or tenderness, no rebound or guardingtenderness,  no skin retraction, no nipple inversion, no nipple discharge, no skin discoloration. Extremities: no edema or skin discoloration or tenderness  Pelvic: Vulva: Normal             Vagina: No gross lesions or discharge  Cervix: No gross lesions or discharge.  Pap  reflex done.  Uterus  AV, normal size, shape and consistency, non-tender and mobile  Adnexa  Without masses or tenderness  Anus: Normal   Assessment/Plan:  55 y.o. female for annual exam   1. Encounter for routine gynecological examination with Papanicolaou smear of cervix Normal gynecologic exam.  Pap reflex done.  Breast exam with a left breast mass.  Colono 2019.  Health Labs with Dr Carlota Raspberry.   - Cytology - PAP( [facility])  2. Postmenopause Well on no HRT.  No PMB.  Vit D supplement, Ca++ 1.5 g/d total and regular weight bearing physical activities.  3. Breast lump on left side at 4 o'clock position  Left breast mass at 4 O'clock close to the nipple about 2 x 4 cm.   Mother with Breast Cancer in her 57's.  Left Dx mammo/US 10/2020 benign.  Per breast exam today, needs further investigation. Schedule breast MRI.  Princess Bruins MD, 4:26 PM 12/04/2020

## 2020-12-05 ENCOUNTER — Encounter: Payer: Self-pay | Admitting: Obstetrics & Gynecology

## 2020-12-08 ENCOUNTER — Telehealth: Payer: Self-pay

## 2020-12-08 NOTE — Telephone Encounter (Signed)
Samantha Bruins, MD  [facility] Triage   Left breast mass at 4 O'clock close to the nipple.  Mother with Breast Cancer in her 30's.  Left Dx mammo/US 10/2020 benign.  Per breast exam today, needs further investigation.

## 2020-12-08 NOTE — Telephone Encounter (Signed)
Patient sent My Chart message today about having a question about MRI. I will hold off scheduling until that is resolved.

## 2020-12-09 ENCOUNTER — Other Ambulatory Visit: Payer: Self-pay

## 2020-12-09 DIAGNOSIS — N6324 Unspecified lump in the left breast, lower inner quadrant: Secondary | ICD-10-CM

## 2020-12-09 DIAGNOSIS — N6320 Unspecified lump in the left breast, unspecified quadrant: Secondary | ICD-10-CM

## 2020-12-09 NOTE — Telephone Encounter (Signed)
I called patient and per DPR access note on file I left message that MRI order placed and she can call to schedule appt as they have screening questions to review with her before scheduling. Ph # provided. I asked patient to call if any questions.

## 2020-12-11 LAB — CYTOLOGY - PAP
Comment: NEGATIVE
Diagnosis: UNDETERMINED — AB
High risk HPV: NEGATIVE

## 2020-12-18 NOTE — Telephone Encounter (Signed)
Authorization obtained. Encounter closed.

## 2020-12-18 NOTE — Telephone Encounter (Signed)
MRI scheduled 12/23/20 at [facility].  Routed to [facility] high priority.

## 2020-12-22 ENCOUNTER — Encounter: Payer: Self-pay | Admitting: *Deleted

## 2020-12-23 ENCOUNTER — Other Ambulatory Visit: Payer: Self-pay

## 2020-12-23 ENCOUNTER — Ambulatory Visit
Admission: RE | Admit: 2020-12-23 | Discharge: 2020-12-23 | Disposition: A | Discharge status: Home / Self Care | Payer: BC Managed Care – PPO | Source: Ambulatory Visit | Attending: Obstetrics & Gynecology | Admitting: Obstetrics & Gynecology

## 2020-12-23 DIAGNOSIS — N6324 Unspecified lump in the left breast, lower inner quadrant: Secondary | ICD-10-CM

## 2020-12-23 MED ORDER — GADOBUTROL 1 MMOL/ML IV SOLN
7.0000 mL | Freq: Once | INTRAVENOUS | Status: AC | PRN
  Administered 2020-12-23: 7 mL via INTRAVENOUS

## 2020-12-24 ENCOUNTER — Telehealth: Payer: Self-pay | Admitting: *Deleted

## 2020-12-24 NOTE — Telephone Encounter (Signed)
Patient called and left message in triage about Mri results done yesterday. I left message for patient to call back with questions.

## 2020-12-24 NOTE — Telephone Encounter (Signed)
Patient called regarding breast Mri done on 12/23/20. Patient asked if you would review MRI of breast,( in epic)  she wanted to know if you think she should just go ahead and ask for breast biopsy due to her family history? Patient said she doesn't want to have the breast biopsy but if she needs it she will. She reports having multiple breast imaging (ultrasounds) done. States this lump in left breast has been there for some time and if it cancer she wants to know. Please advise

## 2020-12-25 ENCOUNTER — Other Ambulatory Visit: Payer: Self-pay | Admitting: Obstetrics & Gynecology

## 2020-12-25 DIAGNOSIS — R9389 Abnormal findings on diagnostic imaging of other specified body structures: Secondary | ICD-10-CM

## 2020-12-29 NOTE — Telephone Encounter (Signed)
Patient informed with the below,she is scheduled for breast imaging on 12/30/20.

## 2020-12-30 ENCOUNTER — Ambulatory Visit
Admission: RE | Admit: 2020-12-30 | Discharge: 2020-12-30 | Disposition: A | Discharge status: Home / Self Care | Payer: BC Managed Care – PPO | Source: Ambulatory Visit | Attending: Obstetrics & Gynecology | Admitting: Obstetrics & Gynecology

## 2020-12-30 ENCOUNTER — Other Ambulatory Visit: Payer: Self-pay

## 2020-12-30 DIAGNOSIS — R9389 Abnormal findings on diagnostic imaging of other specified body structures: Secondary | ICD-10-CM

## 2021-01-02 ENCOUNTER — Other Ambulatory Visit: Payer: Self-pay

## 2021-01-02 ENCOUNTER — Ambulatory Visit: Payer: BC Managed Care – PPO | Admitting: Internal Medicine

## 2021-01-02 ENCOUNTER — Encounter: Payer: Self-pay | Admitting: Internal Medicine

## 2021-01-02 VITALS — BP 100/68 | HR 67 | Ht 65.5 in | Wt 148.0 lb

## 2021-01-02 DIAGNOSIS — E063 Autoimmune thyroiditis: Secondary | ICD-10-CM | POA: Diagnosis not present

## 2021-01-02 DIAGNOSIS — E038 Other specified hypothyroidism: Secondary | ICD-10-CM | POA: Diagnosis not present

## 2021-01-02 LAB — T4, FREE: Free T4: 0.51 ng/dL — ABNORMAL LOW (ref 0.60–1.60)

## 2021-01-02 LAB — T3, FREE: T3, Free: 3.5 pg/mL (ref 2.3–4.2)

## 2021-01-02 LAB — TSH: TSH: 2.26 u[IU]/mL (ref 0.35–5.50)

## 2021-01-02 NOTE — Progress Notes (Signed)
Patient ID: Samantha Golden, female   DOB: June 27, 1965, 55 y.o.   MRN: [id number]  This visit occurred during the SARS-CoV-2 public health emergency.  Safety protocols were in place, including screening questions prior to the visit, additional usage of staff PPE, and extensive cleaning of exam room while observing appropriate contact time as indicated for disinfecting solutions.   HPI  Samantha Golden is a 55 y.o.-year-old female, initially referred by Dr Phineas Real, returning for f/u for uncontrolled Hashimoto's hypothyroidism. Last visit 6 months ago.  Interim history: At this visit, she feels that her brain fog and fatigue have improved. She is still not sleeping well, though.  She also has arthritis and tremors in her hands especially in the morning.  Her brother also has these. She started to drink lemon water with apple cider vinegar in the morning.  She lost 5 pounds net since last visit.  Reviewed history: Pt. has been dx with hypothyroidism in ~1990 (college).   Previously on Synthroid DAW 225 mcg >> 300 mcg >> ... >> Currently on Armour  In 09/2017, she was on Armour 90 mg alternating with 120 mg every other day >> we had to decrease the dose to 90 mg daily.  Subsequent TSH was still slightly abnormal but improved.  Due to her symptoms, we continued the same dose of Armour at that time.  In 04/2019, we decreased the dose of Armour to 75 mg daily.  She takes Armour 75 mg daily: - in am - fasting - at least  45-60 min from coffee and b'fast - no Ca, Fe, PPIs - not on Biotin She did not start selenium at last visit.  Reviewed her TFTs: Lab Results  Component Value Date   TSH 2.42 07/03/2020   TSH 2.61 12/27/2019   TSH 2.07 06/01/2019   TSH 0.14 (L) 04/19/2019   TSH 0.266 (L) 03/26/2019   TSH 0.79 09/18/2018   TSH 0.50 04/07/2018   TSH 0.26 (L) 12/02/2017   TSH 0.05 (L) 10/03/2017   TSH 0.03 (L) 04/07/2017   FREET4 0.45 (L) 07/03/2020   FREET4 0.65 12/27/2019   FREET4 0.51  (L) 06/01/2019   FREET4 0.70 04/19/2019   FREET4 0.98 03/26/2019   FREET4 0.59 (L) 04/07/2018   FREET4 0.62 12/02/2017   FREET4 0.65 10/03/2017   FREET4 0.92 04/07/2017   FREET4 1.04 09/15/2016  TSH 4.26 in 2012. She has been out of the LT4 for ~ 1 mo before the test in 10/2013.  She had elevated TPO antibodies, confirming Hashimoto's thyroiditis:  Component     Latest Ref Rng & Units 12/27/2019  Thyroperoxidase Ab SerPl-aCnc     <9 IU/mL 272 (H)  Thyroglobulin Ab     < or = 1 IU/mL 5 (H)   Component     Latest Ref Rng & Units 10/23/2013  Thyroperoxidase Ab SerPl-aCnc     <35.0 IU/mL 265.0 (H)  Thyroglobulin Ab     <40.0 IU/mL 38.2   She had palpitations when she took Adderall with the LT4 300 mcg.  Pt denies: - feeling nodules in neck - hoarseness - dysphagia - choking - SOB with lying down  She also has a history of elevated LFTs and ascites. She had bilateral nephrolithiasis on CT from 01/2020.  ROS: + see HPI  I reviewed pt's medications, allergies, PMH, social hx, family hx, and changes were documented in the history of present illness. Otherwise, unchanged from my initial visit note.  Past Medical History:  Diagnosis Date  Abnormal cervical Papanicolaou smear    ? LASER TREATMENT    Allergy    Anxiety    Asthma    BRCA negative 08/09   Cancer ([place])    skin ca   Cervical dysplasia    Chronic kidney disease    Hashimoto's disease    Hypothyroidism    Pneumonia    Past Surgical History:  Procedure Laterality Date   ADENOIDECTOMY     CERVIX LESION DESTRUCTION     COLPOSCOPY     INTRAUTERINE DEVICE (IUD) INSERTION  09/04/2014   Mirena   SKIN CANCER REMOVED  09/2019   History   Social History   Marital Status: Divorced    Spouse Name: N/A    Number of Children: 3   Occupational History   teacher   Social History Main Topics   Smoking status: Former Smoker   Smokeless tobacco: Never Used   Alcohol Use: 1.0 oz/week    2 drink(s) per  week   Drug Use: No   Sexual Activity: Yes    Birth Web designer: IUD     Comment: ParaGard 08/2012   Current Outpatient Medications on File Prior to Visit  Medication Sig Dispense Refill   albuterol (VENTOLIN HFA) 108 (90 Base) MCG/ACT inhaler Inhale 1-2 puffs into the lungs every 4 (four) hours as needed for wheezing or shortness of breath. 18 g 0   ARMOUR THYROID 15 MG tablet Take 1 tablet (15 mg total) by mouth daily. 90 tablet 3   ARMOUR THYROID 60 MG tablet TAKE 1 TABLET ONCE DAILY BEFORE BREAKFAST. 90 tablet 3   fluticasone (FLONASE) 50 MCG/ACT nasal spray Place 1-2 sprays into both nostrils daily. 16 g 11   Fluticasone-Salmeterol (ADVAIR DISKUS) 250-50 MCG/DOSE AEPB Inhale 1 puff into the lungs in the morning and at bedtime. 60 each 6   Current Facility-Administered Medications on File Prior to Visit  Medication Dose Route Frequency Provider Last Rate Last Admin   levonorgestrel (MIRENA) 20 MCG/24HR IUD   Intrauterine Once Fontaine, Belinda Block, MD       Allergies  Allergen Reactions   Latex Shortness Of Breath, Itching and Rash    Wheezing when she removes latex gloves after working, does not use anything latex !    Other     "CLEANING MATERIALS" (bleach, ammonia)   Amoxicillin-Pot Clavulanate Rash   Family History  Problem Relation Age of Onset   Breast cancer Mother        Age 28   Breast cancer Maternal Grandmother        Age 43's   Diabetes Maternal Grandfather    Healthy Father    Alcohol abuse Brother    Cancer Paternal Aunt        Cervical   Diabetes Paternal Uncle    Breast cancer Paternal Aunt        Age 31's   Colon cancer Neg Hx    Esophageal cancer Neg Hx    Stomach cancer Neg Hx    Pancreatic cancer Neg Hx    Liver disease Neg Hx    Inflammatory bowel disease Neg Hx    PE: BP 100/68 (BP Location: Right Arm, Patient Position: Sitting, Cuff Size: Normal)   Pulse 67   Ht 5' 5.5" (1.664 m)   Wt 148 lb (67.1 kg)   LMP  (LMP Unknown)   SpO2 97%    BMI 24.25 kg/m  Body mass index is 24.25 kg/m.  Wt Readings from Last 3 Encounters:  01/02/21 148 lb (67.1 kg)  12/04/20 145 lb (65.8 kg)  07/03/20 153 lb 9.6 oz (69.7 kg)   Constitutional: normal weight, in NAD Eyes: PERRLA, EOMI, no exophthalmos ENT: moist mucous membranes, no thyromegaly, no cervical lymphadenopathy Cardiovascular: RRR, No MRG Respiratory: CTA B Gastrointestinal: abdomen soft, NT, ND, BS+ Musculoskeletal: no deformities, strength intact in all 4 Skin: moist, warm, no rashes Neurological: + Very faint tremor with outstretched hands, DTR normal in all 4  ASSESSMENT: 1. Hashimoto's Hypothyroidism - did not feel good on Synthroid, Levothyroxine  PLAN:  1. Patient with longstanding, uncontrolled, Hashimoto's thyroiditis, on Armour Thyroid.  She tried generic levothyroxine in the past and also brand-name Synthroid but did not feel good on these.  She felt better on Armour, but she continues to have nonspecific symptoms, possibly also related to menopause.  -She is usually resistant to decrease the dose of Armour even after discussion about possible side effects to include arrhythmia, hypercoagulability with increased risk of strokes and heart attacks, osteoporosis. - latest thyroid labs reviewed with pt. >> normal: Lab Results  Component Value Date   TSH 2.42 07/03/2020  - she continues on Armour 75 mg daily - pt feels good on this dose, with her chronic fatigue and brain fog improving at this visit.  At last visit she also describes a variety of other symptoms including inability to lose weight, diarrhea alternating with constipation, dry skin, memory problems, and mental fog.  We discussed that in the setting of normal thyroid test, it is unusual for such symptoms to be caused by the thyroid disorder.  However, I did suggest selenium again (she has used this in the past) to improve her thyroid antibody titer.  She did not start selenium afterwards that she started to  feel better.  I do not feel we absolutely need to start it for now, but I would leave it at her latitude. - we discussed about taking the thyroid hormone every day, with water, >30 minutes before breakfast, separated by >4 hours from acid reflux medications, calcium, iron, multivitamins. Pt. is taking it correctly. - will check thyroid tests today: TSH, free T3 and fT4  - If labs are abnormal, she will need to return for repeat TFTs in 1.5 months -I will see her back in 1 year  Component     Latest Ref Rng & Units 01/02/2021  TSH     0.35 - 5.50 uIU/mL 2.26  T4,Free(Direct)     0.60 - 1.60 ng/dL 0.51 (L)  Triiodothyronine,Free,Serum     2.3 - 4.2 pg/mL 3.5  TFTs at goal.  Philemon Kingdom, MD PhD [facility] Endocr[facility]

## 2021-01-02 NOTE — Patient Instructions (Addendum)
Please stop at the lab.  Please continue Armour 75 daily.  Take the thyroid hormone every day, with water, at least 30 minutes before breakfast, separated by at least 4 hours from: - acid reflux medications - calcium - iron - multivitamins  Please return in 1 year.

## 2021-01-16 ENCOUNTER — Telehealth: Payer: Self-pay | Admitting: Genetic Counselor

## 2021-01-16 NOTE — Telephone Encounter (Signed)
Scheduled appt per 11/11 referral.. Pt aware of appt date and time.

## 2021-01-19 NOTE — Progress Notes (Deleted)
REFERRING PROVIDER: Erroll Luna, MD [address] [place],  [place] [address]  PRIMARY PROVIDER:  Pcp, No  PRIMARY REASON FOR VISIT:  No diagnosis found.   HISTORY OF PRESENT ILLNESS:   Samantha Golden, a 55 y.o. female, was seen for a [facility] cancer genetics consultation at the request of Dr. Brantley Stage due to a {Personal/family:20331} history of {cancer/polyps}.  Samantha Golden presents to clinic today to discuss the possibility of a hereditary predisposition to cancer, to discuss genetic testing, and to further clarify her future cancer risks, as well as potential cancer risks for family members.   In ***, at the age of ***, Ms. Flaim was diagnosed with {CA PATHOLOGY:63853} of the {right left (wildcard):15202} {CA FYBOF:75102}. The treatment plan ***.    *** Samantha Golden is a 55 y.o. female with no personal history of cancer.    CANCER HISTORY:  Oncology History   No history exists.     RISK FACTORS:  Menarche was at age ***.  First live birth at age ***.  OCP use for approximately {Numbers 1-12 multi-select:20307} years.  Ovaries intact: {Yes/No-Ex:120004}.  Uterus intact: {Yes/No-Ex:120004}.  Menopausal status: {Menopause:31378}.  HRT use: {Numbers 1-12 multi-select:20307} years. Colonoscopy: {Yes/No-Ex:120004}; {normal/abnormal/not examined:14677}. Mammogram within the last year: {Yes/No-Ex:120004}. Number of breast biopsies: {Numbers 1-12 multi-select:20307}. Up to date with pelvic exams: {Yes/No-Ex:120004}. Any excessive radiation exposure in the past: {Yes/No-Ex:120004}  Past Medical History:  Diagnosis Date   Abnormal cervical Papanicolaou smear    ? LASER TREATMENT    Allergy    Anxiety    Asthma    BRCA negative 08/09   Cancer ([place])    skin ca   Cervical dysplasia    Chronic kidney disease    Hashimoto's disease    Hypothyroidism    Pneumonia     Past Surgical History:  Procedure Laterality Date   ADENOIDECTOMY     CERVIX LESION DESTRUCTION      COLPOSCOPY     INTRAUTERINE DEVICE (IUD) INSERTION  09/04/2014   Mirena   SKIN CANCER REMOVED  09/2019    Social History   Socioeconomic History   Marital status: Single    Spouse name: Not on file   Number of children: 3   Years of education: Not on file   Highest education level: Not on file  Occupational History   Not on file  Tobacco Use   Smoking status: Former    Packs/day: 0.25    Years: 20.00    Pack years: 5.00    Types: Cigarettes    Quit date: 07/10/2011    Years since quitting: 9.5   Smokeless tobacco: Never  Vaping Use   Vaping Use: Never used  Substance and Sexual Activity   Alcohol use: Not Currently    Comment: 2-3 drinks per day   Drug use: No   Sexual activity: Yes    Birth control/protection: Post-menopausal    Comment: MIRENA 09/04/2014- first sexual encounter 20 , fewer than 5 partners  Other Topics Concern   Not on file  Social History Narrative   Not on file   Social Determinants of Health   Financial Resource Strain: Not on file  Food Insecurity: Not on file  Transportation Needs: Not on file  Physical Activity: Not on file  Stress: Not on file  Social Connections: Not on file     FAMILY HISTORY:  We obtained a detailed, 4-generation family history.  Significant diagnoses are listed below: Family History  Problem Relation  Age of Onset   Breast cancer Mother        Age 50   Breast cancer Maternal Grandmother        Age 36's   Diabetes Maternal Grandfather    Healthy Father    Alcohol abuse Brother    Cancer Paternal Aunt        Cervical   Diabetes Paternal Uncle    Breast cancer Paternal 46        Age 57's   Colon cancer Neg Hx    Esophageal cancer Neg Hx    Stomach cancer Neg Hx    Pancreatic cancer Neg Hx    Liver disease Neg Hx    Inflammatory bowel disease Neg Hx     Samantha Golden is {aware/unaware} of previous family history of genetic testing for hereditary cancer risks. Patient's maternal ancestors are of ***  descent, and paternal ancestors are of *** descent. There {IS NO:12509} reported Ashkenazi Jewish ancestry. There {IS NO:12509} known consanguinity.  GENETIC COUNSELING ASSESSMENT: Samantha Golden is a 55 y.o. female with a {Personal/family:20331} history of {cancer/polyps} which is somewhat suggestive of a hereditary predisposition to cancer given ***. We, therefore, discussed and recommended the following at today's visit.   DISCUSSION: We discussed that 5 - 10% of cancer is hereditary, with most cases of hereditary breast cancer associated with BRCA1/2.  There are other genes that can be associated with hereditary breast cancer syndromes.  We discussed that testing is beneficial for several reasons, including knowing about other cancer risks, identifying potential screening and risk-reduction options that may be appropriate, and to understanding if other family members could be at risk for cancer and allowing them to undergo genetic testing.  We reviewed the characteristics, features and inheritance patterns of hereditary cancer syndromes. We also discussed genetic testing, including the appropriate family members to test, the process of testing, insurance coverage and turn-around-time for results. We discussed the implications of a negative, positive, carrier and/or variant of uncertain significant result. We discussed that negative results would be uninformative given that Samantha Golden does not have a personal history of cancer.   Samantha Golden was offered a common hereditary cancer panel (47 genes) and an expanded pan-cancer panel (84 genes). Samantha Golden was informed of the benefits and limitations of each panel, including that expanded pan-cancer panels contain several genes that do not have clear management guidelines at this point in time.  We also discussed that as the number of genes included on a panel increases, the chances of variants of uncertain significance increases.  After considering the benefits  and limitations of each gene panel, Samantha Golden elected to have an *** through ***.   Based on [name]'s {Personal/family:20331} history of cancer, she meets medical criteria for genetic testing. Despite that she meets criteria, she may still have an out of pocket cost. We discussed that if her out of pocket cost for testing is over $100, the laboratory will call and confirm whether she wants to proceed with testing.  If the out of pocket cost of testing is less than $100 she will be billed by the genetic testing laboratory.   We discussed that some people do not want to undergo genetic testing due to fear of genetic discrimination.  A federal law called the Genetic Information Non-Discrimination Act (GINA) of 2008 helps protect individuals against genetic discrimination based on their genetic test results.  It impacts both health insurance and employment.  With health insurance, it protects against increased  premiums, being kicked off insurance or being forced to take a test in order to be insured.  For employment it protects against hiring, firing and promoting decisions based on genetic test results.  GINA does not apply to those in the [employer], those who work for companies with less than 15 employees, and new life insurance or long-term disability insurance policies.  Health status due to a cancer diagnosis is not protected under GINA.  PLAN: After considering the risks, benefits, and limitations, Ms. Streb provided informed consent to pursue genetic testing and the blood sample was sent to {Lab} Laboratories for analysis of the {test}. Results should be available within approximately {TAT TIME} weeks' time, at which point they will be disclosed by telephone to Ms. Clere, as will any additional recommendations warranted by these results. Ms. Morace will receive a summary of her genetic counseling visit and a copy of her results once available. This information will also be available in Epic.   ***  Despite our recommendation, Ms. Barga did not wish to pursue genetic testing at today's visit. We understand this decision and remain available to coordinate genetic testing at any time in the future. We, therefore, recommend Ms. Schoenfelder continue to follow the cancer screening guidelines given by her primary healthcare provider.  ***Based on Ms. Antonini's family history, we recommended her ***, who was diagnosed with *** at age ***, have genetic counseling and testing. Ms. Forbess will let us know if we can be of any assistance in coordinating genetic counseling and/or testing for this family member.   Lastly, we encouraged Ms. Laforest to remain in contact with cancer genetics annually so that we can continuously update the family history and inform her of any changes in cancer genetics and testing that may be of benefit for this family.   Ms. Rhue questions were answered to her satisfaction today. Our contact information was provided should additional questions or concerns arise. Thank you for the referral and allowing [place] to share in the care of your patient.   Lucille Passy, MS, [facility] Genetic Counselor [email or web address] (P) [phone]  The patient was seen for a total of *** minutes in face-to-face genetic counseling.  ***The patient brought ***.  ***The patient was seen alone.  Drs. Magrinat, Lindi Adie and/or Burr Medico were available to discuss this case as needed.  _______________________________________________________________________ For Office Staff:  Number of people involved in session: *** Was an Intern/ student involved with case: no

## 2021-01-20 ENCOUNTER — Encounter: Payer: BC Managed Care – PPO | Admitting: Genetic Counselor

## 2021-01-20 ENCOUNTER — Other Ambulatory Visit: Payer: BC Managed Care – PPO

## 2021-01-21 ENCOUNTER — Other Ambulatory Visit: Payer: Self-pay

## 2021-01-21 ENCOUNTER — Encounter: Payer: Self-pay | Admitting: Genetic Counselor

## 2021-01-21 ENCOUNTER — Inpatient Hospital Stay: Payer: BC Managed Care – PPO | Attending: Genetic Counselor | Admitting: Genetic Counselor

## 2021-01-21 ENCOUNTER — Other Ambulatory Visit: Payer: Self-pay | Admitting: Genetic Counselor

## 2021-01-21 ENCOUNTER — Inpatient Hospital Stay: Payer: BC Managed Care – PPO

## 2021-01-21 DIAGNOSIS — Z800 Family history of malignant neoplasm of digestive organs: Secondary | ICD-10-CM | POA: Diagnosis not present

## 2021-01-21 DIAGNOSIS — Z803 Family history of malignant neoplasm of breast: Secondary | ICD-10-CM

## 2021-01-21 LAB — GENETIC SCREENING ORDER

## 2021-01-21 NOTE — Progress Notes (Signed)
REFERRING PROVIDER: Erroll Luna, MD [address] [place],  [place] [address]  PRIMARY PROVIDER:  Pcp, No  PRIMARY REASON FOR VISIT:  1. Family history of colon cancer   2. Family history of breast cancer      HISTORY OF PRESENT ILLNESS:   Samantha Golden, a 55 y.o. female, was seen for a [facility] cancer genetics consultation at the request of Dr. Brantley Stage due to a family history of breast cancer.  Samantha Golden presents to clinic today to discuss the possibility of a hereditary predisposition to cancer, genetic testing, and to further clarify her future cancer risks, as well as potential cancer risks for family members.   Samantha Golden is a 56 y.o. female with no personal history of cancer.  She has had an MRI that is concerning for a spot on one of her breasts.  She is getting follow up for this. In 2009 the patient had BRCA genetic testing that was negative.  CANCER HISTORY:  Oncology History   No history exists.     RISK FACTORS:  Menarche was at age 26.  First live birth at age 89.  OCP use for approximately 5 years.  Ovaries intact: yes.  Hysterectomy: no.  Menopausal status: postmenopausal.  HRT use: 0 years. Colonoscopy: yes; normal. Mammogram within the last year: yes. Number of breast biopsies: 0. Up to date with pelvic exams: yes. Any excessive radiation exposure in the past: no  Past Medical History:  Diagnosis Date   Abnormal cervical Papanicolaou smear    ? LASER TREATMENT    Allergy    Anxiety    Asthma    BRCA negative 10/2007   Cancer ([place])    skin ca   Cervical dysplasia    Chronic kidney disease    Family history of breast cancer    Family history of colon cancer    Hashimoto's disease    Hypothyroidism    Pneumonia     Past Surgical History:  Procedure Laterality Date   ADENOIDECTOMY     CERVIX LESION DESTRUCTION     COLPOSCOPY     INTRAUTERINE DEVICE (IUD) INSERTION  09/04/2014   Mirena   SKIN CANCER REMOVED  09/2019     Social History   Socioeconomic History   Marital status: Single    Spouse name: Not on file   Number of children: 3   Years of education: Not on file   Highest education level: Not on file  Occupational History   Not on file  Tobacco Use   Smoking status: Former    Packs/day: 0.25    Years: 20.00    Pack years: 5.00    Types: Cigarettes    Quit date: 07/10/2011    Years since quitting: 9.5   Smokeless tobacco: Never  Vaping Use   Vaping Use: Never used  Substance and Sexual Activity   Alcohol use: Not Currently    Comment: 2-3 drinks per day   Drug use: No   Sexual activity: Yes    Birth control/protection: Post-menopausal    Comment: MIRENA 09/04/2014- first sexual encounter 20 , fewer than 5 partners  Other Topics Concern   Not on file  Social History Narrative   Not on file   Social Determinants of Health   Financial Resource Strain: Not on file  Food Insecurity: Not on file  Transportation Needs: Not on file  Physical Activity: Not on file  Stress: Not on file  Social Connections: Not on  file     FAMILY HISTORY:  We obtained a detailed, 4-generation family history.  Significant diagnoses are listed below: Family History  Problem Relation Age of Onset   Breast cancer Mother        Age 53   Healthy Father    Alcohol abuse Brother    Cancer Paternal Aunt 48       possible sarcoma   Diabetes Paternal Uncle    Breast cancer Maternal Grandmother        Age 57's   Diabetes Maternal Grandfather    Cancer Paternal Grandmother        vulvar cancer   Pneumonia Paternal Grandfather    Breast cancer Other        MGMs sister   Breast cancer Other        MGMs paternal half sister   Breast cancer Other        PGF's half sister   Cancer Other        PGMs sister with cervical cancer   Colon cancer Neg Hx    Esophageal cancer Neg Hx    Stomach cancer Neg Hx    Pancreatic cancer Neg Hx    Liver disease Neg Hx    Inflammatory bowel disease Neg Hx     The  patient has three sons who are cancer free.  She has two brothers who do not have cancer.  Her parents are both living.  The patient's mother was diagnosed with breast cancer at 44.  She has three paternal half brothers who are cancer free.  Her mother was diagnosed with breast cancer in her 60's and died at 2.  She has a full sister and paternal half sister who had breast cancer and a full brother who had colon cancer.  The patient's father has a brother and sister.  The sister died at 91 from what sounds like a bone cancer.  The paternal grandmother had vulvar cancer and her sister had cervical cancer.  The paternal grandfather's half sister had breast cancer.  Samantha Golden is unaware of previous family history of genetic testing for hereditary cancer risks. Patient's maternal ancestors are of Caucasian descent, and paternal ancestors are of Caucasian descent. There is no reported Ashkenazi Jewish ancestry. There is no known consanguinity.  GENETIC COUNSELING ASSESSMENT: Samantha Golden is a 55 y.o. female with a family history of breast cancer which is somewhat suggestive of a hereditary cancer syndrome and predisposition to cancer given the young age of onset of cancer. We, therefore, discussed and recommended the following at today's visit.   DISCUSSION: We discussed that, in general, most cancer is not inherited in families, but instead is sporadic or familial. Sporadic cancers occur by chance and typically happen at older ages (>50 years) as this type of cancer is caused by genetic changes acquired during an individual's lifetime. Some families have more cancers than would be expected by chance; however, the ages or types of cancer are not consistent with a known genetic mutation or known genetic mutations have been ruled out. This type of familial cancer is thought to be due to a combination of multiple genetic, environmental, hormonal, and lifestyle factors. While this combination of factors likely  increases the risk of cancer, the exact source of this risk is not currently identifiable or testable.  We discussed that 5 - 10% of breast cancer is hereditary, with most cases associated with BRCA mutations.  The patient had negative genetic testing in the past for  BRCA mutations, and therefore the risk is low for mutations to be found.  However, genetic testing has improved over the last 12 years and so there is a small chance something could be found.  There are other genes that can be associated with hereditary breast cancer syndromes.  These include ATM, CHEK2 and PALB2.  We discussed that testing is beneficial for several reasons including knowing how to follow individuals after completing their treatment, identifying whether potential treatment options such as PARP inhibitors would be beneficial, and understand if other family members could be at risk for cancer and allow them to undergo genetic testing.   We reviewed the characteristics, features and inheritance patterns of hereditary cancer syndromes. We also discussed genetic testing, including the appropriate family members to test, the process of testing, insurance coverage and turn-around-time for results. We discussed the implications of a negative, positive, carrier and/or variant of uncertain significant result. We recommended Samantha Golden pursue genetic testing for the CancerNext-Expanded+RNAinsight gene panel.   The CancerNext-Expanded gene panel offered by [facility] and includes sequencing and rearrangement analysis for the following 77 genes: AIP, ALK, APC*, ATM*, AXIN2, BAP1, BARD1, BLM, BMPR1A, BRCA1*, BRCA2*, BRIP1*, CDC73, CDH1*, CDK4, CDKN1B, CDKN2A, CHEK2*, CTNNA1, DICER1, FANCC, FH, FLCN, GALNT12, KIF1B, LZTR1, MAX, MEN1, MET, MLH1*, MSH2*, MSH3, MSH6*, MUTYH*, NBN, NF1*, NF2, NTHL1, PALB2*, PHOX2B, PMS2*, POT1, PRKAR1A, PTCH1, PTEN*, RAD51C*, RAD51D*, RB1, RECQL, RET, SDHA, SDHAF2, SDHB, SDHC, SDHD, SMAD4, SMARCA4, SMARCB1,  SMARCE1, STK11, SUFU, TMEM127, TP53*, TSC1, TSC2, VHL and XRCC2 (sequencing and deletion/duplication); EGFR, EGLN1, HOXB13, KIT, MITF, PDGFRA, POLD1, and POLE (sequencing only); EPCAM and GREM1 (deletion/duplication only). DNA and RNA analyses performed for * genes.   Based on Samantha Golden's family history of cancer, she meets medical criteria for genetic testing. Despite that she meets criteria, she may still have an out of pocket cost. We discussed that if her out of pocket cost for testing is over $100, the laboratory will call and confirm whether she wants to proceed with testing.  If the out of pocket cost of testing is less than $100 she will be billed by the genetic testing laboratory.   PLAN: After considering the risks, benefits, and limitations, Samantha Golden provided informed consent to pursue genetic testing and the blood sample was sent to [facility] for analysis of the CancerNext-Expanded+RNAinsight. Results should be available within approximately 2-3 weeks' time, at which point they will be disclosed by telephone to Samantha Golden, as will any additional recommendations warranted by these results. Samantha Golden will receive a summary of her genetic counseling visit and a copy of her results once available. This information will also be available in Epic.   Based on Samantha Golden's family history, we recommended her mother, who was diagnosed with breast cancer at age 17, have genetic counseling and testing. Samantha Golden will let us know if we can be of any assistance in coordinating genetic counseling and/or testing for this family member.   Lastly, we encouraged Samantha Golden to remain in contact with cancer genetics annually so that we can continuously update the family history and inform her of any changes in cancer genetics and testing that may be of benefit for this family.   Samantha Golden were answered to her satisfaction today. Our contact information was provided should  additional Golden or concerns arise. Thank you for the referral and allowing [place] to share in the care of your patient.   Samantha Golden P. Florene Glen, [place], [facility] Licensed, Insurance risk surveyor  [email or web address] phone: [phone]  The patient was seen for a total of 35 minutes in face-to-face genetic counseling.  The patient was seen alone.  This patient was discussed with Drs. Magrinat, Lindi Adie and/or Burr Medico who agrees with the above.    _______________________________________________________________________ For Office Staff:  Number of people involved in session: 1 Was an Intern/ student involved with case: no

## 2021-02-04 ENCOUNTER — Telehealth: Payer: Self-pay | Admitting: Genetic Counselor

## 2021-02-04 ENCOUNTER — Encounter: Payer: Self-pay | Admitting: Genetic Counselor

## 2021-02-04 DIAGNOSIS — Z1379 Encounter for other screening for genetic and chromosomal anomalies: Secondary | ICD-10-CM | POA: Insufficient documentation

## 2021-02-04 IMAGING — DX LEFT RIBS AND CHEST - 3+ VIEW
3 series · 3 of 3 positions shown · non-contrast
Comparison: January 02, 2016 chest radiograph

CLINICAL DATA: Pain following recent fall

EXAM:
LEFT RIBS AND CHEST - 3+ VIEW

[chest pa]
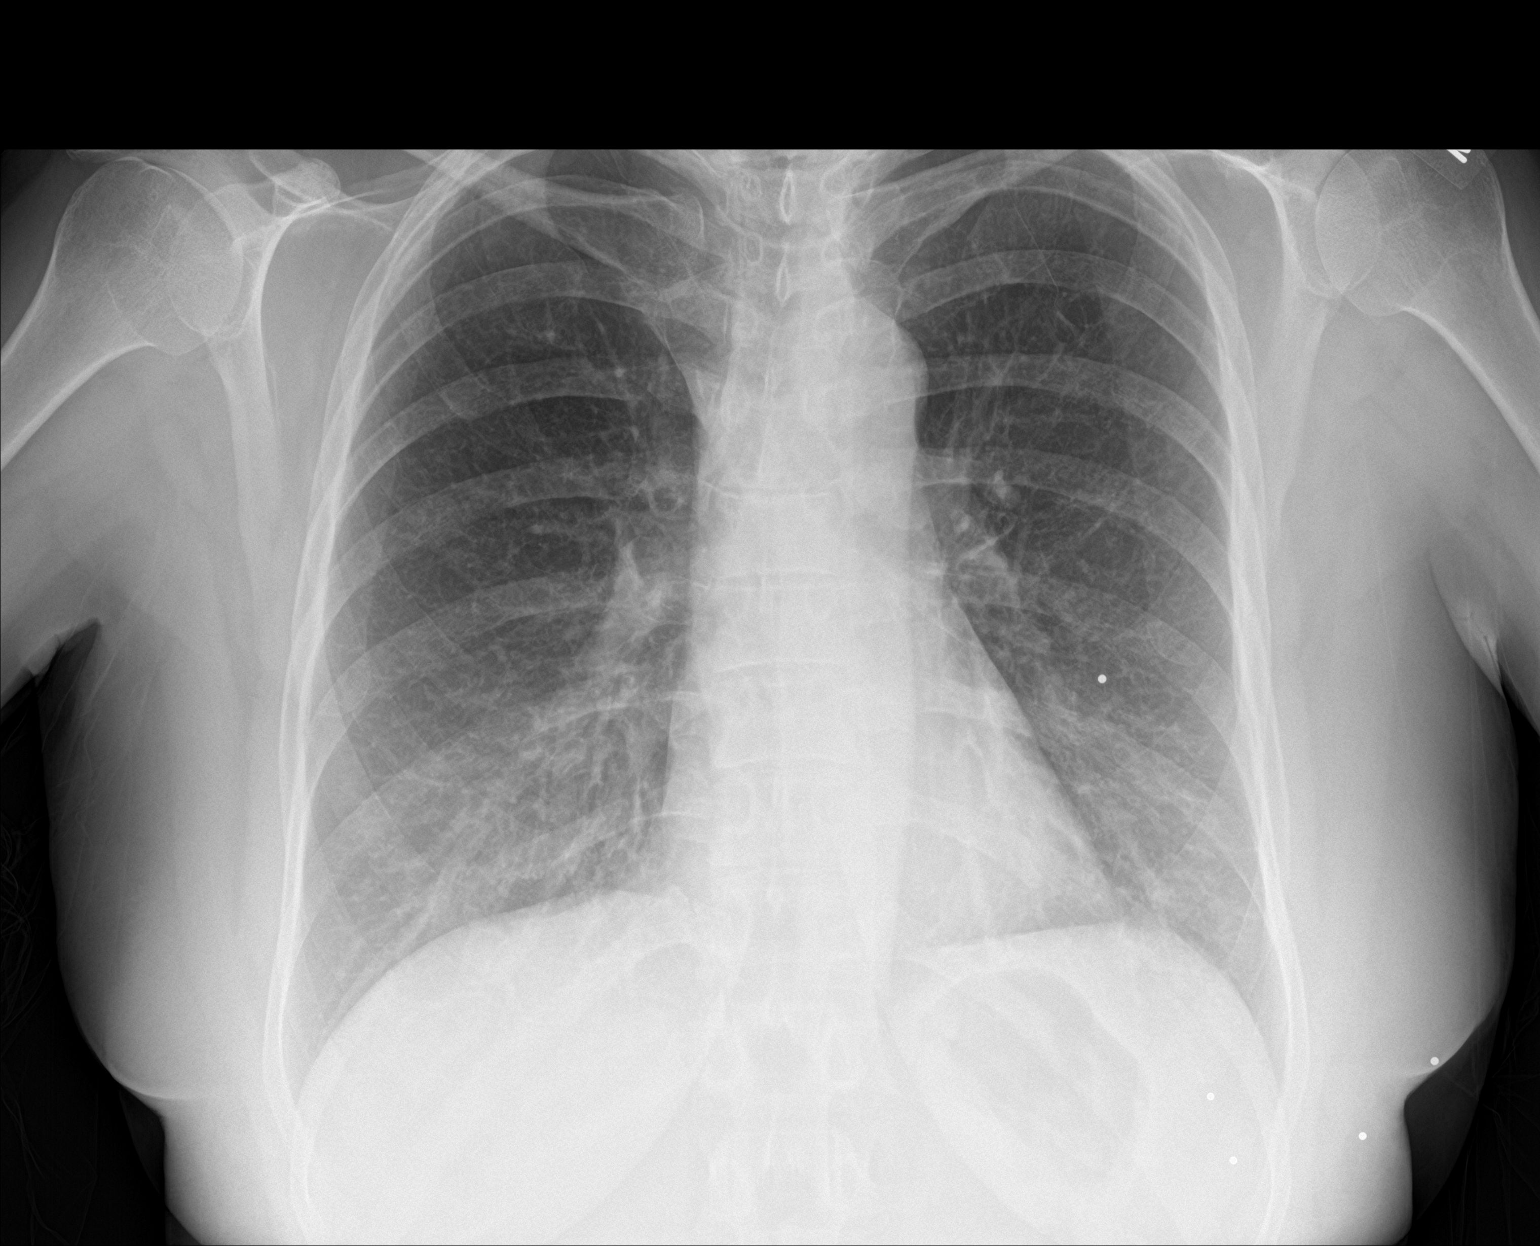

[rib obl]
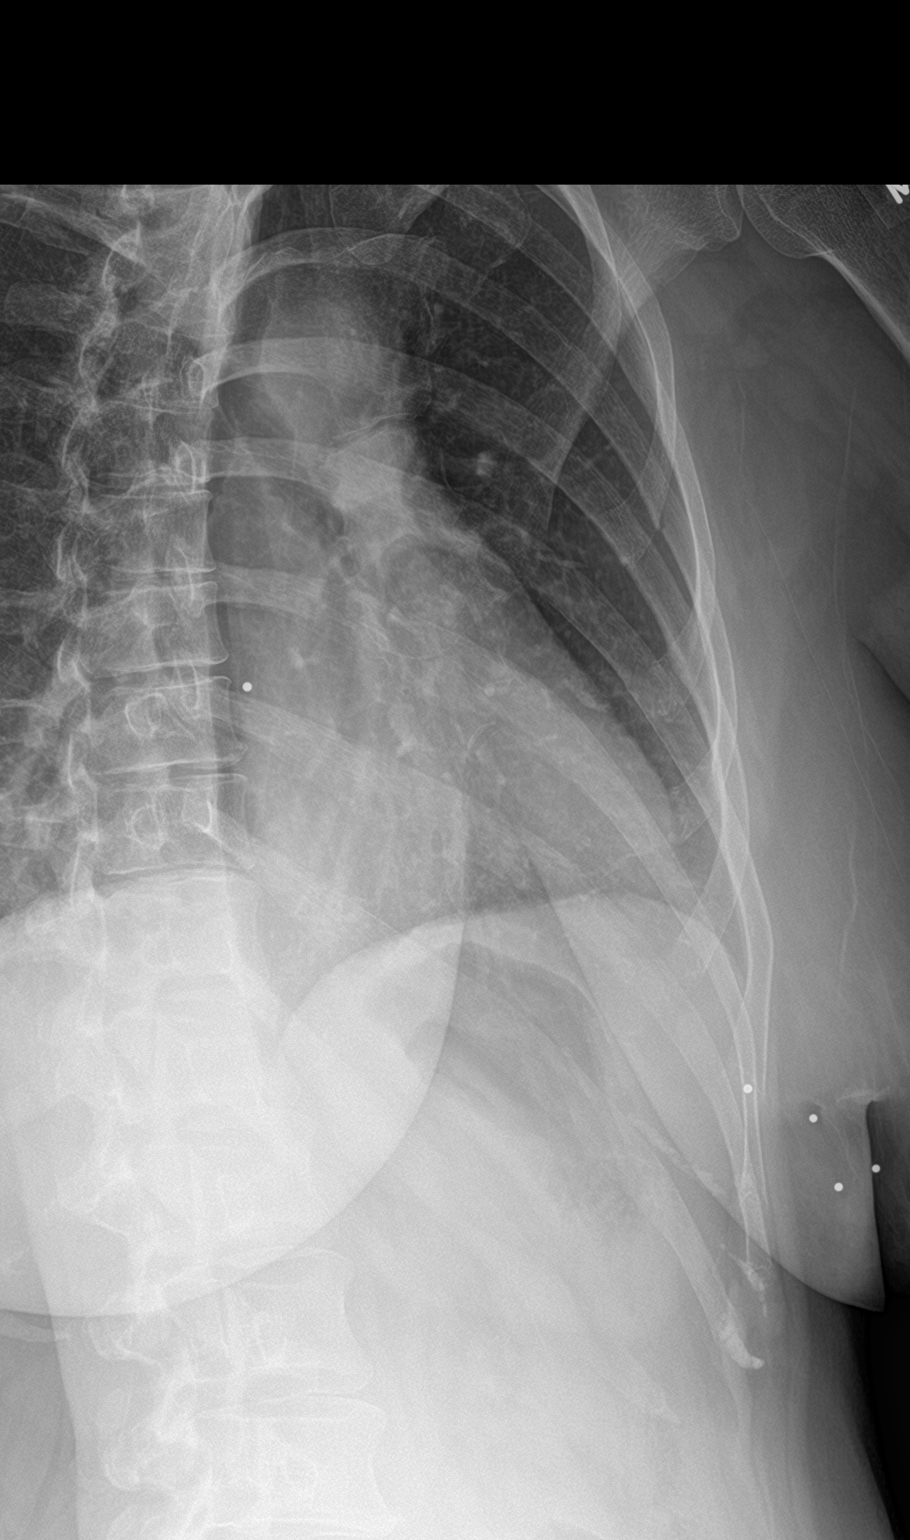

[rib pa]
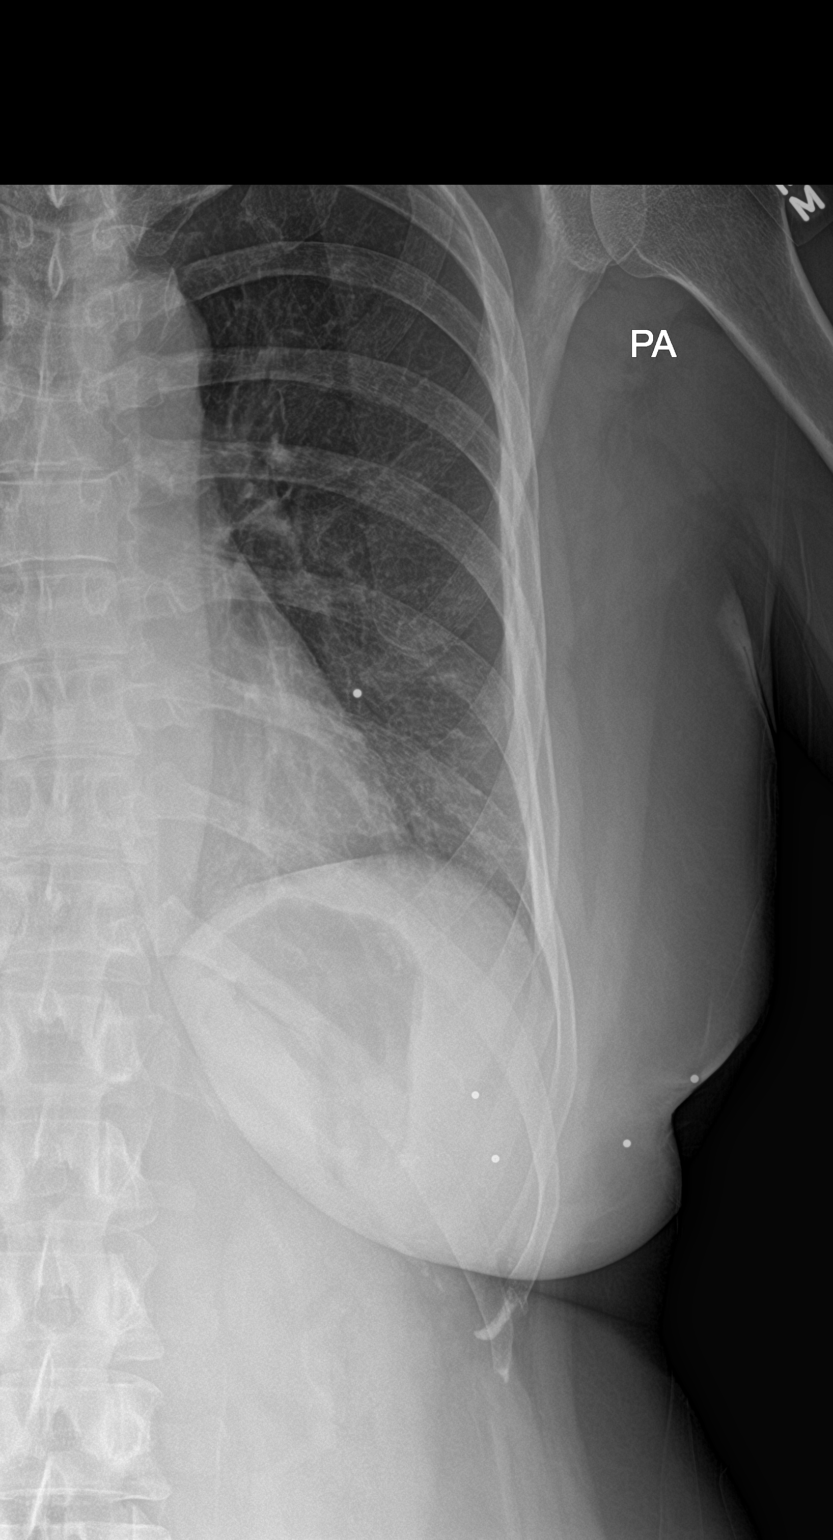

[3 of 3 positions shown; findings below may reference images not displayed]

FINDINGS: Frontal chest as well as oblique and cone-down rib images were
obtained. Lungs are clear. Heart size and pulmonary vascularity are
normal. No adenopathy.

There is no evident pneumothorax or pleural effusion. No evident rib
fractures.
IMPRESSION: No evident rib fracture.  Lungs clear.

## 2021-02-04 NOTE — Telephone Encounter (Signed)
LM on VM that results are back and to please call back. 

## 2021-02-05 ENCOUNTER — Ambulatory Visit: Payer: Self-pay | Admitting: Genetic Counselor

## 2021-02-05 DIAGNOSIS — Z1379 Encounter for other screening for genetic and chromosomal anomalies: Secondary | ICD-10-CM

## 2021-02-05 NOTE — Telephone Encounter (Signed)
Revealed negative genetic testing.  Discussed that we do not know why there is cancer in the family. It could be due to a different gene that we are not testing, or maybe our current technology may not be able to pick something up.  It will be important for her to keep in contact with genetics to keep up with whether additional testing may be needed.  

## 2021-02-05 NOTE — Progress Notes (Addendum)
HPI:  Samantha Golden was previously seen in the [place] [facility] due to a family history of breast cancer and concerns regarding a hereditary predisposition to cancer. Please refer to our prior cancer genetics clinic note for more information regarding our discussion, assessment and recommendations, at the time. Samantha Golden recent genetic test results were disclosed to her, as were recommendations warranted by these results. These results and recommendations are discussed in more detail below.  CANCER HISTORY:  Oncology History   No history exists.    FAMILY HISTORY:  We obtained a detailed, 4-generation family history.  Significant diagnoses are listed below: Family History  Problem Relation Age of Onset   Breast cancer Mother        Age 69   Healthy Father    Alcohol abuse Brother    Cancer Paternal Aunt 58       possible sarcoma   Diabetes Paternal Uncle    Breast cancer Maternal Grandmother        Age 44's   Diabetes Maternal Grandfather    Cancer Paternal Grandmother        vulvar cancer   Pneumonia Paternal Grandfather    Breast cancer Other        MGMs sister   Breast cancer Other        MGMs paternal half sister   Breast cancer Other        PGF's half sister   Cancer Other        PGMs sister with cervical cancer   Colon cancer Neg Hx    Esophageal cancer Neg Hx    Stomach cancer Neg Hx    Pancreatic cancer Neg Hx    Liver disease Neg Hx    Inflammatory bowel disease Neg Hx     The patient has three sons who are cancer free.  She has two brothers who do not have cancer.  Her parents are both living.   The patient's mother was diagnosed with breast cancer at 22.  She has three paternal half brothers who are cancer free.  Her mother was diagnosed with breast cancer in her 91's and died at 56.  She has a full sister and paternal half sister who had breast cancer and a full brother who had colon cancer.   The patient's father has a brother and sister.   The sister died at 32 from what sounds like a bone cancer.  The paternal grandmother had vulvar cancer and her sister had cervical cancer.  The paternal grandfather's half sister had breast cancer.   Samantha Golden is unaware of previous family history of genetic testing for hereditary cancer risks. Patient's maternal ancestors are of Caucasian descent, and paternal ancestors are of Caucasian descent. There is no reported Ashkenazi Jewish ancestry. There is no known consanguinity.  GENETIC TEST RESULTS: Genetic testing reported out on February 03, 2021 through the CancerNext-Expanded+RNAinsight cancer panel found no pathogenic mutations. The CancerNext-Expanded gene panel offered by [facility] and includes sequencing and rearrangement analysis for the following 77 genes: AIP, ALK, APC*, ATM*, AXIN2, BAP1, BARD1, BLM, BMPR1A, BRCA1*, BRCA2*, BRIP1*, CDC73, CDH1*, CDK4, CDKN1B, CDKN2A, CHEK2*, CTNNA1, DICER1, FANCC, FH, FLCN, GALNT12, KIF1B, LZTR1, MAX, MEN1, MET, MLH1*, MSH2*, MSH3, MSH6*, MUTYH*, NBN, NF1*, NF2, NTHL1, PALB2*, PHOX2B, PMS2*, POT1, PRKAR1A, PTCH1, PTEN*, RAD51C*, RAD51D*, RB1, RECQL, RET, SDHA, SDHAF2, SDHB, SDHC, SDHD, SMAD4, SMARCA4, SMARCB1, SMARCE1, STK11, SUFU, TMEM127, TP53*, TSC1, TSC2, VHL and XRCC2 (sequencing and deletion/duplication); EGFR, EGLN1, HOXB13, KIT, MITF, PDGFRA,  POLD1, and POLE (sequencing only); EPCAM and GREM1 (deletion/duplication only). DNA and RNA analyses performed for * genes. The test report has been scanned into EPIC and is located under the Molecular Pathology section of the Results Review tab.  A portion of the result report is included below for reference.     We discussed with Samantha Golden that because current genetic testing is not perfect, it is possible there may be a gene mutation in one of these genes that current testing cannot detect, but that chance is small.  We also discussed, that there could be another gene that has not yet been discovered, or  that we have not yet tested, that is responsible for the cancer diagnoses in the family. It is also possible there is a hereditary cause for the cancer in the family that Samantha Golden did not inherit and therefore was not identified in her testing.  Therefore, it is important to remain in touch with cancer genetics in the future so that we can continue to offer Samantha Golden the most up to date genetic testing.   ADDITIONAL GENETIC TESTING: We discussed with Samantha Golden that her genetic testing was fairly extensive.  If there are genes identified to increase cancer risk that can be analyzed in the future, we would be happy to discuss and coordinate this testing at that time.    CANCER SCREENING RECOMMENDATIONS: Samantha Golden test result is considered negative (normal).  This means that we have not identified a hereditary cause for her family history of breast cancer at this time. Most cancers happen by chance and this negative test suggests that her cancer may fall into this category.    While reassuring, this does not definitively rule out a hereditary predisposition to cancer. It is still possible that there could be genetic mutations that are undetectable by current technology. There could be genetic mutations in genes that have not been tested or identified to increase cancer risk.  Therefore, it is recommended she continue to follow the cancer management and screening guidelines provided by her primary healthcare provider.   An individual's cancer risk and medical management are not determined by genetic test results alone. Overall cancer risk assessment incorporates additional factors, including personal medical history, family history, and any available genetic information that may result in a personalized plan for cancer prevention and surveillance  Based on Samantha Golden's family of cancer, as well as her genetic test results, statistical models (Tyrer Cusik and [employer])  and literature data were used to  estimate her risk of developing breast cancer. This estimates her lifetime risk of developing breast cancer to be approximately 16.1% to 19.2%.  The patient's lifetime breast cancer risk is a preliminary estimate based on available information using one of several models endorsed by the [employer]  (NCCN).  The NCCN recommends consideration of breast MRI screening as an adjunct to mammography for patients at high risk (defined as 20% or greater lifetime risk). Please note that a woman's breast cancer risk changes over time. It may increase or decrease based on age and any changes to the personal and/or family medical history. The risks and recommendations listed above apply to this patient at this point in time. In the future, she may or may not be eligible for the same medical management strategies and, in some cases, other medical management strategies may become available to her. If she is interested in an updated breast cancer risk assessment at a later date, she can  contact us.   We, therefore, discussed that it is reasonable for Samantha Golden to be followed by a high-risk breast cancer clinic; in addition to a yearly mammogram and physical exam by a healthcare provider, she should discuss the usefulness of an annual breast MRI with the high-risk clinic providers.    RECOMMENDATIONS FOR FAMILY MEMBERS:  Individuals in this family might be at some increased risk of developing cancer, over the general population risk, simply due to the family history of cancer.  We recommended women in this family have a yearly mammogram beginning at age 38, or 97 years younger than the earliest onset of cancer, an annual clinical breast exam, and perform monthly breast self-exams. Women in this family should also have a gynecological exam as recommended by their primary provider. All family members should be referred for colonoscopy starting at age 62.  It is also possible there is a hereditary  cause for the cancer in Samantha Golden's family that she did not inherit and therefore was not identified in her.  Based on Samantha Golden's family history, we recommended her mother, who was diagnosed with breast cancer at age 83, have genetic counseling and testing. Samantha Golden will let us know if we can be of any assistance in coordinating genetic counseling and/or testing for this family member.   FOLLOW-UP: Lastly, we discussed with Samantha Golden that cancer genetics is a rapidly advancing field and it is possible that new genetic tests will be appropriate for her and/or her family members in the future. We encouraged her to remain in contact with cancer genetics on an annual basis so we can update her personal and family histories and let her know of advances in cancer genetics that may benefit this family.   Our contact number was provided. Samantha Golden questions were answered to her satisfaction, and she knows she is welcome to call us at anytime with additional questions or concerns.   Roma Kayser, [place], [facility] Licensed, Certified Genetic Counselor [email or web address]

## 2021-02-21 ENCOUNTER — Encounter (HOSPITAL_COMMUNITY): Payer: Self-pay | Admitting: Emergency Medicine

## 2021-02-21 ENCOUNTER — Other Ambulatory Visit: Payer: Self-pay

## 2021-02-21 ENCOUNTER — Ambulatory Visit (HOSPITAL_COMMUNITY)
Admission: EM | Admit: 2021-02-21 | Discharge: 2021-02-21 | Disposition: A | Discharge status: Home / Self Care | Payer: BC Managed Care – PPO | Attending: Family Medicine | Admitting: Family Medicine

## 2021-02-21 DIAGNOSIS — R21 Rash and other nonspecific skin eruption: Secondary | ICD-10-CM | POA: Diagnosis not present

## 2021-02-21 MED ORDER — PREDNISONE 20 MG PO TABS: 40.0000 mg | ORAL_TABLET | Freq: Every day | ORAL | 0 refills | Status: DC

## 2021-02-21 NOTE — ED Triage Notes (Signed)
Patient c/o rash x 1 week.   Patient endorses "redness and scaling on cheeks and under my eyes".   Patient endorses itchiness.   Patient endorses "rash is getting closer to eye".   Patient denies any new skin care regimen.   Patient has used coconut oil and Aquaphor with no relief of symptoms.

## 2021-02-21 NOTE — ED Provider Notes (Signed)
[place]   [id number] 02/21/21 Arrival Time: 1013  ASSESSMENT & PLAN:  1. Rash and nonspecific skin eruption    Unclear trigger. Will treat as contact dermatitis/reaction.  Discharge Medication List as of 02/21/2021 11:07 AM     START taking these medications   Details  predniSONE (DELTASONE) 20 MG tablet Take 2 tablets (40 mg total) by mouth daily., Starting Sat 02/21/2021, Normal       No signs of bacterial skin infection.  Will follow up with PCP or here if worsening or failing to improve as anticipated. Reviewed expectations re: course of current medical issues. Questions answered. Outlined signs and symptoms indicating need for more acute intervention. Patient verbalized understanding. After Visit Summary given.   SUBJECTIVE:  Samantha Golden is a 55 y.o. female who presents with a skin complaint. Reports "a red area under my left eye on my cheek"; few days; grad onset; does itch. No new exposures. No eye pain or visual changes. OTC coconut oil and Aquaphor without much relief. No specific aggravating or alleviating factors reported.  OBJECTIVE: Vitals:   02/21/21 1033  BP: 126/85  Pulse: 65  Resp: 18  Temp: 98.4 F (36.9 C)  TempSrc: Oral  SpO2: 96%    General appearance: alert; no distress HEENT: [place]; AT; PERRLA; EOMI Neck: supple with FROM Extremities: no edema; moves all extremities normally Skin: warm and dry; no signs of infection; approx 1x0.5 cm area of mild erythema over L cheek just under eye; with mild edema present; no TTP; cool to touch Psychological: alert and cooperative; normal mood and affect  Allergies  Allergen Reactions   Latex Shortness Of Breath, Itching and Rash    Wheezing when she removes latex gloves after working, does not use anything latex !    Other     "CLEANING MATERIALS" (bleach, ammonia)   Amoxicillin-Pot Clavulanate Rash    Past Medical History:  Diagnosis Date   Abnormal cervical Papanicolaou smear     ? LASER TREATMENT    Allergy    Anxiety    Asthma    BRCA negative 10/2007   Cancer ([place])    skin ca   Cervical dysplasia    Chronic kidney disease    Family history of breast cancer    Family history of colon cancer    Hashimoto's disease    Hypothyroidism    Pneumonia    Social History   Socioeconomic History   Marital status: Single    Spouse name: Not on file   Number of children: 3   Years of education: Not on file   Highest education level: Not on file  Occupational History   Not on file  Tobacco Use   Smoking status: Former    Packs/day: 0.25    Years: 20.00    Pack years: 5.00    Types: Cigarettes    Quit date: 07/10/2011    Years since quitting: 9.6   Smokeless tobacco: Never  Vaping Use   Vaping Use: Never used  Substance and Sexual Activity   Alcohol use: Not Currently    Comment: 2-3 drinks per day   Drug use: No   Sexual activity: Yes    Birth control/protection: Post-menopausal    Comment: MIRENA 09/04/2014- first sexual encounter 20 , fewer than 5 partners  Other Topics Concern   Not on file  Social History Narrative   Not on file   Social Determinants of Health   Financial Resource Strain: Not on file  Food Insecurity: Not on file  Transportation Needs: Not on file  Physical Activity: Not on file  Stress: Not on file  Social Connections: Not on file  Intimate Partner Violence: Not on file   Family History  Problem Relation Age of Onset   Breast cancer Mother        Age 57   Healthy Father    Alcohol abuse Brother    Cancer Paternal Aunt 66       possible sarcoma   Diabetes Paternal Uncle    Breast cancer Maternal Grandmother        Age 53's   Diabetes Maternal Grandfather    Cancer Paternal Grandmother        vulvar cancer   Pneumonia Paternal Grandfather    Breast cancer Other        MGMs sister   Breast cancer Other        MGMs paternal half sister   Breast cancer Other        PGF's half sister   Cancer Other         PGMs sister with cervical cancer   Colon cancer Neg Hx    Esophageal cancer Neg Hx    Stomach cancer Neg Hx    Pancreatic cancer Neg Hx    Liver disease Neg Hx    Inflammatory bowel disease Neg Hx    Past Surgical History:  Procedure Laterality Date   ADENOIDECTOMY     CERVIX LESION DESTRUCTION     COLPOSCOPY     INTRAUTERINE DEVICE (IUD) INSERTION  09/04/2014   Mirena   SKIN CANCER REMOVED  09/2019      Vanessa Kick, MD 02/23/21 1138

## 2021-05-14 ENCOUNTER — Other Ambulatory Visit: Payer: Self-pay | Admitting: Pulmonary Disease

## 2021-06-10 ENCOUNTER — Other Ambulatory Visit: Payer: Self-pay | Admitting: Obstetrics & Gynecology

## 2021-06-10 DIAGNOSIS — Z803 Family history of malignant neoplasm of breast: Secondary | ICD-10-CM

## 2021-06-10 DIAGNOSIS — Z1239 Encounter for other screening for malignant neoplasm of breast: Secondary | ICD-10-CM

## 2021-06-30 ENCOUNTER — Other Ambulatory Visit: Payer: Self-pay

## 2021-06-30 DIAGNOSIS — N6310 Unspecified lump in the right breast, unspecified quadrant: Secondary | ICD-10-CM

## 2021-06-30 DIAGNOSIS — N6320 Unspecified lump in the left breast, unspecified quadrant: Secondary | ICD-10-CM

## 2021-07-01 ENCOUNTER — Other Ambulatory Visit: Payer: BC Managed Care – PPO

## 2021-07-01 ENCOUNTER — Other Ambulatory Visit: Payer: Self-pay

## 2021-07-01 DIAGNOSIS — N6310 Unspecified lump in the right breast, unspecified quadrant: Secondary | ICD-10-CM

## 2021-07-01 DIAGNOSIS — N6320 Unspecified lump in the left breast, unspecified quadrant: Secondary | ICD-10-CM

## 2021-07-02 ENCOUNTER — Telehealth: Payer: Self-pay

## 2021-07-02 NOTE — Telephone Encounter (Signed)
At Dr. Assunta Curtis request I scheduled patient an appointment with Dr. Brantley Stage to follow up regarding MRI recommendation. Appointment desk transferred me to Triage nurse. Triage nurse said she will have to check with Dr. Brantley Stage before scheduling. They will call us back when they have talked with him.   ?

## 2021-07-02 NOTE — Telephone Encounter (Signed)
Per Dr. Marguerita Merles, I called patient to let her know that Dr. Marguerita Merles had peer to peer review with insurance company physician to try to authorize MRI but it was denied and they want her to see surgeon for biopsy. ? ?She said she did see Dr. Brantley Stage at [facility] back in November 2022 and he did not want to do surgery/biopsy but recommended MRI.  I called that office and had them send the note over from that visit for Dr. Marguerita Merles to review. Patient was asking that Dr. Marguerita Merles call and speak with the radiologist who saw her last because she also explained biopsy not possible. ? ?Dr. Josetta Huddle office note with recommendations will be placed on Dr. Mariah Milling desk. ?

## 2021-07-07 NOTE — Telephone Encounter (Signed)
Message from Dr. Josetta Huddle office: ? ?Samantha Golden, [place] ?Good morning Samantha Golden, it's Samantha Pap, LPN from [facility]. I spoke to you on Thurs 4/27 regarding this pt. I just wanted to reach out to let you know that after many attempts of trying to reach the pt I finally got a hold of her. Samantha Golden is scheduled for a f/u appt w/ Samantha Golden on 07/28/21 at 10:40AM. Pt is aware. ?

## 2021-08-06 ENCOUNTER — Other Ambulatory Visit: Payer: Self-pay | Admitting: Internal Medicine

## 2021-08-11 ENCOUNTER — Other Ambulatory Visit: Payer: Self-pay | Admitting: Surgery

## 2021-08-11 DIAGNOSIS — Z1239 Encounter for other screening for malignant neoplasm of breast: Secondary | ICD-10-CM

## 2021-09-02 ENCOUNTER — Telehealth: Payer: Self-pay | Admitting: *Deleted

## 2021-09-02 NOTE — Telephone Encounter (Signed)
Patient called asking if okay to see with breast MRI,patient has seen Dr.Cornett with [facility] on 07/28/21 and he placed the order this date. It appears [facility] has tried to call patient 3 times and no response back. I called patient and left message on voicemail to reach out to [facility] to call and schedule, number left on voicemail as well.

## 2021-09-16 ENCOUNTER — Ambulatory Visit
Admission: RE | Admit: 2021-09-16 | Discharge: 2021-09-16 | Disposition: A | Discharge status: Home / Self Care | Payer: BC Managed Care – PPO | Source: Ambulatory Visit | Attending: Surgery | Admitting: Surgery

## 2021-09-16 DIAGNOSIS — Z1239 Encounter for other screening for malignant neoplasm of breast: Secondary | ICD-10-CM

## 2021-09-16 MED ORDER — GADOBUTROL 1 MMOL/ML IV SOLN
7.0000 mL | Freq: Once | INTRAVENOUS | Status: AC | PRN
  Administered 2021-09-16: 7 mL via INTRAVENOUS

## 2021-09-21 ENCOUNTER — Other Ambulatory Visit: Payer: Self-pay | Admitting: Surgery

## 2021-09-21 DIAGNOSIS — R928 Other abnormal and inconclusive findings on diagnostic imaging of breast: Secondary | ICD-10-CM

## 2021-09-25 ENCOUNTER — Other Ambulatory Visit (HOSPITAL_COMMUNITY): Payer: Self-pay | Admitting: Diagnostic Radiology

## 2021-09-25 ENCOUNTER — Ambulatory Visit
Admission: RE | Admit: 2021-09-25 | Discharge: 2021-09-25 | Disposition: A | Discharge status: Home / Self Care | Payer: BC Managed Care – PPO | Source: Ambulatory Visit | Attending: Surgery | Admitting: Surgery

## 2021-09-25 DIAGNOSIS — R928 Other abnormal and inconclusive findings on diagnostic imaging of breast: Secondary | ICD-10-CM

## 2021-09-25 MED ORDER — GADOBUTROL 1 MMOL/ML IV SOLN
7.0000 mL | Freq: Once | INTRAVENOUS | Status: AC | PRN
  Administered 2021-09-25: 7 mL via INTRAVENOUS

## 2021-10-08 ENCOUNTER — Other Ambulatory Visit: Payer: Self-pay | Admitting: Obstetrics & Gynecology

## 2021-10-08 DIAGNOSIS — Z1231 Encounter for screening mammogram for malignant neoplasm of breast: Secondary | ICD-10-CM

## 2021-10-19 ENCOUNTER — Ambulatory Visit (INDEPENDENT_AMBULATORY_CARE_PROVIDER_SITE_OTHER): Payer: BC Managed Care – PPO

## 2021-10-19 ENCOUNTER — Encounter (HOSPITAL_COMMUNITY): Payer: Self-pay

## 2021-10-19 ENCOUNTER — Ambulatory Visit (HOSPITAL_COMMUNITY)
Admission: RE | Admit: 2021-10-19 | Discharge: 2021-10-19 | Disposition: A | Discharge status: Home / Self Care | Payer: BC Managed Care – PPO | Source: Ambulatory Visit | Attending: Internal Medicine | Admitting: Internal Medicine

## 2021-10-19 VITALS — BP 134/89 | HR 72 | Temp 97.9°F | Resp 16

## 2021-10-19 DIAGNOSIS — M25561 Pain in right knee: Secondary | ICD-10-CM

## 2021-10-19 MED ORDER — CYCLOBENZAPRINE HCL 10 MG PO TABS: 10.0000 mg | ORAL_TABLET | Freq: Every day | ORAL | 0 refills | Status: DC

## 2021-10-19 MED ORDER — PREDNISONE 20 MG PO TABS: 40.0000 mg | ORAL_TABLET | Freq: Every day | ORAL | 0 refills | Status: DC

## 2021-10-19 NOTE — ED Triage Notes (Signed)
Fell yesterday, sore knee after falling, pain increased throughout day. States her right knee has since been swelling and become very tender. "My body went one way, and my knee went another."

## 2021-10-19 NOTE — Discharge Instructions (Signed)
X-ray was negative for injury to the bone however a small amount of fluid is noted within the joint space, this will fix itself with time and to be reabsorbed into the tissue  Starting today take prednisone every morning with food for the next 5 days, this is to reduce the inflammatory response that occurs with injury and in turn will help with your pain, you may use Tylenol 500 to 1000 mg every 6 hours while on steroids for additional comfort as well as any topical products  You may use Flexeril at bedtime as needed for additional comfort, be mindful this medication may make you drowsy  You have been placed in a knee sleeve here in office, wear when every 6 walking and standing to add stability and support, you have also been given crutches to be used as needed  Continue use of ice in 10 to 15-minute intervals, may also use heat for comfort  May elevate extremity when sitting and lying to help reduce swelling  If your symptoms continue to persist past use of your medication, please reach out to your orthopedic specialist for further evaluation and management

## 2021-10-19 NOTE — ED Provider Notes (Signed)
[place]    CSN: [id number] Arrival date & time: 10/19/21  0844      History   Chief Complaint Chief Complaint  Patient presents with   Fall   Knee Injury    HPI Samantha Golden is a 56 y.o. female.   Patient presents with right knee pain and swelling beginning 1 day ago after fall.  Endorses that she tripped in her body and her knee went in opposite directions.  Pain and swelling is predominant to the anterior and she feels as if there is fluid present underneath the skin.  Painful to bear weight and pain is elicited with flexion and extension of the leg.  Has attempted use of ice compression which the morning which has been minimally effective.  Ice, compredsion wrpa, fall 1 day ago, intermittent numbness tot eh anterior, anterior swelling, feels like fluid,   Past Medical History:  Diagnosis Date   Abnormal cervical Papanicolaou smear    ? LASER TREATMENT    Allergy    Anxiety    Asthma    BRCA negative 10/2007   Cancer ([place])    skin ca   Cervical dysplasia    Chronic kidney disease    Family history of breast cancer    Family history of colon cancer    Hashimoto's disease    Hypothyroidism    Pneumonia     Patient Active Problem List   Diagnosis Date Noted   Genetic testing 02/04/2021   Family history of colon cancer 01/21/2021   Family history of breast cancer 01/21/2021   Mild persistent asthma without complication 48/18/5631   Alcohol use disorder, severe, dependence ([place]) 09/21/2018   Chronic diarrhea 11/08/2017   Lower abdominal pain 11/08/2017   Bloating 11/08/2017   Generalized postprandial abdominal pain 11/08/2017   Colon cancer screening 11/08/2017   Perimenopause 04/07/2017   BV (bacterial vaginosis) 12/06/2016   ADHD (attention deficit hyperactivity disorder), 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 Authorizing Provider  albuterol (VENTOLIN HFA) 108 (90 Base) MCG/ACT inhaler Inhale 1-2 puffs into the lungs every 4 (four) hours as needed for wheezing or shortness of breath. 12/19/19   Maximiano Coss, NP  ARMOUR THYROID 15 MG tablet TAKE ONE TABLET BY MOUTH DAILY 08/06/21   Philemon Kingdom, MD  ARMOUR THYROID 60 MG tablet TABLET ONE TABLET ONCE DAILY BEFORE BREAKFAST. 08/06/21   Philemon Kingdom, MD  fluticasone (FLONASE) 50 MCG/ACT nasal spray Place 1-2 sprays into both nostrils daily. 12/19/19   Maximiano Coss, NP  Fluticasone-Salmeterol (ADVAIR DISKUS) 250-50 MCG/DOSE AEPB Inhale 1 puff into the lungs in the morning  and at bedtime. 02/11/20   Margaretha Seeds, MD  predniSONE (DELTASONE) 20 MG tablet Take 2 tablets (40 mg total) by mouth daily. 02/21/21   Vanessa Kick, MD    Family History Family History  Problem Relation Age of Onset   Breast cancer Mother        Age 41   Healthy Father    Alcohol abuse Brother    Cancer Paternal Aunt 26       possible sarcoma   Diabetes Paternal Uncle    Breast cancer Maternal Grandmother        Age 39's   Diabetes Maternal Grandfather    Cancer Paternal Grandmother        vulvar cancer   Pneumonia Paternal Grandfather    Breast cancer Other        MGMs sister   Breast cancer Other        MGMs paternal half sister   Breast cancer Other        PGF's half sister   Cancer Other        PGMs sister with cervical cancer   Colon cancer Neg Hx    Esophageal cancer Neg Hx    Stomach cancer Neg Hx    Pancreatic cancer Neg Hx    Liver  disease Neg Hx    Inflammatory bowel disease Neg Hx     Social History Social History   Tobacco Use   Smoking status: Former    Packs/day: 0.25    Years: 20.00    Total pack years: 5.00    Types: Cigarettes    Quit date: 07/10/2011    Years since quitting: 10.2   Smokeless tobacco: Never  Vaping Use   Vaping Use: Never used  Substance Use Topics   Alcohol use: Not Currently    Comment: 2-3 drinks per day   Drug use: No     Allergies   Latex, Other, and Amoxicillin-pot clavulanate   Review of Systems Review of Systems Defer to HPI   Physical Exam Triage Vital Signs ED Triage Vitals [10/19/21 0858]  Enc Vitals Group     BP 134/89     Pulse Rate 72     Resp 16     Temp 97.9 F (36.6 C)     Temp Source Oral     SpO2 95 %     Weight      Height      Head Circumference      Peak Flow      Pain Score      Pain Loc      Pain Edu?      Excl. in [place]?    No data found.  Updated Vital Signs BP 134/89 (BP Location: Left Arm)   Pulse 72   Temp 97.9 F (36.6 C) (Oral)   Resp 16   LMP  (LMP Unknown)   SpO2 95%   Visual Acuity Right Eye Distance:   Left Eye Distance:   Bilateral Distance:    Right Eye Near:   Left Eye Near:    Bilateral Near:     Physical Exam Constitutional:      Appearance: Normal appearance.  HENT:     Head: Normocephalic.  Eyes:     Extraocular Movements: Extraocular movements intact.  Pulmonary:     Effort: Pulmonary effort is normal.  Musculoskeletal:     Comments: Moderate swelling with effusion noted to the anterior of the right knee, tenderness present over the  the femoral patellar tendon without ecchymosis or deformity, able to bear weight but elicits pain, range of motion is intact but elicits pain with flexion and extension, strength is a 4 out of 5, 2+ popliteal pulse, sensation intact  Neurological:     Mental Status: She is alert and oriented to person, place, and time. Mental status is at baseline.  Psychiatric:         Mood and Affect: Mood normal.        Behavior: Behavior normal.      UC Treatments / Results  Labs (all labs ordered are listed, but only abnormal results are displayed) Labs Reviewed - No data to display  EKG   Radiology No results found.  Procedures Procedures (including critical care time)  Medications Ordered in UC Medications - No data to display  Initial Impression / Assessment and Plan / UC Course  I have reviewed the triage vital signs and the nursing notes.  Pertinent labs & imaging results that were available during my care of the patient were reviewed by me and considered in my medical decision making (see chart for details).  Acute right knee pain  X-ray negative, small effusion noted, discussed with patient, knee sleeve applied in office and given crutches for stability and support, recommended continued use of ice or heat as well as elevation for measures to reduce swelling, prednisone and Flexeril prescribed for outpatient use, patient is already established at orthopedic office, recommended follow-up in 7 days if symptoms have not improved Final Clinical Impressions(s) / UC Diagnoses   Final diagnoses:  None   Discharge Instructions   None    ED Prescriptions   None    PDMP not reviewed this encounter.   Hans Eden, NP 10/19/21 1030

## 2021-11-11 ENCOUNTER — Ambulatory Visit
Admission: RE | Admit: 2021-11-11 | Discharge: 2021-11-11 | Disposition: A | Discharge status: Home / Self Care | Payer: BC Managed Care – PPO | Source: Ambulatory Visit | Attending: Obstetrics & Gynecology | Admitting: Obstetrics & Gynecology

## 2021-11-11 DIAGNOSIS — Z1231 Encounter for screening mammogram for malignant neoplasm of breast: Secondary | ICD-10-CM

## 2021-12-09 ENCOUNTER — Ambulatory Visit: Payer: BC Managed Care – PPO | Admitting: Obstetrics & Gynecology

## 2021-12-18 ENCOUNTER — Ambulatory Visit: Payer: BC Managed Care – PPO | Admitting: Obstetrics & Gynecology

## 2022-01-07 ENCOUNTER — Ambulatory Visit: Payer: BC Managed Care – PPO | Admitting: Internal Medicine

## 2022-01-08 ENCOUNTER — Encounter: Payer: Self-pay | Admitting: Obstetrics & Gynecology

## 2022-01-08 ENCOUNTER — Ambulatory Visit (INDEPENDENT_AMBULATORY_CARE_PROVIDER_SITE_OTHER): Payer: BC Managed Care – PPO | Admitting: Obstetrics & Gynecology

## 2022-01-08 ENCOUNTER — Other Ambulatory Visit (HOSPITAL_COMMUNITY)
Admission: RE | Admit: 2022-01-08 | Discharge: 2022-01-08 | Disposition: A | Discharge status: Home / Self Care | Payer: BC Managed Care – PPO | Source: Ambulatory Visit | Attending: Obstetrics & Gynecology | Admitting: Obstetrics & Gynecology

## 2022-01-08 VITALS — BP 106/68 | HR 79 | Ht 65.5 in | Wt 151.0 lb

## 2022-01-08 DIAGNOSIS — R87610 Atypical squamous cells of undetermined significance on cytologic smear of cervix (ASC-US): Secondary | ICD-10-CM | POA: Insufficient documentation

## 2022-01-08 DIAGNOSIS — Z01419 Encounter for gynecological examination (general) (routine) without abnormal findings: Secondary | ICD-10-CM

## 2022-01-08 DIAGNOSIS — Z780 Asymptomatic menopausal state: Secondary | ICD-10-CM

## 2022-01-08 NOTE — Progress Notes (Signed)
Samantha Golden 02-Oct-1965 [id number]   History:    56 y.o. G3P3L3   RP:  Established patient presenting for annual gyn exam    HPI: Postmenopausal, well on no HRT.  No PMB.  No pelvic pain.  Pap ASCUS with Neg HPV HR 11/2020. Pap reflex today.  MRI of breasts 09/2021. Rt Rt breast Bx 09/2021 Benign.  Mother with breast Ca in her 72's. Colono 12/2017.  BMI 24.75.  Fitness and healthy nutrition. Fasting Health labs here today.  Will establish with a Fam MD. Flu vaccine declined.   Past medical history,surgical history, family history and social history were all reviewed and documented in the EPIC chart.  Gynecologic History No LMP recorded (lmp unknown). Patient is postmenopausal.  Obstetric History OB History  Gravida Para Term Preterm AB Living  _0 SAB IAB Ectopic Multiple Live Births               # Outcome Date GA Lbr Len/2nd Weight Sex Delivery Anes PTL Lv  3 Term           2 Term           1 Term              ROS: A ROS was performed and pertinent positives and negatives are included in the history. GENERAL: No fevers or chills. HEENT: No change in vision, no earache, sore throat or sinus congestion. NECK: No pain or stiffness. CARDIOVASCULAR: No chest pain or pressure. No palpitations. PULMONARY: No shortness of breath, cough or wheeze. GASTROINTESTINAL: No abdominal pain, nausea, vomiting or diarrhea, melena or bright red blood per rectum. GENITOURINARY: No urinary frequency, urgency, hesitancy or dysuria. MUSCULOSKELETAL: No joint or muscle pain, no back pain, no recent trauma. DERMATOLOGIC: No rash, no itching, no lesions. ENDOCRINE: No polyuria, polydipsia, no heat or cold intolerance. No recent change in weight. HEMATOLOGICAL: No anemia or easy bruising or bleeding. NEUROLOGIC: No headache, seizures, numbness, tingling or weakness. PSYCHIATRIC: No depression, no loss of interest in normal activity or change in sleep pattern.     Exam:   BP 106/68   Pulse 79    Ht 5' 5.5" (1.664 m)   Wt 151 lb (68.5 kg)   LMP  (LMP Unknown)   SpO2 99%   BMI 24.75 kg/m   Body mass index is 24.75 kg/m.  General appearance : Well developed well nourished female. No acute distress HEENT: Eyes: no retinal hemorrhage or exudates,  Neck supple, trachea midline, no carotid bruits, no thyroidmegaly Lungs: Clear to auscultation, no rhonchi or wheezes, or rib retractions  Heart: Regular rate and rhythm, no murmurs or gallops Breast:Examined in sitting and supine position were symmetrical in appearance, no palpable masses or tenderness,  no skin retraction, no nipple inversion, no nipple discharge, no skin discoloration, no axillary or supraclavicular lymphadenopathy Abdomen: no palpable masses or tenderness, no rebound or guarding Extremities: no edema or skin discoloration or tenderness  Pelvic: Vulva: Normal             Vagina: No gross lesions or discharge  Cervix: No gross lesions or discharge.  Pap reflex done.  Uterus  AV, normal size, shape and consistency, non-tender and mobile  Adnexa  Without masses or tenderness  Anus: Normal   Assessment/Plan:  56 y.o. female for annual exam   1. Encounter for routine gynecological examination with Papanicolaou smear of cervix Postmenopausal, well on no HRT.  No  PMB.  No pelvic pain.  Pap ASCUS with Neg HPV HR 11/2020. Pap reflex today.  MRI of breasts 09/2021. Rt Rt breast Bx 09/2021 Benign.  Mother with breast Ca in her 58's. Colono 12/2017.  BMI 24.75.  Fitness and healthy nutrition. Fasting Health labs here today.  Will establish with a Fam MD. Flu vaccine declined.  - CBC - Comp Met (CMET) - Lipid Profile - TSH - Vitamin D (25 hydroxy) - Cytology - PAP( [place])  2. ASCUS of cervix with negative high risk HPV - Cytology - PAP( [place])  3. Postmenopause Postmenopausal, well on no HRT.  No PMB.  No pelvic pain.   Other orders - azelastine (ASTELIN) 0.1 % nasal spray; Place into both nostrils. (Patient  not taking: Reported on 01/08/2022) - fluorouracil (EFUDEX) 5 % cream; Apply 1 Application topically 2 (two) times daily. (Patient not taking: Reported on 01/08/2022) - IBU 400 MG tablet; Take by mouth.   Princess Bruins MD, 11:56 AM 01/08/2022

## 2022-01-09 LAB — COMPREHENSIVE METABOLIC PANEL
AG Ratio: 1.4 (calc) (ref 1.0–2.5)
ALT: 29 U/L (ref 6–29)
AST: 30 U/L (ref 10–35)
Albumin: 4.6 g/dL (ref 3.6–5.1)
Alkaline phosphatase (APISO): 79 U/L (ref 37–153)
BUN: 8 mg/dL (ref 7–25)
CO2: 25 mmol/L (ref 20–32)
Calcium: 10.2 mg/dL (ref 8.6–10.4)
Chloride: 98 mmol/L (ref 98–110)
Creat: 0.55 mg/dL (ref 0.50–1.03)
Globulin: 3.2 g/dL (calc) (ref 1.9–3.7)
Glucose, Bld: 97 mg/dL (ref 65–99)
Potassium: 3.7 mmol/L (ref 3.5–5.3)
Sodium: 135 mmol/L (ref 135–146)
Total Bilirubin: 0.8 mg/dL (ref 0.2–1.2)
Total Protein: 7.8 g/dL (ref 6.1–8.1)

## 2022-01-09 LAB — CBC
HCT: 45.5 % — ABNORMAL HIGH (ref 35.0–45.0)
Hemoglobin: 15.9 g/dL — ABNORMAL HIGH (ref 11.7–15.5)
MCH: 34.5 pg — ABNORMAL HIGH (ref 27.0–33.0)
MCHC: 34.9 g/dL (ref 32.0–36.0)
MCV: 98.7 fL (ref 80.0–100.0)
MPV: 9.4 fL (ref 7.5–12.5)
Platelets: 168 10*3/uL (ref 140–400)
RBC: 4.61 10*6/uL (ref 3.80–5.10)
RDW: 12.3 % (ref 11.0–15.0)
WBC: 9.8 10*3/uL (ref 3.8–10.8)

## 2022-01-09 LAB — LIPID PANEL
Cholesterol: 246 mg/dL — ABNORMAL HIGH (ref <200)
HDL: 104 mg/dL (ref >=50)
LDL Cholesterol (Calc): 125 mg/dL (calc) — ABNORMAL HIGH
Non-HDL Cholesterol (Calc): 142 mg/dL (calc) — ABNORMAL HIGH (ref <130)
Total CHOL/HDL Ratio: 2.4 (calc) (ref <5.0)
Triglycerides: 76 mg/dL (ref <150)

## 2022-01-09 LAB — TSH: TSH: 3.31 mIU/L

## 2022-01-09 LAB — VITAMIN D 25 HYDROXY (VIT D DEFICIENCY, FRACTURES): Vit D, 25-Hydroxy: 12 ng/mL — ABNORMAL LOW (ref 30–100)

## 2022-01-12 ENCOUNTER — Encounter: Payer: Self-pay | Admitting: *Deleted

## 2022-01-13 LAB — CYTOLOGY - PAP
Comment: NEGATIVE
Diagnosis: UNDETERMINED — AB
High risk HPV: NEGATIVE

## 2022-01-13 NOTE — Telephone Encounter (Signed)
Dr.Lavoie replied " FLP:  Better to have a normal LDL, but why I was not concerned is because her HDL, good cholesterol, is very good which brings her Total Cholest/HDL at a very good ratio of 2.4.  Therefore low risks of causing atherosclerosis.   No prescriptions for Vit D3 and Vit K2, but OTC available with both together. Dr Dellis Filbert

## 2022-01-13 NOTE — Telephone Encounter (Signed)
Please see patient question about vitamin d3 with K2.

## 2022-01-15 ENCOUNTER — Other Ambulatory Visit: Payer: Self-pay

## 2022-01-15 DIAGNOSIS — R87610 Atypical squamous cells of undetermined significance on cytologic smear of cervix (ASC-US): Secondary | ICD-10-CM

## 2022-02-01 MED ORDER — VITAMIN D (ERGOCALCIFEROL) 1.25 MG (50000 UNIT) PO CAPS: 50000.0000 [IU] | ORAL_CAPSULE | ORAL | 0 refills | Status: AC

## 2022-02-09 ENCOUNTER — Ambulatory Visit: Payer: BC Managed Care – PPO | Admitting: Obstetrics & Gynecology

## 2022-02-09 ENCOUNTER — Other Ambulatory Visit (HOSPITAL_COMMUNITY)
Admission: RE | Admit: 2022-02-09 | Discharge: 2022-02-09 | Disposition: A | Discharge status: Home / Self Care | Payer: BC Managed Care – PPO | Source: Ambulatory Visit | Attending: Obstetrics & Gynecology | Admitting: Obstetrics & Gynecology

## 2022-02-09 ENCOUNTER — Encounter: Payer: Self-pay | Admitting: Obstetrics & Gynecology

## 2022-02-09 DIAGNOSIS — R87610 Atypical squamous cells of undetermined significance on cytologic smear of cervix (ASC-US): Secondary | ICD-10-CM | POA: Diagnosis present

## 2022-02-09 NOTE — Progress Notes (Signed)
    Samantha Golden 06-11-1965 [id number]        56 y.o.  G3P3L3   RP: ASCUS x 2 with HPV HR Neg for Colposcopy  HPI: ASCUS/HPV HR Neg on Pap 01/08/22.  ASCUS/HPV HR Neg in 2022.   OB History  Gravida Para Term Preterm AB Living  '3 3 3     3  '$ SAB IAB Ectopic Multiple Live Births               # Outcome Date GA Lbr Len/2nd Weight Sex Delivery Anes PTL Lv  3 Term           2 Term           1 Term             Past medical history,surgical history, problem list, medications, allergies, family history and social history were all reviewed and documented in the EPIC chart.   Directed ROS with pertinent positives and negatives documented in the history of present illness/assessment and plan.  Exam:  There were no vitals filed for this visit. General appearance:  Normal  Colposcopy Procedure Note Samantha Golden 02/09/2022  Indications: ASCUS/HPV HR Neg x 2  Procedure Details  The risks and benefits of the procedure and Written informed consent obtained.  Speculum placed in vagina and excellent visualization of cervix achieved, cervix swabbed x 3 with acetic acid solution.  Findings:  Cervix colposcopy: Physical Exam Genitourinary:       Vaginal colposcopy: Normal  Vulvar colposcopy: Normal  Perirectal colposcopy: Normal  The cervix was sprayed with Hurricane before performing the cervical biopsies.  Specimens: Cervical Bxs at 3 and 6 O'Clock  Complications:  None . Plan:  Management per results   Assessment/Plan:  56 y.o. G3P3003   1. ASCUS of cervix with negative high risk HPV ASCUS/HPV HR Neg on Pap 01/08/22.  ASCUS/HPV HR Neg in 2022.  Counseling on ASCUS/HPV HR Neg done.  Colposcopy procedure reviewed, findings discussed.  Management per Cervical Bx results. Post procedure precautions. - Colposcopy - Surgical pathology( [place]/ [facility])   Princess Bruins MD, 12:16 PM 02/09/2022

## 2022-02-11 LAB — SURGICAL PATHOLOGY

## 2022-02-21 ENCOUNTER — Encounter: Payer: Self-pay | Admitting: Obstetrics & Gynecology

## 2022-02-23 ENCOUNTER — Ambulatory Visit: Payer: BC Managed Care – PPO | Admitting: Internal Medicine

## 2022-02-23 ENCOUNTER — Encounter: Payer: Self-pay | Admitting: Internal Medicine

## 2022-02-23 VITALS — BP 120/80 | HR 60 | Ht 65.5 in | Wt 151.8 lb

## 2022-02-23 DIAGNOSIS — E038 Other specified hypothyroidism: Secondary | ICD-10-CM | POA: Diagnosis not present

## 2022-02-23 DIAGNOSIS — E063 Autoimmune thyroiditis: Secondary | ICD-10-CM

## 2022-02-23 NOTE — Patient Instructions (Addendum)
Please stop at the lab.  Please continue Armour 75 daily.  Try to restart Selenium 200 mcg daily.  Take the thyroid hormone every day, with water, at least 30 minutes before breakfast, separated by at least 4 hours from: - acid reflux medications - calcium - iron - multivitamins  Please return in 1 year.

## 2022-02-23 NOTE — Progress Notes (Unsigned)
Patient ID: Samantha Golden, female   DOB: [birth year], 56 y.o.   MRN: [id number]  HPI  Samantha Golden is a 56 y.o.-year-old female, initially referred by Dr Phineas Real, returning for f/u for uncontrolled Hashimoto's hypothyroidism. Last visit 1 year and 2 months ago.  Interim history: Her brain fog and fatigue improved. She continues to have some insomnia - improved, joint pains - improved, tremors (brother also has these).  She noticed less eyelashes and eyebrows.  Since last visit, she had an abnormal Pap smear.  She had biopsy which showed mild dysplasia (CIN-1). She was also investigated for a mass in her right breast >> biopsy was benign.  Reviewed history: Pt. has been dx with hypothyroidism in ~1990 (college).   Previously on Synthroid DAW 225 mcg >> 300 mcg >> ... >> Currently on Armour  In 09/2017, she was on Armour 90 mg alternating with 120 mg every other day >> we had to decrease the dose to 90 mg daily.  Subsequent TSH was still slightly abnormal but improved.  Due to her symptoms, we continued the same dose of Armour at that time.  In 04/2019, we decreased the dose of Armour to 75 mg daily.  In 12/2018, we added selenium 200 mcg daily. Not on this now.  She takes Armour 75 mg daily: - in am - fasting - at least  45-60 min from coffee and b'fast - no Ca, Fe, PPIs - not on Biotin On vitamin D.  Reviewed her TFTs: Lab Results  Component Value Date   TSH 3.31 01/08/2022   TSH 2.26 01/02/2021   TSH 2.42 07/03/2020   TSH 2.61 12/27/2019   TSH 2.07 06/01/2019   TSH 0.14 (L) 04/19/2019   TSH 0.266 (L) 03/26/2019   TSH 0.79 09/18/2018   TSH 0.50 04/07/2018   TSH 0.26 (L) 12/02/2017   FREET4 0.51 (L) 01/02/2021   FREET4 0.45 (L) 07/03/2020   FREET4 0.65 12/27/2019   FREET4 0.51 (L) 06/01/2019   FREET4 0.70 04/19/2019   FREET4 0.98 03/26/2019   FREET4 0.59 (L) 04/07/2018   FREET4 0.62 12/02/2017   FREET4 0.65 10/03/2017   FREET4 0.92 04/07/2017  TSH 4.26 in  2012. She has been out of the LT4 for ~ 1 mo before the test in 10/2013.  She had elevated TPO antibodies, confirming Hashimoto's thyroiditis:  Component     Latest Ref Rng & Units 12/27/2019  Thyroperoxidase Ab SerPl-aCnc     <9 IU/mL 272 (H)  Thyroglobulin Ab     < or = 1 IU/mL 5 (H)   Component     Latest Ref Rng & Units 10/23/2013  Thyroperoxidase Ab SerPl-aCnc     <35.0 IU/mL 265.0 (H)  Thyroglobulin Ab     <40.0 IU/mL 38.2   She had palpitations when she took Adderall with the LT4 300 mcg.  Pt denies: - feeling nodules in neck - hoarseness - dysphagia - choking  She also has a history of elevated LFTs and ascites. She had bilateral nephrolithiasis on CT from 01/2020.  ROS: + see HPI  I reviewed pt's medications, allergies, PMH, social hx, family hx, and changes were documented in the history of present illness. Otherwise, unchanged from my initial visit note.  Past Medical History:  Diagnosis Date   Abnormal cervical Papanicolaou smear    ? LASER TREATMENT    Allergy    Anxiety    Asthma    BRCA negative 10/2007   Cancer ([place])    skin  ca   Cervical dysplasia    Chronic kidney disease    Family history of breast cancer    Family history of colon cancer    Hashimoto's disease    Hypothyroidism    Pneumonia    Past Surgical History:  Procedure Laterality Date   ADENOIDECTOMY     CERVIX LESION DESTRUCTION     COLPOSCOPY     INTRAUTERINE DEVICE (IUD) INSERTION  09/04/2014   Mirena   SKIN CANCER REMOVED  09/2019   History   Social History   Marital Status: Divorced    Spouse Name: N/A    Number of Children: 3   Occupational History   teacher   Social History Main Topics   Smoking status: Former Smoker   Smokeless tobacco: Never Used   Alcohol Use: 1.0 oz/week    2 drink(s) per week   Drug Use: No   Sexual Activity: Yes    Birth Web designer: IUD     Comment: ParaGard 08/2012   Current Outpatient Medications on File Prior to Visit   Medication Sig Dispense Refill   ARMOUR THYROID 15 MG tablet TAKE ONE TABLET BY MOUTH DAILY 90 tablet 3   ARMOUR THYROID 60 MG tablet TABLET ONE TABLET ONCE DAILY BEFORE BREAKFAST. 90 tablet 3   azelastine (ASTELIN) 0.1 % nasal spray Place into both nostrils.     fluorouracil (EFUDEX) 5 % cream Apply 1 Application topically 2 (two) times daily.     IBU 400 MG tablet Take by mouth.     Vitamin D, Ergocalciferol, (DRISDOL) 1.25 MG (50000 UNIT) CAPS capsule Take 1 capsule (50,000 Units total) by mouth every 7 (seven) days. 12 capsule 0   No current facility-administered medications on file prior to visit.   Allergies  Allergen Reactions   Latex Shortness Of Breath, Itching and Rash    Wheezing when she removes latex gloves after working, does not use anything latex !    Other     "CLEANING MATERIALS" (bleach, ammonia)   Amoxicillin-Pot Clavulanate Rash   Family History  Problem Relation Age of Onset   Breast cancer Mother        Age 28   Healthy Father    Alcohol abuse Brother    Cancer Paternal Aunt 42       possible sarcoma   Diabetes Paternal Uncle    Breast cancer Maternal Grandmother        Age 55's   Diabetes Maternal Grandfather    Cancer Paternal Grandmother        vulvar cancer   Pneumonia Paternal Grandfather    Breast cancer Other        MGMs sister   Breast cancer Other        MGMs paternal half sister   Breast cancer Other        PGF's half sister   Cancer Other        PGMs sister with cervical cancer   Colon cancer Neg Hx    Esophageal cancer Neg Hx    Stomach cancer Neg Hx    Pancreatic cancer Neg Hx    Liver disease Neg Hx    Inflammatory bowel disease Neg Hx    PE: BP 120/80 (BP Location: Right Arm, Patient Position: Sitting, Cuff Size: Normal)   Pulse 60   Ht 5' 5.5" (1.664 m)   Wt 151 lb 12.8 oz (68.9 kg)   LMP  (LMP Unknown)   SpO2 99%   BMI 24.88 kg/m  Wt Readings from Last 3 Encounters:  02/23/22 151 lb 12.8 oz (68.9 kg)  01/08/22 151  lb (68.5 kg)  01/02/21 148 lb (67.1 kg)   Constitutional: normal weight, in NAD Eyes: EOMI, no exophthalmos ENT: no thyromegaly, no cervical lymphadenopathy Cardiovascular: RRR, No MRG Respiratory: CTA B Musculoskeletal: no deformities Skin: moist, warm, no rashes Neurological: + Very faint tremor with outstretched hands  ASSESSMENT: 1. Hashimoto's Hypothyroidism - did not feel good on Synthroid, Levothyroxine  PLAN:  1. Patient with longstanding, uncontrolled, Hashimoto's hypothyroidism, on Armour Thyroid.  She tried generic levothyroxine in the past and was on brand-name Synthroid but she did not feel good on these.  She felt better on Armour, but she does continue to have nonspecific symptoms, possibly also related to menopause.  At last visit, she still had chronic fatigue and brain fog, but they were slightly improved.  She also described inability to lose weight, diarrhea alternating with constipation, dry skin, memory problems.  We discussed that in the setting of normal thyroid tests, the symptoms did not appear to be related to her thyroid condition.  However, I did suggest selenium again (she used this in the past), to improve her thyroid antibody titer.  She did not use it consistently since last visit. - latest thyroid labs reviewed with pt. >> normal: Lab Results  Component Value Date   TSH 3.31 01/08/2022  - she continues on Armour 75 mg daily - pt feels good on this dose, but noticed less hair in her eyebrows and also loss of lashes.  She also has some symptoms which could be attributed to menopause like insomnia, joint aches -however, these worsen when she eats gluten.  We did discuss about possibly adding selenium 200 mcg daily to help with decreasing thyroid inflammation and antibody titer. - we discussed about taking the thyroid hormone every day, with water, >30 minutes before breakfast, separated by >4 hours from acid reflux medications, calcium, iron, multivitamins. Pt.  is taking it correctly. - will check thyroid tests today: TSH, free T3 and fT4 and change the Armour dose accordingly.   - If labs are abnormal, she will need to return for repeat TFTs in 1.5 months - I will see her back in a year  - Total time spent for the visit: 25 min, in precharting, post charting, obtaining medical information from the chart and from the pt, reviewing her  previous labs, evaluations, and treatments, reviewing her symptoms, counseling her about her thyroid condition (please see the discussed topics above), and developing a plan to further investigate and treat it; she had a number of questions which I addressed.  Philemon Kingdom, MD PhD [facility] Endocrinology

## 2022-02-24 LAB — T4, FREE: Free T4: 0.48 ng/dL — ABNORMAL LOW (ref 0.60–1.60)

## 2022-02-24 LAB — TSH: TSH: 2.79 u[IU]/mL (ref 0.35–5.50)

## 2022-02-24 LAB — T3, FREE: T3, Free: 4 pg/mL (ref 2.3–4.2)

## 2022-03-11 ENCOUNTER — Ambulatory Visit: Payer: BC Managed Care – PPO | Admitting: Internal Medicine

## 2022-03-25 ENCOUNTER — Encounter: Payer: Self-pay | Admitting: Obstetrics & Gynecology

## 2022-03-25 ENCOUNTER — Ambulatory Visit: Payer: BC Managed Care – PPO | Admitting: Obstetrics & Gynecology

## 2022-03-25 VITALS — BP 110/76 | HR 75 | Temp 98.2°F

## 2022-03-25 DIAGNOSIS — N898 Other specified noninflammatory disorders of vagina: Secondary | ICD-10-CM

## 2022-03-25 DIAGNOSIS — R3915 Urgency of urination: Secondary | ICD-10-CM | POA: Diagnosis not present

## 2022-03-25 LAB — WET PREP FOR TRICH, YEAST, CLUE

## 2022-03-25 MED ORDER — SULFAMETHOXAZOLE-TRIMETHOPRIM 800-160 MG PO TABS: 1.0000 | ORAL_TABLET | Freq: Two times a day (BID) | ORAL | 0 refills | Status: AC

## 2022-03-25 NOTE — Progress Notes (Signed)
    Samantha Golden 08/29/1965 [id number]        57 y.o.  G3P3003   RP: Urinary urgency x this morning  HPI: Pt c/o lower pelvic pain, bloating & urgency that started this morning. Pt unsure if she saw blood in the toilet or not.  No vaginal discharge, no itching, no odor.  Abstinent. Postmenopause, on no HRT.  No PMB. BMs normal. No fever.   OB History  Gravida Para Term Preterm AB Living  '3 3 3     3  '$ SAB IAB Ectopic Multiple Live Births               # Outcome Date GA Lbr Len/2nd Weight Sex Delivery Anes PTL Lv  3 Term           2 Term           1 Term             Past medical history,surgical history, problem list, medications, allergies, family history and social history were all reviewed and documented in the EPIC chart.   Directed ROS with pertinent positives and negatives documented in the history of present illness/assessment and plan.  Exam:  Vitals:   03/25/22 1145  BP: 110/76  Pulse: 75  Temp: 98.2 F (36.8 C)  TempSrc: Oral  SpO2: 98%   General appearance:  Normal  CVAT Neg bilaterally  Abdomen: Normal  Gynecologic exam: Vulva normal.  Speculum:  Cervix/Vagina normal.  Mild vaginal discharge, wet prep done. No blood.  Bimanual exam:  Uterus AV, normal volume, mobile, NT.  No adnexal mass, NT.  Wet prep Negative  U/A: Dark yellow, slightly cloudy, protein negative, nitrites negative, white blood cells negative, red blood cells 20-40, bacteria few.  Urine culture pending.   Assessment/Plan:  57 y.o. G3P3003   1. Urinary urgency Hemorrhagic cystitis.  Will treat with Bactrim DS 1 tab PO BID x 3 days.  Usage reviewed, prescription sent to pharmacy.  Push water intake.  Precautions given, will call back if no improvement in Sxs. - Urinalysis,Complete w/RFL Culture  2. Vaginal discharge Wet prep Negative - WET PREP FOR TRICH, YEAST, CLUE  Other orders - sulfamethoxazole-trimethoprim (BACTRIM DS) 800-160 MG tablet; Take 1 tablet by mouth 2 (two)  times daily for 3 days. - Urine Culture - REFLEXIVE URINE CULTURE   Princess Bruins MD, 11:58 AM 03/25/2022

## 2022-03-26 NOTE — Addendum Note (Signed)
Addended by: Burnice Logan on: 03/26/2022 10:14 AM   Modules accepted: Orders

## 2022-03-27 LAB — URINALYSIS, COMPLETE W/RFL CULTURE
Casts: NONE SEEN /LPF
Crystals: NONE SEEN /HPF
Glucose, UA: NEGATIVE
Hyaline Cast: NONE SEEN /LPF
Leukocyte Esterase: NEGATIVE
Nitrites, Initial: NEGATIVE
Protein, ur: NEGATIVE
Specific Gravity, Urine: 1.02 (ref 1.001–1.035)
WBC, UA: NONE SEEN /HPF (ref 0–5)
Yeast: NONE SEEN /HPF
pH: 5.5 (ref 5.0–8.0)

## 2022-03-27 LAB — URINE CULTURE
MICRO NUMBER:: 14443568
Result:: NO GROWTH
SPECIMEN QUALITY:: ADEQUATE

## 2022-03-27 LAB — CULTURE INDICATED

## 2022-03-29 ENCOUNTER — Encounter: Payer: Self-pay | Admitting: *Deleted

## 2022-04-05 ENCOUNTER — Telehealth: Payer: Self-pay

## 2022-04-05 DIAGNOSIS — R102 Pelvic and perineal pain: Secondary | ICD-10-CM

## 2022-04-05 DIAGNOSIS — R319 Hematuria, unspecified: Secondary | ICD-10-CM

## 2022-04-05 DIAGNOSIS — R350 Frequency of micturition: Secondary | ICD-10-CM

## 2022-04-05 NOTE — Telephone Encounter (Signed)
Per ML: "We can definitely schedule a Pelvic US with me as well."   Referral faxed successfully.   Left detailed msg on VM per DPR.  Will place order and send msg to appt desk.

## 2022-04-05 NOTE — Telephone Encounter (Signed)
Pt calling to report that since completing abx from visit on 03/25/2022, her sxs have not resolved. Still experiencing hematuria, pelvic pains, and frequency of urination. Mentioned that you mentioned something about an OV w/ [place] if sxs had not resolved. Ucx returned neg from 03/25/22 visit.  Please advise.

## 2022-04-05 NOTE — Telephone Encounter (Signed)
Pt notified and voiced understanding. Accepts referral to Urology. She just wanted me to confirm with you that you didn't want to perform a pelvic US due to her pelvic bloating/discomfort. Pt reports she wasn't having any bowel issues at the time of her visit but reports this past week, has had ~2-3 days of constipation. Pt confirmed that she didn't recall you stating you felt any abnormalities during bimanual exam. However, pt just wanted me to confirm with you. Please advise. Thanks.

## 2022-04-05 NOTE — Telephone Encounter (Signed)
Per ML: "Given that she had hematuria with Negative U. Culture and still has hematuria, refer to Urology for further investigation."

## 2022-04-06 NOTE — Telephone Encounter (Signed)
Pt wanted to inquire about how soon you feel she should have [place]? States appt desk made her aware no availability for [place] in our office until 05/04/2022 unless doc Oks for her to go to external location for imaging.   Also is inquiring if you possibly would be willing send in the Bactrim again due to pt getting temporary relief of her sxs with taking it. Pt reports that she would be able to come in this afternoon possibly if you required her to.   Please advise.

## 2022-04-08 MED ORDER — SULFAMETHOXAZOLE-TRIMETHOPRIM 800-160 MG PO TABS: 1.0000 | ORAL_TABLET | Freq: Two times a day (BID) | ORAL | 0 refills | Status: AC

## 2022-04-08 NOTE — Telephone Encounter (Signed)
04/06/2022: Per ML: "Pelvic US here 05/04/22 is good. This way, we might have Urology opinion/management as well."  04/07/2022: Per ML: "Agree to try Bactrim DS 1 tab PO BID x 3 days.  Please inform patient that a U. Culture should be done if sxs reoccur after that."   Pt notified and voiced understanding.   Now scheduled for 04/22/2022 for [place] and visit w/ ML.

## 2022-04-09 ENCOUNTER — Telehealth: Payer: Self-pay | Admitting: Obstetrics and Gynecology

## 2022-04-09 ENCOUNTER — Other Ambulatory Visit: Payer: Self-pay | Admitting: Obstetrics and Gynecology

## 2022-04-09 NOTE — Telephone Encounter (Signed)
Call from patient after hours  Reports continued symptoms despite recent Rx: Bactrim DS on 04/07/22 C/O LLQ pain with radiation to back, bloating, urinary frequency and post-void dribbling. Denies dysuria. Gross hematuria resolved for 2-3 days. Chart reviewed: urine culture 03/25/22 Negative. Pelvic ultrasound scheduled 04/22/22 per patient. Plan for referral to urology if needed. Previous h/o urolithiasis.  A/P: likely urolithiasis. Recommend appropriate pain management Ibuprofen 600 mg/Tylenol 1000 mg every 6 hours PRN. Increase H2O. OK to use heating pad. Patient instructed to go to Urgent Care or ED if symptoms worsen or has a fever. Patient will follow-up with office Monday to be seen earlier.

## 2022-04-20 NOTE — Telephone Encounter (Signed)
Pt scheduled at [place] for 05/07/2022 @ 315pm.

## 2022-04-22 ENCOUNTER — Other Ambulatory Visit: Payer: BC Managed Care – PPO | Admitting: Obstetrics & Gynecology

## 2022-04-22 ENCOUNTER — Other Ambulatory Visit: Payer: BC Managed Care – PPO

## 2022-05-05 ENCOUNTER — Other Ambulatory Visit: Payer: Self-pay | Admitting: Internal Medicine

## 2022-08-05 ENCOUNTER — Other Ambulatory Visit: Payer: Self-pay | Admitting: Internal Medicine

## 2022-11-23 ENCOUNTER — Other Ambulatory Visit: Payer: Self-pay | Admitting: Obstetrics and Gynecology

## 2022-11-23 DIAGNOSIS — Z1231 Encounter for screening mammogram for malignant neoplasm of breast: Secondary | ICD-10-CM

## 2022-12-15 ENCOUNTER — Ambulatory Visit: Payer: BC Managed Care – PPO

## 2023-01-11 ENCOUNTER — Ambulatory Visit
Admission: RE | Admit: 2023-01-11 | Discharge: 2023-01-11 | Disposition: A | Discharge status: Home / Self Care | Payer: BC Managed Care – PPO | Source: Ambulatory Visit | Attending: Obstetrics and Gynecology

## 2023-01-11 DIAGNOSIS — Z1231 Encounter for screening mammogram for malignant neoplasm of breast: Secondary | ICD-10-CM

## 2023-03-14 ENCOUNTER — Ambulatory Visit: Payer: 59 | Admitting: Internal Medicine

## 2023-03-14 NOTE — Progress Notes (Deleted)
 Patient ID: Samantha Golden, female   DOB: 1966-03-07, 58 y.o.   MRN: [id number]  HPI  Samantha Golden is a 58 y.o.-year-old female, initially referred by Dr Rockney, returning for f/u for uncontrolled Hashimoto's hypothyroidism. Last visit 1 year and 2 months ago.  Interim history: Her brain fog and fatigue improved. She continues to have some insomnia - improved, joint pains - improved, tremors (brother also has these).  She noticed less eyelashes and eyebrows.  Since last visit, she had an abnormal Pap smear.  She had biopsy which showed mild dysplasia (CIN-1). She was also investigated for a mass in her right breast >> biopsy was benign.  Reviewed history: Pt. has been dx with hypothyroidism in ~1990 (college).   Previously on Synthroid  DAW 225 mcg >> 300 mcg >> ... >> Currently on Armour  In 09/2017, she was on Armour 90 mg alternating with 120 mg every other day >> we had to decrease the dose to 90 mg daily.  Subsequent TSH was still slightly abnormal but improved.  Due to her symptoms, we continued the same dose of Armour at that time.  In 04/2019, we decreased the dose of Armour to 75 mg daily.  In 12/2018, we added selenium 200 mcg daily. Not on this now.  She takes Armour 75 mg daily: - in am - fasting - at least  45-60 min from coffee and b'fast - no Ca, Fe, PPIs - not on Biotin On vitamin D .  Reviewed her TFTs: Lab Results  Component Value Date   TSH 2.79 02/23/2022   TSH 3.31 01/08/2022   TSH 2.26 01/02/2021   TSH 2.42 07/03/2020   TSH 2.61 12/27/2019   TSH 2.07 06/01/2019   TSH 0.14 (L) 04/19/2019   TSH 0.266 (L) 03/26/2019   TSH 0.79 09/18/2018   TSH 0.50 04/07/2018   FREET4 0.48 (L) 02/23/2022   FREET4 0.51 (L) 01/02/2021   FREET4 0.45 (L) 07/03/2020   FREET4 0.65 12/27/2019   FREET4 0.51 (L) 06/01/2019   FREET4 0.70 04/19/2019   FREET4 0.98 03/26/2019   FREET4 0.59 (L) 04/07/2018   FREET4 0.62 12/02/2017   FREET4 0.65 10/03/2017  TSH 4.26 in  2012. She has been out of the LT4 for ~ 1 mo before the test in 10/2013.  She had elevated TPO antibodies, confirming Hashimoto's thyroiditis:  Component     Latest Ref Rng & Units 12/27/2019  Thyroperoxidase Ab SerPl-aCnc     <9 IU/mL 272 (H)  Thyroglobulin Ab     < or = 1 IU/mL 5 (H)   Component     Latest Ref Rng & Units 10/23/2013  Thyroperoxidase Ab SerPl-aCnc     <35.0 IU/mL 265.0 (H)  Thyroglobulin Ab     <40.0 IU/mL 38.2   She had palpitations when she took Adderall with the LT4 300 mcg.  Pt denies: - feeling nodules in neck - hoarseness - dysphagia - choking  She also has a history of elevated LFTs and ascites. She had bilateral nephrolithiasis on CT from 01/2020.  ROS: + see HPI  I reviewed pt's medications, allergies, PMH, social hx, family hx, and changes were documented in the history of present illness. Otherwise, unchanged from my initial visit note.  Past Medical History:  Diagnosis Date   Abnormal cervical Papanicolaou smear    ? LASER TREATMENT    Allergy    Anxiety    Asthma    BRCA negative 10/2007   Cancer (HCC)    skin  ca   Cervical dysplasia    Chronic kidney disease    Family history of breast cancer    Family history of colon cancer    Hashimoto's disease    Hypothyroidism    Pneumonia    Past Surgical History:  Procedure Laterality Date   ADENOIDECTOMY     CERVIX LESION DESTRUCTION     COLPOSCOPY     INTRAUTERINE DEVICE (IUD) INSERTION  09/04/2014   Mirena    SKIN CANCER REMOVED  09/2019   History   Social History   Marital Status: Divorced    Spouse Name: N/A    Number of Children: 3   Occupational History   teacher   Social History Main Topics   Smoking status: Former Smoker   Smokeless tobacco: Never Used   Alcohol Use: 1.0 oz/week    2 drink(s) per week   Drug Use: No   Sexual Activity: Yes    Birth Tax Adviser: IUD     Comment: [employer] 08/2012   Current Outpatient Medications on File Prior to Visit   Medication Sig Dispense Refill   ARMOUR THYROID  15 MG tablet TAKE ONE TABLET BY MOUTH DAILY 90 tablet 3   ARMOUR THYROID  60 MG tablet TABLET ONE TABLET ONCE DAILY BEFORE BREAKFAST. 90 tablet 3   azelastine (ASTELIN) 0.1 % nasal spray Place into both nostrils.     fluorouracil (EFUDEX) 5 % cream Apply 1 Application topically 2 (two) times daily. (Patient not taking: Reported on 03/25/2022)     IBU 400 MG tablet Take by mouth.     Vitamin D , Ergocalciferol , (DRISDOL ) 1.25 MG (50000 UNIT) CAPS capsule Take 1 capsule (50,000 Units total) by mouth every 7 (seven) days. 12 capsule 0   No current facility-administered medications on file prior to visit.   Allergies  Allergen Reactions   Latex Shortness Of Breath, Itching and Rash    Wheezing when she removes latex gloves after working, does not use anything latex !    Other     CLEANING MATERIALS (bleach, ammonia)   Amoxicillin-Pot Clavulanate Rash   Family History  Problem Relation Age of Onset   Breast cancer Mother        Age 5   Healthy Father    Alcohol abuse Brother    Cancer Paternal Aunt 22       possible sarcoma   Diabetes Paternal Uncle    Breast cancer Maternal Grandmother        Age 59's   Diabetes Maternal Grandfather    Cancer Paternal Grandmother        vulvar cancer   Pneumonia Paternal Grandfather    Breast cancer Other        MGMs sister   Breast cancer Other        MGMs paternal half sister   Breast cancer Other        PGF's half sister   Cancer Other        PGMs sister with cervical cancer   Colon cancer Neg Hx    Esophageal cancer Neg Hx    Stomach cancer Neg Hx    Pancreatic cancer Neg Hx    Liver disease Neg Hx    Inflammatory bowel disease Neg Hx    PE: LMP  (LMP Unknown) Comment: not SA   Wt Readings from Last 3 Encounters:  02/23/22 151 lb 12.8 oz (68.9 kg)  01/08/22 151 lb (68.5 kg)  01/02/21 148 lb (67.1 kg)   Constitutional: normal weight, in  NAD Eyes: EOMI, no exophthalmos ENT: no  thyromegaly, no cervical lymphadenopathy Cardiovascular: RRR, No MRG Respiratory: CTA B Musculoskeletal: no deformities Skin: moist, warm, no rashes Neurological: + Very faint tremor with outstretched hands  ASSESSMENT: 1. Hashimoto's Hypothyroidism - did not feel good on Synthroid , Levothyroxine   PLAN:  1. Patient with longstanding, uncontrolled, Hashimoto's hypothyroidism, on Armour Thyroid .  She tried generic levothyroxine  in the past and was on brand-name Synthroid  but she did not feel good on these.  She felt better on Armour, but she does continue to have nonspecific symptoms, possibly also related to menopause.  At last visit, she still had chronic fatigue and brain fog, but they were slightly improved.  She also described inability to lose weight, diarrhea alternating with constipation, dry skin, memory problems.  We discussed that in the setting of normal thyroid  tests, the symptoms did not appear to be related to her thyroid  condition.  However, I did suggest selenium again (she used this in the past), to improve her thyroid  antibody titer.  She did not use it consistently since last visit. - latest thyroid  labs reviewed with pt. >> normal: Lab Results  Component Value Date   TSH 2.79 02/23/2022  - she continues on Armour 75 mg daily - pt feels good on this dose, but noticed less hair in her eyebrows and also loss of lashes.  She also has some symptoms which could be attributed to menopause like insomnia, joint aches -however, these worsen when she eats gluten.  We did discuss about possibly adding selenium 200 mcg daily to help with decreasing thyroid  inflammation and antibody titer. - we discussed about taking the thyroid  hormone every day, with water, >30 minutes before breakfast, separated by >4 hours from acid reflux medications, calcium, iron, multivitamins. Pt. is taking it correctly. - will check thyroid  tests today: TSH, free T3 and fT4 and change the Armour dose accordingly.    - If labs are abnormal, she will need to return for repeat TFTs in 1.5 months - I will see her back in a year  - Total time spent for the visit: 25 min, in precharting, post charting, obtaining medical information from the chart and from the pt, reviewing her  previous labs, evaluations, and treatments, reviewing her symptoms, counseling her about her thyroid  condition (please see the discussed topics above), and developing a plan to further investigate and treat it; she had a number of questions which I addressed.  Component     Latest Ref Rng 02/23/2022  TSH     0.35 - 5.50 uIU/mL 2.79   Triiodothyronine,Free,Serum     2.3 - 4.2 pg/mL 4.0   T4,Free(Direct)     0.60 - 1.60 ng/dL 9.51 (L)   Thyroid  tests are at goal.  Lela Fendt, MD PhD [facility] Endocrinology

## 2023-04-08 ENCOUNTER — Ambulatory Visit: Payer: 59 | Admitting: Internal Medicine

## 2023-04-08 NOTE — Progress Notes (Deleted)
 Patient ID: Samantha Golden, female   DOB: 17-Dec-1965, 58 y.o.   MRN: [id number] This note was precharted 03/14/2023.  HPI  Samantha Golden is a 58 y.o.-year-old female, initially referred by Dr Audie Box, returning for f/u for uncontrolled Hashimoto's hypothyroidism. Last visit 1 year and 3 months ago.  Interim history: Her brain fog and fatigue improved. She continues to have some insomnia - improved, joint pains - improved, tremors (possibly familial, since brother also has this). She was also investigated for a mass in her right breast >> biopsy was benign.  Reviewed history: Pt. has been dx with hypothyroidism in ~1990 (college).   Previously on Synthroid DAW 225 mcg >> 300 mcg >> ... >> Currently on Armour  In 09/2017, she was on Armour 90 mg alternating with 120 mg every other day >> we had to decrease the dose to 90 mg daily.  Subsequent TSH was still slightly abnormal but improved.  Due to her symptoms, we continued the same dose of Armour at that time.  In 04/2019, we decreased the dose of Armour to 75 mg daily.  In 12/2018, we added selenium 200 mcg daily. Not on this now.  She takes Armour 75 mg daily: - in am - fasting - at least  45-60 min from coffee and b'fast - no Ca, Fe, PPIs - not on Biotin On vitamin D.  Reviewed her TFTs: Lab Results  Component Value Date   TSH 2.79 02/23/2022   TSH 3.31 01/08/2022   TSH 2.26 01/02/2021   TSH 2.42 07/03/2020   TSH 2.61 12/27/2019   TSH 2.07 06/01/2019   TSH 0.14 (L) 04/19/2019   TSH 0.266 (L) 03/26/2019   TSH 0.79 09/18/2018   TSH 0.50 04/07/2018   FREET4 0.48 (L) 02/23/2022   FREET4 0.51 (L) 01/02/2021   FREET4 0.45 (L) 07/03/2020   FREET4 0.65 12/27/2019   FREET4 0.51 (L) 06/01/2019   FREET4 0.70 04/19/2019   FREET4 0.98 03/26/2019   FREET4 0.59 (L) 04/07/2018   FREET4 0.62 12/02/2017   FREET4 0.65 10/03/2017   She had elevated TPO antibodies, confirming Hashimoto's thyroiditis: Component     Latest Ref Rng &  Units 12/27/2019  Thyroperoxidase Ab SerPl-aCnc     <9 IU/mL 272 (H)  Thyroglobulin Ab     < or = 1 IU/mL 5 (H)   Component     Latest Ref Rng & Units 10/23/2013  Thyroperoxidase Ab SerPl-aCnc     <35.0 IU/mL 265.0 (H)  Thyroglobulin Ab     <40.0 IU/mL 38.2   She had palpitations when she took Adderall with the LT4 300 mcg.  Pt denies: - feeling nodules in neck - hoarseness - dysphagia - choking  She also has a history of elevated LFTs and ascites. She had bilateral nephrolithiasis on CT from 01/2020. In 2023, she had an abnormal Pap smear: mild dysplasia (CIN-1).  ROS: + see HPI  I reviewed pt's medications, allergies, PMH, social hx, family hx, and changes were documented in the history of present illness. Otherwise, unchanged from my initial visit note.  Past Medical History:  Diagnosis Date   Abnormal cervical Papanicolaou smear    ? LASER TREATMENT    Allergy    Anxiety    Asthma    BRCA negative 10/2007   Cancer (HCC)    skin ca   Cervical dysplasia    Chronic kidney disease    Family history of breast cancer    Family history of colon cancer  Hashimoto's disease    Hypothyroidism    Pneumonia    Past Surgical History:  Procedure Laterality Date   ADENOIDECTOMY     CERVIX LESION DESTRUCTION     COLPOSCOPY     INTRAUTERINE DEVICE (IUD) INSERTION  09/04/2014   Mirena   SKIN CANCER REMOVED  09/2019   History   Social History   Marital Status: Divorced    Spouse Name: N/A    Number of Children: 3   Occupational History   teacher   Social History Main Topics   Smoking status: Former Smoker   Smokeless tobacco: Never Used   Alcohol Use: 1.0 oz/week    2 drink(s) per week   Drug Use: No   Sexual Activity: Yes    Birth Tax adviser: IUD     Comment: Para[employer]ard 08/2012   Current Outpatient Medications on File Prior to Visit  Medication Sig Dispense Refill   ARMOUR THYROID 15 MG tablet TAKE ONE TABLET BY MOUTH DAILY 90 tablet 3   ARMOUR  THYROID 60 MG tablet TABLET ONE TABLET ONCE DAILY BEFORE BREAKFAST. 90 tablet 3   azelastine (ASTELIN) 0.1 % nasal spray Place into both nostrils.     fluorouracil (EFUDEX) 5 % cream Apply 1 Application topically 2 (two) times daily. (Patient not taking: Reported on 03/25/2022)     IBU 400 MG tablet Take by mouth.     Vitamin D, Ergocalciferol, (DRISDOL) 1.25 MG (50000 UNIT) CAPS capsule Take 1 capsule (50,000 Units total) by mouth every 7 (seven) days. 12 capsule 0   No current facility-administered medications on file prior to visit.   Allergies  Allergen Reactions   Latex Shortness Of Breath, Itching and Rash    Wheezing when she removes latex gloves after working, does not use anything latex !    Other     "CLEANING MATERIALS" (bleach, ammonia)   Amoxicillin-Pot Clavulanate Rash   Family History  Problem Relation Age of Onset   Breast cancer Mother        Age 35   Healthy Father    Alcohol abuse Brother    Cancer Paternal Aunt 22       possible sarcoma   Diabetes Paternal Uncle    Breast cancer Maternal Grandmother        Age 30's   Diabetes Maternal Grandfather    Cancer Paternal Grandmother        vulvar cancer   Pneumonia Paternal Grandfather    Breast cancer Other        MGMs sister   Breast cancer Other        MGMs paternal half sister   Breast cancer Other        PGF's half sister   Cancer Other        PGMs sister with cervical cancer   Colon cancer Neg Hx    Esophageal cancer Neg Hx    Stomach cancer Neg Hx    Pancreatic cancer Neg Hx    Liver disease Neg Hx    Inflammatory bowel disease Neg Hx    PE: LMP  (LMP Unknown) Comment: not SA   Wt Readings from Last 3 Encounters:  02/23/22 151 lb 12.8 oz (68.9 kg)  01/08/22 151 lb (68.5 kg)  01/02/21 148 lb (67.1 kg)   Constitutional: normal weight, in NAD Eyes: EOMI, no exophthalmos ENT: no thyromegaly, no cervical lymphadenopathy Cardiovascular: RRR, No MRG Respiratory: CTA B Musculoskeletal: no  deformities Skin: moist, warm, no rashes Neurological: + Very  faint tremor with outstretched hands  ASSESSMENT: 1. Hashimoto's Hypothyroidism - did not feel good on Synthroid, Levothyroxine  PLAN:  1. Patient with longstanding, uncontrolled, Hashimoto's hypothyroidism, on Armour Thyroid.  She did not feel good on generic levothyroxine and brand-name Synthroid in the past.  She feels better on Armour but she continues to have nonspecific symptoms, possibly also related to menopause.  At last visit fatigue and brain fog were slightly improved.  She still had inability to lose weight, diarrhea alternating with constipation, dry skin, memory problems, also hair loss from eyebrows and eyelashes. -We had her on selenium before but she came off -at last visit we discussed about possibly restarting 200 mcg daily to help with decreasing thyroid inflammation antibody titer. - latest thyroid labs reviewed with pt. >> normal: Lab Results  Component Value Date   TSH 2.79 02/23/2022  - she continues on Armour 75 mg daily - pt feels good on this dose, but the above symptoms still persist - we discussed about taking the thyroid hormone every day, with water, >30 minutes before breakfast, separated by >4 hours from acid reflux medications, calcium, iron, multivitamins. Pt. is taking it correctly. - will check thyroid tests today: TSH, fT3 and fT4 - If labs are abnormal, she will need to return for repeat TFTs in 1.5 months - I will see her back in a year  Samantha Pavlov, MD PhD [facility] Endocr[facility]

## 2023-04-29 ENCOUNTER — Encounter: Payer: Self-pay | Admitting: Internal Medicine

## 2023-04-29 ENCOUNTER — Ambulatory Visit: Payer: 59 | Admitting: Internal Medicine

## 2023-04-29 VITALS — BP 112/64 | HR 77 | Ht 65.5 in | Wt 136.4 lb

## 2023-04-29 DIAGNOSIS — E063 Autoimmune thyroiditis: Secondary | ICD-10-CM | POA: Diagnosis not present

## 2023-04-29 NOTE — Patient Instructions (Addendum)
Please stop at the lab.  Please continue Armour 75 daily.  Take the thyroid hormone every day, with water, at least 30 minutes before breakfast, separated by at least 4 hours from: - acid reflux medications - calcium - iron - multivitamins  Please return in 1 year.

## 2023-04-29 NOTE — Progress Notes (Addendum)
 Patient ID: Samantha Golden, female   DOB: 1965/04/28, 58 y.o.   MRN: [id number] This note was precharted 1/6 and 31/2025.  HPI  Samantha Golden is a 58 y.o.-year-old female, initially referred by Dr Audie Box, returning for f/u for uncontrolled Hashimoto's hypothyroidism. Last visit 1 year and 3 months ago.  Interim history: Her brain fog and fatigue improved and she is feeling well, but has tremors (possibly familial, since brother also has this). She was also investigated for a mass in her right breast >> biopsy was benign. She lost 15 lbs since last OV - cut out sugary cocktails!  Also, she is reducing carbs.  Reviewed history: Pt. has been dx with hypothyroidism in ~1990 (college).   Previously on Synthroid DAW 225 mcg >> 300 mcg >> ... >> Currently on Armour  In 09/2017, she was on Armour 90 mg alternating with 120 mg every other day >> we had to decrease the dose to 90 mg daily.  Subsequent TSH was still slightly abnormal but improved.  Due to her symptoms, we continued the same dose of Armour at that time.  In 04/2019, we decreased the dose of Armour to 75 mg daily.  In 12/2018, we added selenium 200 mcg daily. Not on this now.  She takes Armour 75 mg daily: - in am - fasting - at least  60 min from coffee and b'fast - no Ca, Fe, PPIs - not on Biotin On vitamin D - inconsistently. She puts collagen in her coffee.  Reviewed her TFTs: Lab Results  Component Value Date   TSH 2.79 02/23/2022   TSH 3.31 01/08/2022   TSH 2.26 01/02/2021   TSH 2.42 07/03/2020   TSH 2.61 12/27/2019   TSH 2.07 06/01/2019   TSH 0.14 (L) 04/19/2019   TSH 0.266 (L) 03/26/2019   TSH 0.79 09/18/2018   TSH 0.50 04/07/2018   FREET4 0.48 (L) 02/23/2022   FREET4 0.51 (L) 01/02/2021   FREET4 0.45 (L) 07/03/2020   FREET4 0.65 12/27/2019   FREET4 0.51 (L) 06/01/2019   FREET4 0.70 04/19/2019   FREET4 0.98 03/26/2019   FREET4 0.59 (L) 04/07/2018   FREET4 0.62 12/02/2017   FREET4 0.65 10/03/2017    She had elevated TPO antibodies, confirming Hashimoto's thyroiditis: Component     Latest Ref Rng & Units 12/27/2019  Thyroperoxidase Ab SerPl-aCnc     <9 IU/mL 272 (H)  Thyroglobulin Ab     < or = 1 IU/mL 5 (H)   Component     Latest Ref Rng & Units 10/23/2013  Thyroperoxidase Ab SerPl-aCnc     <35.0 IU/mL 265.0 (H)  Thyroglobulin Ab     <40.0 IU/mL 38.2   She had palpitations when she took Adderall with the LT4 300 mcg.  Pt denies: - feeling nodules in neck - hoarseness - dysphagia - choking  She also has a history of elevated LFTs and ascites. She had bilateral nephrolithiasis on CT from 01/2020. In 2023, she had an abnormal Pap smear: mild dysplasia (CIN-1).  ROS: + see HPI  I reviewed pt's medications, allergies, PMH, social hx, family hx, and changes were documented in the history of present illness. Otherwise, unchanged from my initial visit note.  Past Medical History:  Diagnosis Date   Abnormal cervical Papanicolaou smear    ? LASER TREATMENT    Allergy    Anxiety    Asthma    BRCA negative 10/2007   Cancer (HCC)    skin ca   Cervical dysplasia  Chronic kidney disease    Family history of breast cancer    Family history of colon cancer    Hashimoto's disease    Hypothyroidism    Pneumonia    Past Surgical History:  Procedure Laterality Date   ADENOIDECTOMY     CERVIX LESION DESTRUCTION     COLPOSCOPY     INTRAUTERINE DEVICE (IUD) INSERTION  09/04/2014   Mirena   SKIN CANCER REMOVED  09/2019   History   Social History   Marital Status: Divorced    Spouse Name: N/A    Number of Children: 3   Occupational History   teacher   Social History Main Topics   Smoking status: Former Smoker   Smokeless tobacco: Never Used   Alcohol Use: 1.0 oz/week    2 drink(s) per week   Drug Use: No   Sexual Activity: Yes    Birth Tax adviser: IUD     Comment: ParaGard 08/2012   Current Outpatient Medications on File Prior to Visit   Medication Sig Dispense Refill   ARMOUR THYROID 15 MG tablet TAKE ONE TABLET BY MOUTH DAILY 90 tablet 3   ARMOUR THYROID 60 MG tablet TABLET ONE TABLET ONCE DAILY BEFORE BREAKFAST. 90 tablet 3   azelastine (ASTELIN) 0.1 % nasal spray Place into both nostrils.     fluorouracil (EFUDEX) 5 % cream Apply 1 Application topically 2 (two) times daily. (Patient not taking: Reported on 03/25/2022)     IBU 400 MG tablet Take by mouth.     Vitamin D, Ergocalciferol, (DRISDOL) 1.25 MG (50000 UNIT) CAPS capsule Take 1 capsule (50,000 Units total) by mouth every 7 (seven) days. 12 capsule 0   No current facility-administered medications on file prior to visit.   Allergies  Allergen Reactions   Latex Shortness Of Breath, Itching and Rash    Wheezing when she removes latex gloves after working, does not use anything latex !    Other     "CLEANING MATERIALS" (bleach, ammonia)   Amoxicillin-Pot Clavulanate Rash   Family History  Problem Relation Age of Onset   Breast cancer Mother        Age 26   Healthy Father    Alcohol abuse Brother    Cancer Paternal Aunt 22       possible sarcoma   Diabetes Paternal Uncle    Breast cancer Maternal Grandmother        Age 70's   Diabetes Maternal Grandfather    Cancer Paternal Grandmother        vulvar cancer   Pneumonia Paternal Grandfather    Breast cancer Other        MGMs sister   Breast cancer Other        MGMs paternal half sister   Breast cancer Other        PGF's half sister   Cancer Other        PGMs sister with cervical cancer   Colon cancer Neg Hx    Esophageal cancer Neg Hx    Stomach cancer Neg Hx    Pancreatic cancer Neg Hx    Liver disease Neg Hx    Inflammatory bowel disease Neg Hx    PE: BP 112/64   Pulse 77   Ht 5' 5.5" (1.664 m)   Wt 136 lb 6.4 oz (61.9 kg)   LMP  (LMP Unknown) Comment: not SA  SpO2 98%   BMI 22.35 kg/m    Wt Readings from Last 3 Encounters:  04/29/23 136 lb 6.4 oz (61.9 kg)  02/23/22 151 lb 12.8 oz  (68.9 kg)  01/08/22 151 lb (68.5 kg)   Constitutional: thin, in NAD Eyes: EOMI, no exophthalmos ENT: no thyromegaly, no cervical lymphadenopathy Cardiovascular: RRR, No MRG Respiratory: CTA B Musculoskeletal: no deformities Skin: no rashes Neurological: + tremor with outstretched hands  ASSESSMENT: 1. Hashimoto's Hypothyroidism - did not feel good on Synthroid, Levothyroxine  PLAN:  1. Patient with long history of Hashimoto's hypothyroidism, on desiccated thyroid extract.  She did not feel good on generic levothyroxine and brand-name Synthroid in the past.  She feels better on Armour but she continues to have nonspecific symptoms, possibly also related to menopause.  At last visit fatigue and brain fog were slightly improved.  She still has inability to lose weight, diarrhea alternating with constipation, dry skin, memory problems, hair loss from eyebrows and eyelashes -We had her on selenium before but she came off - at last visit we discussed about possibly restarting 200 mcg daily to help with decreasing thyroid inflammation antibody titer.  She did not start since last visit but is interested in restarting it. - latest thyroid labs reviewed with pt. >> normal: Lab Results  Component Value Date   TSH 2.79 02/23/2022  - she continues on Armour 75 mg daily - pt feels good on this dose but we discussed that we may need to reduce the dose now that she lost such significant amount of weight - we discussed about taking the thyroid hormone every day, with water, >30 minutes before breakfast, separated by >4 hours from acid reflux medications, calcium, iron, multivitamins. Pt. is taking it correctly. - will check thyroid tests today: TSH, fT3, and fT4 - If labs are abnormal, she will need to return for repeat TFTs in 1.5 months  She needs refills of Armour.  Orders Placed This Encounter  Procedures   TSH   T4, free   T3, free   Component     Latest Ref Rng 04/29/2023  TSH     mIU/L  3.29   T4,Free(Direct)     0.8 - 1.8 ng/dL 0.8   Triiodothyronine,Free,Serum     2.3 - 4.2 pg/mL 3.5   Normal TFTs.  Carlus Pavlov, MD PhD [facility] Endocr[facility]

## 2023-04-30 LAB — T3, FREE: T3, Free: 3.5 pg/mL (ref 2.3–4.2)

## 2023-04-30 LAB — TSH: TSH: 3.29 m[IU]/L

## 2023-04-30 LAB — T4, FREE: Free T4: 0.8 ng/dL (ref 0.8–1.8)

## 2023-05-03 ENCOUNTER — Encounter: Payer: Self-pay | Admitting: Internal Medicine

## 2023-05-03 MED ORDER — ARMOUR THYROID 15 MG PO TABS: 15.0000 mg | ORAL_TABLET | Freq: Every day | ORAL | 3 refills | Status: AC

## 2023-05-03 MED ORDER — ARMOUR THYROID 60 MG PO TABS: ORAL_TABLET | ORAL | 3 refills | Status: AC

## 2023-05-03 NOTE — Addendum Note (Signed)
 Addended by: Carlus Pavlov on: 05/03/2023 11:21 AM   Modules accepted: Orders

## 2024-04-30 ENCOUNTER — Ambulatory Visit: Payer: 59 | Admitting: Internal Medicine
